# Patient Record
Sex: Male | Born: 1960
Health system: Southern US, Community
[De-identification: ages and names within clinical notes are randomized; demographics above are authoritative.]

## PROBLEM LIST (undated history)

## (undated) DIAGNOSIS — Z972 Presence of dental prosthetic device (complete) (partial): Secondary | ICD-10-CM

## (undated) DIAGNOSIS — F419 Anxiety disorder, unspecified: Secondary | ICD-10-CM

## (undated) DIAGNOSIS — I2510 Atherosclerotic heart disease of native coronary artery without angina pectoris: Secondary | ICD-10-CM

## (undated) DIAGNOSIS — I219 Acute myocardial infarction, unspecified: Secondary | ICD-10-CM

## (undated) DIAGNOSIS — I714 Abdominal aortic aneurysm, without rupture, unspecified: Secondary | ICD-10-CM

## (undated) DIAGNOSIS — E119 Type 2 diabetes mellitus without complications: Secondary | ICD-10-CM

## (undated) DIAGNOSIS — K219 Gastro-esophageal reflux disease without esophagitis: Secondary | ICD-10-CM

## (undated) DIAGNOSIS — I252 Old myocardial infarction: Secondary | ICD-10-CM

## (undated) DIAGNOSIS — Z6841 Body Mass Index (BMI) 40.0 and over, adult: Secondary | ICD-10-CM

## (undated) DIAGNOSIS — Z8679 Personal history of other diseases of the circulatory system: Secondary | ICD-10-CM

## (undated) DIAGNOSIS — E785 Hyperlipidemia, unspecified: Secondary | ICD-10-CM

## (undated) DIAGNOSIS — F32A Depression, unspecified: Secondary | ICD-10-CM

## (undated) DIAGNOSIS — G510 Bell's palsy: Secondary | ICD-10-CM

## (undated) DIAGNOSIS — I739 Peripheral vascular disease, unspecified: Secondary | ICD-10-CM

## (undated) DIAGNOSIS — M1990 Unspecified osteoarthritis, unspecified site: Secondary | ICD-10-CM

## (undated) DIAGNOSIS — Z973 Presence of spectacles and contact lenses: Secondary | ICD-10-CM

## (undated) DIAGNOSIS — Z794 Long term (current) use of insulin: Secondary | ICD-10-CM

## (undated) DIAGNOSIS — G4733 Obstructive sleep apnea (adult) (pediatric): Secondary | ICD-10-CM

## (undated) DIAGNOSIS — G894 Chronic pain syndrome: Secondary | ICD-10-CM

## (undated) DIAGNOSIS — Z9989 Dependence on other enabling machines and devices: Secondary | ICD-10-CM

## (undated) DIAGNOSIS — I10 Essential (primary) hypertension: Secondary | ICD-10-CM

## (undated) HISTORY — DX: Hyperlipidemia, unspecified: E78.5

## (undated) HISTORY — PX: DESCENDING AORTIC ANEURYSM REPAIR W/ STENT: SHX1456

---

## 2008-09-21 DIAGNOSIS — Z8679 Personal history of other diseases of the circulatory system: Secondary | ICD-10-CM

## 2008-09-21 HISTORY — DX: Personal history of other diseases of the circulatory system: Z86.79

## 2009-10-28 ENCOUNTER — Emergency Department (HOSPITAL_COMMUNITY): Admission: EM | Admit: 2009-10-28 | Discharge: 2009-10-28 | Payer: Self-pay | Admitting: Emergency Medicine

## 2009-11-28 ENCOUNTER — Emergency Department (HOSPITAL_COMMUNITY): Admission: EM | Admit: 2009-11-28 | Discharge: 2009-11-28 | Payer: Self-pay | Admitting: Emergency Medicine

## 2009-12-10 ENCOUNTER — Ambulatory Visit: Payer: Self-pay | Admitting: Family Medicine

## 2009-12-10 DIAGNOSIS — I10 Essential (primary) hypertension: Secondary | ICD-10-CM | POA: Insufficient documentation

## 2009-12-10 DIAGNOSIS — I719 Aortic aneurysm of unspecified site, without rupture: Secondary | ICD-10-CM | POA: Insufficient documentation

## 2009-12-10 DIAGNOSIS — G5620 Lesion of ulnar nerve, unspecified upper limb: Secondary | ICD-10-CM | POA: Insufficient documentation

## 2009-12-10 DIAGNOSIS — F329 Major depressive disorder, single episode, unspecified: Secondary | ICD-10-CM

## 2009-12-10 DIAGNOSIS — Z9889 Other specified postprocedural states: Secondary | ICD-10-CM | POA: Insufficient documentation

## 2009-12-10 DIAGNOSIS — F3289 Other specified depressive episodes: Secondary | ICD-10-CM | POA: Insufficient documentation

## 2009-12-10 DIAGNOSIS — IMO0002 Reserved for concepts with insufficient information to code with codable children: Secondary | ICD-10-CM | POA: Insufficient documentation

## 2009-12-11 DIAGNOSIS — J329 Chronic sinusitis, unspecified: Secondary | ICD-10-CM | POA: Insufficient documentation

## 2009-12-11 DIAGNOSIS — N529 Male erectile dysfunction, unspecified: Secondary | ICD-10-CM | POA: Insufficient documentation

## 2009-12-24 ENCOUNTER — Ambulatory Visit: Payer: Self-pay | Admitting: Internal Medicine

## 2009-12-24 LAB — CONVERTED CEMR LAB
ALT: 29 units/L (ref 0–53)
AST: 26 units/L (ref 0–37)
BUN: 16 mg/dL (ref 6–23)
CO2: 20 meq/L (ref 19–32)
Creatinine, Ser: 1.09 mg/dL (ref 0.40–1.50)
Eosinophils Absolute: 0.3 10*3/uL (ref 0.0–0.7)
Eosinophils Relative: 4 % (ref 0–5)
HCV Ab: NEGATIVE
Hep B S Ab: NEGATIVE
MCV: 91.1 fL (ref 78.0–100.0)
Monocytes Absolute: 0.4 10*3/uL (ref 0.1–1.0)
Monocytes Relative: 7 % (ref 3–12)
Platelets: 240 10*3/uL (ref 150–400)
Potassium: 4.5 meq/L (ref 3.5–5.3)
RDW: 13.9 % (ref 11.5–15.5)
Sodium: 138 meq/L (ref 135–145)
Total Bilirubin: 0.4 mg/dL (ref 0.3–1.2)

## 2010-01-17 ENCOUNTER — Ambulatory Visit: Payer: Self-pay | Admitting: Cardiothoracic Surgery

## 2010-01-21 ENCOUNTER — Encounter (INDEPENDENT_AMBULATORY_CARE_PROVIDER_SITE_OTHER): Payer: Self-pay | Admitting: Family Medicine

## 2010-01-23 ENCOUNTER — Ambulatory Visit: Payer: Self-pay | Admitting: Vascular Surgery

## 2010-03-13 ENCOUNTER — Telehealth (INDEPENDENT_AMBULATORY_CARE_PROVIDER_SITE_OTHER): Payer: Self-pay | Admitting: Family Medicine

## 2010-03-25 ENCOUNTER — Ambulatory Visit: Payer: Self-pay | Admitting: Family Medicine

## 2010-04-17 ENCOUNTER — Ambulatory Visit: Payer: Self-pay | Admitting: Cardiothoracic Surgery

## 2010-04-29 ENCOUNTER — Encounter (HOSPITAL_COMMUNITY): Admission: RE | Admit: 2010-04-29 | Discharge: 2010-06-21 | Payer: Self-pay | Admitting: Cardiology

## 2010-06-18 ENCOUNTER — Ambulatory Visit: Payer: Self-pay | Admitting: Internal Medicine

## 2010-07-18 ENCOUNTER — Ambulatory Visit: Payer: Self-pay | Admitting: Cardiothoracic Surgery

## 2010-07-18 ENCOUNTER — Encounter: Admission: RE | Admit: 2010-07-18 | Discharge: 2010-07-18 | Payer: Self-pay | Admitting: Cardiothoracic Surgery

## 2010-10-02 ENCOUNTER — Ambulatory Visit: Payer: Self-pay | Admitting: Cardiothoracic Surgery

## 2010-10-23 ENCOUNTER — Emergency Department (HOSPITAL_COMMUNITY): Admission: EM | Admit: 2010-10-23 | Discharge: 2010-10-23 | Payer: Self-pay | Admitting: Emergency Medicine

## 2010-11-25 ENCOUNTER — Encounter
Admission: RE | Admit: 2010-11-25 | Discharge: 2011-01-21 | Payer: Self-pay | Source: Home / Self Care | Attending: Family Medicine | Admitting: Family Medicine

## 2011-01-21 NOTE — Progress Notes (Signed)
Summary: Office Visit/DEPRESSION SCREENING  Office Visit/DEPRESSION SCREENING   Imported By: Arta Bruce 01/17/2010 12:39:47  _____________________________________________________________________  External Attachment:    Type:   Image     Comment:   External Document

## 2011-01-21 NOTE — Progress Notes (Signed)
Summary: nasal congestion  Phone Note Call from Patient   Summary of Call: Pt. complains of sinus congestion, afebrile, has been taking Sudafed without refief, encouraged to keep windows down @ home and up in the car, shower and wash hair after being outside for a period of time. May use saline nasal spray, netti pot, take showers and allow warm water to spray on sinus area, Drink plenty of water stop taking Sudfed pt has hx HTN. Instructed if no improvement or symptoms get worse call for appt.  Initial call taken by: Gaylyn Cheers RN,  March 13, 2010 4:28 PM

## 2011-01-21 NOTE — Letter (Signed)
Summary: CT REPORT/[facility]  CT REPORT/[facility]   Imported By: Leodis Rains 01/21/2010 16:56:12  _____________________________________________________________________  External Attachment:    Type:   Image     Comment:   External Document

## 2011-01-21 NOTE — Letter (Signed)
Summary: PT INFORMATION SHEET  PT INFORMATION SHEET   Imported By: Arta Bruce 01/17/2010 12:47:27  _____________________________________________________________________  External Attachment:    Type:   Image     Comment:   External Document

## 2011-01-21 NOTE — Letter (Signed)
Summary: SCALING SCALE FEE  SCALING SCALE FEE   Imported By: Arta Bruce 01/17/2010 12:42:08  _____________________________________________________________________  External Attachment:    Type:   Image     Comment:   External Document

## 2011-01-21 NOTE — Letter (Signed)
Summary: REFERRAL//PSYCHOLOGY/[facility]//APT DATE & TIME  REFERRAL//PSYCHOLOGY/[facility]//APT DATE & TIME   Imported By: Arta Bruce 01/28/2010 14:16:45  _____________________________________________________________________  External Attachment:    Type:   Image     Comment:   External Document

## 2011-03-26 LAB — POCT CARDIAC MARKERS: Troponin i, poc: <0.05 ng/mL (ref 0.00–0.09)

## 2011-03-26 LAB — POCT I-STAT, CHEM 8
BUN: 10 mg/dL (ref 6–23)
Calcium, Ion: 1.1 mmol/L — ABNORMAL LOW (ref 1.12–1.32)
Creatinine, Ser: 1.1 mg/dL (ref 0.4–1.5)
Glucose, Bld: 90 mg/dL (ref 70–99)
HCT: 42 % (ref 39.0–52.0)
Hemoglobin: 14.3 g/dL (ref 13.0–17.0)
Potassium: 3.8 mEq/L (ref 3.5–5.1)
Sodium: 139 mEq/L (ref 135–145)

## 2011-03-26 LAB — DIFFERENTIAL
Basophils Absolute: 0 10*3/uL (ref 0.0–0.1)
Basophils Relative: 1 % (ref 0–1)
Eosinophils Relative: 4 % (ref 0–5)
Lymphocytes Relative: 34 % (ref 12–46)
Lymphs Abs: 2.1 10*3/uL (ref 0.7–4.0)
Monocytes Absolute: 0.5 10*3/uL (ref 0.1–1.0)
Monocytes Relative: 8 % (ref 3–12)

## 2011-03-26 LAB — PROTIME-INR: INR: 0.96 (ref 0.00–1.49)

## 2011-03-26 LAB — CBC
MCHC: 34.3 g/dL (ref 30.0–36.0)
MCV: 92.6 fL (ref 78.0–100.0)
WBC: 6.3 10*3/uL (ref 4.0–10.5)

## 2011-05-06 NOTE — Assessment & Plan Note (Signed)
OFFICE VISIT   Bryan Wells, Bryan Wells  DOB:  28-Jun-1961                                        July 18, 2010  CHART #:  [id number]   HISTORY:  The patient returns with a 12-month followup CTA of the chest,  abdomen, and pelvis.  The patient originally presented to me in January  2011, after he had moved to a town.  Previously, had been treated by Dr.  Dannielle Huh in October.  At that time, he had presented with a type 3 aortic  dissection starting at the left subclavian and presented with a cold  left leg.  Dr. Dannielle Huh had placed stents in both iliacs.  He had had  intermittent followup since that time.  The patient comes in today for a  followup CT of his chronic aortic dissection.  His primary complaints  now are that he gets weak in both legs with ambulation and cramping.  Since last seen, he was referred to Cardiology, had a Cardiolite stress  test showed a normal ejection fraction without evidence of ischemia.  Vascular studies were done in February 2011, which showed ABIs on the  left of 0.8 and on the right of 0.7.   PHYSICAL EXAMINATION:  General:  The patient's blood pressure is 139/84,  pulse is 80, respiratory rate is 18, O2 sats 96%.  Lungs:  Clear  bilaterally.  Cardiac:  Regular rate and rhythm.  Abdomen:  Benign.  He  has bilateral palpable femoral pulses, pedal and posterior tibial pulses  are very weakly palpable bilaterally.   A CT scan of the chest shows a chronic aortic dissection originating at  the left subclavian spiralling down into the abdomen.  His bilateral  common iliacs stents are present.  He also has dilatation of his distal  abdominal aorta 43 x 39 mm.  The thoracic aorta was not significantly  enlarged.   IMPRESSION:  The patient was stable, descending thoracic aortic  dissection with ongoing question of claudication in the lower  extremities.  Discuss the findings of the CT with the patient and  stressed the importance of followup CT scans  in the future, in case  there is progressive enlargement of his thoracic aorta.  In addition, he  did have Doppler studies done in January, but I have made him  appointment for followup visit with the vascular surgeons in regard to  his iliac  stents and continued complaints of claudication in the lower  extremities.  Plan to see him back in 1 year with a CTA of the aorta.   Sheliah Plane, MD  Electronically Signed   EG/MEDQ  D:  07/18/2010  T:  07/19/2010  Job:  [id number]   cc:   Francisca December, M.D.

## 2011-05-06 NOTE — Consult Note (Signed)
NEW PATIENT CONSULTATION   Bryan Wells, Bryan Wells  DOB:  1961/01/26                                        January 17, 2010  CHART #:  [id number]   HISTORY:  The patient comes in today after recently moving to  [facility].  He had been seen at [facility] by Dr. Carolyne Fiscal.  The patient  is referred because of shortness of breath caused by an aneurysm.  The  patient does have a significant medical history, but unfortunately does  not have an aneurysm.  His history is complicated.  He presented to Dr.  Randa Evens at [facility] in early October 2009 with a descending  aortic dissection, presented with a cold left leg.  The patient  underwent stenting of the abdominal aorta and bilateral iliacs, had a  complicated course requiring tracheostomy and ventilation, but  ultimately recovered and saw Dr. Samule Ohm in followup.  From the notes, it  is a little unclear exactly where the dissection started and  unfortunately we do not have the films.  The best description noted that  it was a type B aortic dissection beginning just distal to the origin of  the innominate artery.  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  He was seen back by Dr. Samule Ohm at the end of December  2009.  At that time, he was doing well and he was going to see him for  followup CT in 6 months after that.  The patient did not have an  aneurysm noted, but an aortic dissection.  He continues to have some  weakness of his legs, fatigue in his legs with walking, and has been  unable to work.  He notes that he has been taking his blood pressure  medications.   PAST SURGICAL HISTORY:  Tracheostomy.   FAMILY HISTORY:  Father died at age 32 with diabetes, mother died of  bone cancer at age 26, brother died at age 33 with HIV, another brother  died at age 58 with end-stage renal disease and hypertension.  Two other  siblings are living, 3 children alive and well.   SOCIAL HISTORY:  The patient  applied for disability, had been turned  down, previously worked in [employer].  Lives with his cousin in  [place], divorced, former smoker, quit drinking alcohol in 1997.  Denies drug use.   PHYSICAL EXAMINATION:  General:  The patient is an obese black male, in  no distress.  Vital Signs:  His blood pressure 140/84 in the right arm,  pulse is 77, respiratory rate is 18, and O2 sats 96%.  Lungs:  The  patient has distant breath sounds bilaterally.  I do not appreciate any  murmur of aortic insufficiency.  Abdomen:  Moderately obese, unable to  palpate the abdominal aorta.  Extremities:  He has 2+ radial pulses  bilaterally and equal, 1+ DP and PT pulses bilaterally.   A CT scan done on the patient at [facility] in the fall of 2010 shows  that the maximum dilatation of the descending aorta is about 3 cm and  suggests there is a persistent flap, but no definite aneurysmal  dilatation.  Unfortunately, this study was done to rule out pulmonary  embolus, does not show the abdomen at all and the timing of the contrast  is  not adequate to fully evaluate the aorta.   IMPRESSION:  At this point, the patient probably has a persistent flap  in the descending aorta with a stent graft in the abdominal aorta.  Followup scan would both evaluate any progressive enlargement of the  thoracic aorta and also confirm the proper seating of the iliac stent  grafts.  At this point, I have stressed to the patient the importance of  his good blood pressure control.  He currently continues on diltiazem ER  120 mg 2 tablets twice a day, labetalol 100 mg 2 tablets 3 times a day,  lisinopril 20 mg b.i.d., and aspirin 81 mg a day.  In addition, he does  have dyspnea on exertion.  I would not attribute these symptoms to his  aorta and further evaluation for dyspnea on exertion should be carried  out by his medical doctor and consideration for referral to Cardiology.  I have made an appointment for the  patient to see me back in 6 months  with a CTA; properly contrasted CT scan of the chest, abdomen, and  pelvis.   Sheliah Plane, MD  Electronically Signed   EG/MEDQ  D:  01/17/2010  T:  01/18/2010  Job:  [id number]   cc:   Syliva Overman, MD

## 2011-06-11 ENCOUNTER — Other Ambulatory Visit: Payer: Self-pay | Admitting: Cardiothoracic Surgery

## 2011-06-11 DIAGNOSIS — I7100 Dissection of unspecified site of aorta: Secondary | ICD-10-CM

## 2011-07-10 ENCOUNTER — Other Ambulatory Visit: Payer: Self-pay

## 2011-07-10 ENCOUNTER — Ambulatory Visit
Admission: RE | Admit: 2011-07-10 | Discharge: 2011-07-10 | Disposition: A | Discharge status: Home / Self Care | Payer: Medicare Other | Source: Ambulatory Visit | Attending: Cardiothoracic Surgery | Admitting: Cardiothoracic Surgery

## 2011-07-10 ENCOUNTER — Ambulatory Visit (INDEPENDENT_AMBULATORY_CARE_PROVIDER_SITE_OTHER): Payer: Medicare Other | Admitting: Cardiothoracic Surgery

## 2011-07-10 DIAGNOSIS — I7100 Dissection of unspecified site of aorta: Secondary | ICD-10-CM

## 2011-07-10 DIAGNOSIS — I7103 Dissection of thoracoabdominal aorta: Secondary | ICD-10-CM

## 2011-07-10 MED ORDER — IOHEXOL 350 MG/ML SOLN
150.0000 mL | Freq: Once | INTRAVENOUS | Status: AC | PRN
  Administered 2011-07-10: 150 mL via INTRAVENOUS

## 2011-07-10 NOTE — Assessment & Plan Note (Signed)
OFFICE VISIT  Bryan Wells, GEATHERS DOB:  05/05/1961                                        July 10, 2011 CHART #:  [id number]  The patient returns to the office for a followup visit and a CT in followup.  The patient was originally treated for a type 3 aortic dissection and cold lower extremity with stenting of the abdominal aorta, by Dr. Dannielle Huh in [facility] in October 2009.  Postoperative course was complicated with requiring tracheostomy and prolonged ventilation.  The patient notes that ultimately, he was discharged home with leg weakness requiring walker, cane to ambulate.  Over the time, he has steadily improved.  He was referred by [facility] for followup to see me in January 2011, when he moved to [place].  He continues taking his medications appropriately and appears to have improved blood pressure control.  He continues to see Dr. Jacinto Halim, notes that he had a cardiac catheterization approximately 6 months ago.  On exam today, his blood pressure is 113/65, pulse is 90, respiratory rate 20, O2 sats 95%.  He remains significantly overweight and did not appreciate any carotid bruits.  He has no murmur.  Abdominal exam reveals an obese abdomen, but without palpable masses.  He has equally palpable radial and brachial pulses bilaterally, femoral pulses bilaterally, and posterior tibial pulses and dorsalis pedis pulses bilaterally.  Followup CT scan shows no change in the previously known type 3 aortic dissection, a fusiform aneurysm of the infrarenal abdominal aorta with mural thrombus in aneurysm sac, aneurysm extensive involved both common iliacs.  There is some interval increase in the infrarenal fusiform aortic aneurysm 3-46 mm.  Overall, I encouraged the patient to continue at his attempts of weight loss and continue on control of his blood pressure.  CURRENT MEDICATIONS: 1. Diltiazem ER 120 mg 2 b.i.d. 2. Labetalol 100 mg t.i.d. 3. Lisinopril  20 mg b.i.d. -has been discontinuied 4. Aspirin 81 mg a day. 5. Prilosec 20 mg a day. 6. Flexeril 10 mg b.i.d. 7. Quinapril/HCTZ 20/25 a day. 8. Levitra 20 mg.   Overall, I see no changes in the thoracic aortic dissection that will require any operative invention and continues to need tight blood pressure control.  He is being followed in the Vascular Surgery Office for his abdominal aneurysm.  I will plan to see him back in 1 year with a followup CTA of the chest and abdomen.  Sheliah Plane, MD Electronically Signed  EG/MEDQ  D:  07/10/2011  T:  07/10/2011  Job:  [id number]  cc:   [facility] Dr. Loleta Chance Pamella Pert, MD

## 2011-12-26 ENCOUNTER — Encounter: Payer: Self-pay | Admitting: *Deleted

## 2011-12-26 ENCOUNTER — Emergency Department (HOSPITAL_COMMUNITY)
Admission: EM | Admit: 2011-12-26 | Discharge: 2011-12-26 | Disposition: A | Discharge status: Home / Self Care | Payer: Medicare Other | Attending: Emergency Medicine | Admitting: Emergency Medicine

## 2011-12-26 DIAGNOSIS — J069 Acute upper respiratory infection, unspecified: Secondary | ICD-10-CM

## 2011-12-26 DIAGNOSIS — IMO0001 Reserved for inherently not codable concepts without codable children: Secondary | ICD-10-CM | POA: Insufficient documentation

## 2011-12-26 DIAGNOSIS — M79605 Pain in left leg: Secondary | ICD-10-CM

## 2011-12-26 DIAGNOSIS — R51 Headache: Secondary | ICD-10-CM | POA: Insufficient documentation

## 2011-12-26 DIAGNOSIS — Z79899 Other long term (current) drug therapy: Secondary | ICD-10-CM | POA: Insufficient documentation

## 2011-12-26 DIAGNOSIS — M79609 Pain in unspecified limb: Secondary | ICD-10-CM | POA: Insufficient documentation

## 2011-12-26 DIAGNOSIS — R6889 Other general symptoms and signs: Secondary | ICD-10-CM | POA: Insufficient documentation

## 2011-12-26 DIAGNOSIS — R070 Pain in throat: Secondary | ICD-10-CM | POA: Insufficient documentation

## 2011-12-26 DIAGNOSIS — E669 Obesity, unspecified: Secondary | ICD-10-CM | POA: Insufficient documentation

## 2011-12-26 DIAGNOSIS — I10 Essential (primary) hypertension: Secondary | ICD-10-CM | POA: Insufficient documentation

## 2011-12-26 HISTORY — DX: Abdominal aortic aneurysm, without rupture: I71.4

## 2011-12-26 HISTORY — DX: Essential (primary) hypertension: I10

## 2011-12-26 HISTORY — DX: Abdominal aortic aneurysm, without rupture, unspecified: I71.40

## 2011-12-26 NOTE — ED Notes (Signed)
Pt  States that he has had intermittent left thigh cramping for the past week. States mainly at night. Denies cramping at this time but states is sore. States history of stent placement in that femoral vein/artery due to AAA causing a "cold leg" was done in 2009. Today his left leg is warm to touch. Pedal pulses and dorsalis pedis pulses are strong bilaterally. Legs are equally warm. Neg homens sign

## 2011-12-26 NOTE — ED Notes (Signed)
Reports cramping to left  Upper leg that started last night. Ambulatory at triage.

## 2011-12-26 NOTE — ED Provider Notes (Signed)
History    this is a 51 year old male with history of AAA, L leg stent, and obesity. Patient presents complaining of left thigh pain for the past 2 days. Pain is described as a cramping sensation. Pain is worsened with rest and improves with walking. Patient states he has been trying to lose weight and has been exercising more than usual. However, due to his history of AAA he would like to make sure that it's not related. Patient also complains of flulike symptoms including headache, sneezing, sore throat, and body aches. This has been ongoing for the past several days. He denies nausea vomiting or diarrhea. He denies chest pain, shortness of breath, abdominal pain, dysuria.  CSN: [id number]  Arrival date & time 12/26/11  1039   First MD Initiated Contact with Patient 12/26/11 1341      Chief Complaint  Patient presents with  . Leg Pain    (Consider location/radiation/quality/duration/timing/severity/associated sxs/prior treatment) HPI  Past Medical History  Diagnosis Date  . AAA (abdominal aortic aneurysm)   . Obesity   . Hypertension     History reviewed. No pertinent past surgical history.  History reviewed. No pertinent family history.  History  Substance Use Topics  . Smoking status: Not on file  . Smokeless tobacco: Not on file  . Alcohol Use: No      Review of Systems  All other systems reviewed and are negative.    Allergies  Penicillins  Home Medications   Current Outpatient Rx  Name Route Sig Dispense Refill  . DILTIAZEM HCL ER 240 MG PO CP24 Oral Take 240 mg by mouth 2 (two) times daily.      Marland Kitchen LABETALOL HCL 200 MG PO TABS Oral Take 300 mg by mouth 3 (three) times daily.      Marland Kitchen LISINOPRIL-HYDROCHLOROTHIAZIDE 20-25 MG PO TABS Oral Take 1 tablet by mouth daily.        BP 130/66  Pulse 94  Temp(Src) 98.2 F (36.8 C) (Oral)  Resp 18  SpO2 97%  Physical Exam  Nursing note and vitals reviewed. Constitutional: He is oriented to person, place, and  time.       Awake, alert, nontoxic appearance. Obese  HENT:  Head: Atraumatic.  Eyes: Right eye exhibits no discharge. Left eye exhibits no discharge.  Neck: Neck supple.  Cardiovascular: Normal rate and regular rhythm.   Pulmonary/Chest: Effort normal. No respiratory distress. He has no wheezes. He has no rales. He exhibits no tenderness.  Abdominal: Soft. There is no tenderness. There is no rebound.  Musculoskeletal: Normal range of motion. He exhibits no edema and no tenderness.       Baseline ROM, no obvious new focal weakness  Neurological: He is alert and oriented to person, place, and time.       Mental status and motor strength appears baseline for patient and situation  Skin: Skin is warm and dry. No rash noted.  Psychiatric: He has a normal mood and affect.    ED Course  Procedures (including critical care time)  Labs Reviewed - No data to display No results found.   No diagnosis found.    MDM  Pt's primary complaint is L thigh pain.  However, pain improves with ambulation, which makes it less likely to be claudication.  No pain with ROM, less likely musculoskeletal.  No pain to hip or L knee.  No calf pain or lower extremity swelling.  Pedal pulse 2+ bilaterally.  Skin warm to the touch, pt can ambulate  without discomfort.  I have discussed with my attending, who felt pt is safe to be discharge.  I recommend f/u with PCP.  I also give red flags signs for pt to be aware.  Pt voice understanding and agreeing with plan. I also encourage pt to return if sxs worsen.     No Ankle-Brachial index or Doppler study at this time.  Both of which i have discussed with my attending     Fayrene Helper, PA 12/26/11 1406  Fayrene Helper, PA 12/26/11 1409

## 2011-12-26 NOTE — Discharge Instructions (Signed)
Please followup with your doctor for further evaluation of your leg pain. Return to ER if you experience any fever, chest pain, shortness of breath, worsening leg pain, leg swelling, rash, or if you have any other concern.  Upper Respiratory Infection, Adult An upper respiratory infection (URI) is also sometimes known as the common cold. The upper respiratory tract includes the nose, sinuses, throat, trachea, and bronchi. Bronchi are the airways leading to the lungs. Most people improve within 1 week, but symptoms can last up to 2 weeks. A residual cough may last even longer.  CAUSES Many different viruses can infect the tissues lining the upper respiratory tract. The tissues become irritated and inflamed and often become very moist. Mucus production is also common. A cold is contagious. You can easily spread the virus to others by oral contact. This includes kissing, sharing a glass, coughing, or sneezing. Touching your mouth or nose and then touching a surface, which is then touched by another person, can also spread the virus. SYMPTOMS  Symptoms typically develop 1 to 3 days after you come in contact with a cold virus. Symptoms vary from person to person. They may include:  Runny nose.   Sneezing.   Nasal congestion.   Sinus irritation.   Sore throat.   Loss of voice (laryngitis).   Cough.   Fatigue.   Muscle aches.   Loss of appetite.   Headache.   Low-grade fever.  DIAGNOSIS  You might diagnose your own cold based on familiar symptoms, since most people get a cold 2 to 3 times a year. Your caregiver can confirm this based on your exam. Most importantly, your caregiver can check that your symptoms are not due to another disease such as strep throat, sinusitis, pneumonia, asthma, or epiglottitis. Blood tests, throat tests, and X-rays are not necessary to diagnose a common cold, but they may sometimes be helpful in excluding other more serious diseases. Your caregiver will decide  if any further tests are required. RISKS AND COMPLICATIONS  You may be at risk for a more severe case of the common cold if you smoke cigarettes, have chronic heart disease (such as heart failure) or lung disease (such as asthma), or if you have a weakened immune system. The very young and very old are also at risk for more serious infections. Bacterial sinusitis, middle ear infections, and bacterial pneumonia can complicate the common cold. The common cold can worsen asthma and chronic obstructive pulmonary disease (COPD). Sometimes, these complications can require emergency medical care and may be life-threatening. PREVENTION  The best way to protect against getting a cold is to practice good hygiene. Avoid oral or hand contact with people with cold symptoms. Wash your hands often if contact occurs. There is no clear evidence that vitamin C, vitamin E, echinacea, or exercise reduces the chance of developing a cold. However, it is always recommended to get plenty of rest and practice good nutrition. TREATMENT  Treatment is directed at relieving symptoms. There is no cure. Antibiotics are not effective, because the infection is caused by a virus, not by bacteria. Treatment may include:  Increased fluid intake. Sports drinks offer valuable electrolytes, sugars, and fluids.   Breathing heated mist or steam (vaporizer or shower).   Eating chicken soup or other clear broths, and maintaining good nutrition.   Getting plenty of rest.   Using gargles or lozenges for comfort.   Controlling fevers with ibuprofen or acetaminophen as directed by your caregiver.   Increasing usage of  your inhaler if you have asthma.  Zinc gel and zinc lozenges, taken in the first 24 hours of the common cold, can shorten the duration and lessen the severity of symptoms. Pain medicines may help with fever, muscle aches, and throat pain. A variety of non-prescription medicines are available to treat congestion and runny nose.  Your caregiver can make recommendations and may suggest nasal or lung inhalers for other symptoms.  HOME CARE INSTRUCTIONS   Only take over-the-counter or prescription medicines for pain, discomfort, or fever as directed by your caregiver.   Use a warm mist humidifier or inhale steam from a shower to increase air moisture. This may keep secretions moist and make it easier to breathe.   Drink enough water and fluids to keep your urine clear or pale yellow.   Rest as needed.   Return to work when your temperature has returned to normal or as your caregiver advises. You may need to stay home longer to avoid infecting others. You can also use a face mask and careful hand washing to prevent spread of the virus.  SEEK MEDICAL CARE IF:   After the first few days, you feel you are getting worse rather than better.   You need your caregiver's advice about medicines to control symptoms.   You develop chills, worsening shortness of breath, or Ducre or red sputum. These may be signs of pneumonia.   You develop yellow or Rhett nasal discharge or pain in the face, especially when you bend forward. These may be signs of sinusitis.   You develop a fever, swollen neck glands, pain with swallowing, or white areas in the back of your throat. These may be signs of strep throat.  SEEK IMMEDIATE MEDICAL CARE IF:   You have a fever.   You develop severe or persistent headache, ear pain, sinus pain, or chest pain.   You develop wheezing, a prolonged cough, cough up blood, or have a change in your usual mucus (if you have chronic lung disease).   You develop sore muscles or a stiff neck.  Document Released: 06/03/2001 Document Revised: 08/20/2011 Document Reviewed: 04/11/2011 [facility] Patient Information 2012 [place], [place].

## 2011-12-26 NOTE — ED Provider Notes (Signed)
Medical screening examination/treatment/procedure(s) were performed by non-physician practitioner and as supervising physician I was immediately available for consultation/collaboration. Jalisia Puchalski Y.   Gavin Pound. Inola Lisle, MD 12/26/11 1191

## 2012-06-10 ENCOUNTER — Other Ambulatory Visit: Payer: Self-pay | Admitting: Cardiothoracic Surgery

## 2012-06-10 DIAGNOSIS — I7101 Dissection of thoracic aorta: Secondary | ICD-10-CM

## 2012-06-10 DIAGNOSIS — I71019 Dissection of thoracic aorta, unspecified: Secondary | ICD-10-CM

## 2012-07-20 ENCOUNTER — Other Ambulatory Visit: Payer: Medicare Other

## 2012-07-22 ENCOUNTER — Ambulatory Visit: Payer: Medicare Other | Admitting: Cardiothoracic Surgery

## 2012-10-07 ENCOUNTER — Encounter (HOSPITAL_COMMUNITY): Payer: Self-pay | Admitting: Nurse Practitioner

## 2012-10-07 ENCOUNTER — Emergency Department (HOSPITAL_COMMUNITY)
Admission: EM | Admit: 2012-10-07 | Discharge: 2012-10-07 | Disposition: A | Discharge status: Home / Self Care | Payer: Medicare Other | Attending: Emergency Medicine | Admitting: Emergency Medicine

## 2012-10-07 DIAGNOSIS — E669 Obesity, unspecified: Secondary | ICD-10-CM | POA: Insufficient documentation

## 2012-10-07 DIAGNOSIS — X58XXXA Exposure to other specified factors, initial encounter: Secondary | ICD-10-CM | POA: Insufficient documentation

## 2012-10-07 DIAGNOSIS — T783XXA Angioneurotic edema, initial encounter: Secondary | ICD-10-CM

## 2012-10-07 DIAGNOSIS — I10 Essential (primary) hypertension: Secondary | ICD-10-CM | POA: Insufficient documentation

## 2012-10-07 LAB — CBC WITH DIFFERENTIAL/PLATELET
Basophils Absolute: 0.1 10*3/uL (ref 0.0–0.1)
Eosinophils Absolute: 0.3 10*3/uL (ref 0.0–0.7)
Monocytes Absolute: 0.6 10*3/uL (ref 0.1–1.0)
Monocytes Relative: 8 % (ref 3–12)
Neutro Abs: 3.3 10*3/uL (ref 1.7–7.7)
RDW: 12.5 % (ref 11.5–15.5)

## 2012-10-07 LAB — COMPREHENSIVE METABOLIC PANEL
AST: 30 U/L (ref 0–37)
BUN: 18 mg/dL (ref 6–23)
Creatinine, Ser: 1.38 mg/dL — ABNORMAL HIGH (ref 0.50–1.35)
GFR calc non Af Amer: 58 mL/min — ABNORMAL LOW (ref >90)
Glucose, Bld: 100 mg/dL — ABNORMAL HIGH (ref 70–99)
Potassium: 3.8 mEq/L (ref 3.5–5.1)
Sodium: 138 mEq/L (ref 135–145)
Total Bilirubin: 0.3 mg/dL (ref 0.3–1.2)

## 2012-10-07 MED ORDER — DIPHENHYDRAMINE HCL 50 MG/ML IJ SOLN
50.0000 mg | Freq: Once | INTRAMUSCULAR | Status: AC
  Administered 2012-10-07: 50 mg via INTRAVENOUS
  Filled 2012-10-07: qty 1

## 2012-10-07 MED ORDER — HYDROCHLOROTHIAZIDE 12.5 MG PO TABS: 25.0000 mg | ORAL_TABLET | Freq: Every day | ORAL | Status: DC

## 2012-10-07 MED ORDER — PREDNISONE 20 MG PO TABS: 40.0000 mg | ORAL_TABLET | Freq: Every day | ORAL | Status: DC

## 2012-10-07 MED ORDER — FAMOTIDINE IN NACL 20-0.9 MG/50ML-% IV SOLN
20.0000 mg | Freq: Once | INTRAVENOUS | Status: AC
  Administered 2012-10-07: 20 mg via INTRAVENOUS
  Filled 2012-10-07: qty 50

## 2012-10-07 MED ORDER — METHYLPREDNISOLONE SODIUM SUCC 125 MG IJ SOLR
125.0000 mg | Freq: Once | INTRAMUSCULAR | Status: AC
  Administered 2012-10-07: 125 mg via INTRAVENOUS
  Filled 2012-10-07: qty 2

## 2012-10-07 NOTE — ED Notes (Signed)
Pt A.O. X 4. Ambulatory. Mild swelling noted on upper lip, right side. Airway intact. Able to swallow with no difficulty. Ambulatory. Vitals stable.  Respirations even and regular. Skin warm and dry. NAD. Verbalized understanding of change in medication and administration of new prescription. No further questions at this time.

## 2012-10-07 NOTE — ED Notes (Signed)
Airway patent, no s/s of resp. Distress.  Pt. Swallowing without difficulty.  Pt. 's upper lip is swollen, but no other swelling noted to pt.s tongue or lower lip.

## 2012-10-07 NOTE — ED Notes (Signed)
Pt states at 0800 today onset lip swelling. No new meds or products but does take lisinopril. No trouble swallowing or breathing. A&Ox4, resp e/u

## 2012-10-07 NOTE — ED Provider Notes (Signed)
Medical screening examination/treatment/procedure(s) were performed by non-physician practitioner and as supervising physician I was immediately available for consultation/collaboration.   Richardean Canal, MD 10/07/12 [phone]

## 2012-10-07 NOTE — ED Notes (Signed)
Pt still has mild swelling to the upper lip. Pt denies pain, sob.

## 2012-10-07 NOTE — ED Notes (Signed)
Pt presents with onset of upper lip swelling that he first noted this morning.  Pt reports feeling that lip is getting bigger, denies any difficulty swallowing or swelling of tongue.

## 2012-10-07 NOTE — ED Provider Notes (Signed)
Reports received from Dr Silverio Lay.  Pt to come to CDU under observation, angioedema protocol.  Pt is on lisinopril-HCTZ.  When patient is improved and stable, pt may be d/c home lisinopril-HCTZ to be discontinued, pt to discharged with prescription for HCTZ 25mg .  I have discussed this with Emilia Beck, PA-C, who assumes care of patient upon his arrival to CDU.    [place], [place] 10/07/12 1530

## 2012-10-07 NOTE — ED Provider Notes (Signed)
History   This chart was scribed for Bryan Canal, MD by Charolett Bumpers . The patient was seen in room TR05C/TR05C. Patient's care was started at 1422.   CSN: [id number] Arrival date & time 10/07/12  1353  First MD Initiated Contact with Patient 10/07/12 1422      Chief Complaint  Patient presents with  . Oral Swelling    The history is provided by the patient. No language interpreter was used.   Shahan Starks is a 51 y.o. male who presents to the Emergency Department complaining of constant, moderate, worsening facial swelling. He states that swelling is mainly to his upper lip. He states he noticed the swelling around 8 am this morning.He denies any difficulty breathing or swallowing. He denies any tongue swelling. He has a h/o HTN and has been on lisinopril since 2009. He denies any new medications.   Past Medical History  Diagnosis Date  . AAA (abdominal aortic aneurysm)   . Obesity   . Hypertension     History reviewed. No pertinent past surgical history.  History reviewed. No pertinent family history.  History  Substance Use Topics  . Smoking status: Not on file  . Smokeless tobacco: Not on file  . Alcohol Use: No      Review of Systems  Constitutional: Negative for fever and chills.  HENT: Positive for facial swelling.   Respiratory: Negative for shortness of breath.   Gastrointestinal: Negative for nausea and vomiting.  Neurological: Negative for weakness.  All other systems reviewed and are negative.    Allergies  Penicillins  Home Medications   Current Outpatient Rx  Name Route Sig Dispense Refill  . DILTIAZEM HCL ER 240 MG PO CP24 Oral Take 240 mg by mouth 2 (two) times daily.      Marland Kitchen LABETALOL HCL 200 MG PO TABS Oral Take 300 mg by mouth 3 (three) times daily.      Marland Kitchen LISINOPRIL-HYDROCHLOROTHIAZIDE 20-25 MG PO TABS Oral Take 1 tablet by mouth daily.        BP 115/68  Pulse 83  Temp 99.2 F (37.3 C) (Oral)  Resp 20  SpO2 99%  Physical  Exam  Nursing note and vitals reviewed. Constitutional: He is oriented to person, place, and time. He appears well-developed and well-nourished. No distress.       NAD  HENT:  Head: Normocephalic and atraumatic.  Mouth/Throat: Oropharynx is clear and moist. No oropharyngeal exudate.       Obvious swelling to upper lip. No swelling in oropharynx swelling.   Eyes: EOM are normal. Pupils are equal, round, and reactive to light.  Neck: Normal range of motion. Neck supple. No tracheal deviation present.       No stridor or drooling  Cardiovascular: Normal rate, regular rhythm and normal heart sounds.   No murmur heard. Pulmonary/Chest: Effort normal and breath sounds normal. No stridor. No respiratory distress. He has no wheezes. He has no rales.  Abdominal: Soft. He exhibits no distension.  Musculoskeletal: Normal range of motion. He exhibits no edema.  Neurological: He is alert and oriented to person, place, and time.  Skin: Skin is warm and dry.  Psychiatric: He has a normal mood and affect. His behavior is normal.    ED Course  Procedures (including critical care time)  DIAGNOSTIC STUDIES: Oxygen Saturation is 99% on room air, normal by my interpretation.    COORDINATION OF CARE:  14:25-Discussed planned course of treatment with the patient including stopping the lisinopril  and switching do a different HTN medication and giving Pepcid, Benadryl and Solu-Medrol, who is agreeable at this time.   14:30-Medication Orders: Methylprednisolone sodium succinate (Solu-medrol) 125 mg/2 mL injection 125 mg-once; Diphenhydramine (Benadryl) injection 50 mg-once; Famotidine (Pepcid) IVPB 20 mg-once   Labs Reviewed  CBC WITH DIFFERENTIAL  COMPREHENSIVE METABOLIC PANEL   No results found.   1. Angioedema of lips       MDM  Bryan Wells is a 51 y.o. male here with angioedema of upper lip. No stridor or OP or tongue involvement. He was given solumedrol, benadryl, and pepcid. He will be  observed in the CDU until the swelling improves. Then he can be d/c home on HCTZ and lisinopril will be d/c. I gave sign out to CDU PA.     This document was completed by the scribe at my direction and I have reviewed its accuracy. I have personally examined the patient and agrees with the above document.   Chaney Malling, MD       Bryan Canal, MD 10/07/12 [phone]

## 2012-10-07 NOTE — ED Provider Notes (Signed)
3:36 PM Patient with a hx sig for HTN, AAA, and obesity was placed in CDU on angioedema protocol by Dr Silverio Lay. Patient care resumed from [facility], [place] .  Patient is here for angioedema and has received prednisone, benadryl and pepcid. While in obeservation over night the pt slept well and had no complaints, per nursing staff. Patient re-evaluated and is resting comfortable, VSS, with no new complaints or concerns at this time. Plan per previous provider is to observe for worsening swelling. Patient will be discharged upon improvement of lip swelling. On exam: hemodynamically stable, NAD, heart w/ RRR, lungs CTAB, Chest & abd non-tender, no peripheral edema or calf tenderness. Patient denies SOB, wheezing, throat closing.    5:43 PM Patient's lip swelling appears mildly improved. Patient given an additional dose of benadryl and pepcid. Patient reports feeling better.   8:11 PM Patient's edema is continuing to resolve. He is ready to go home. He denies SOB, wheezing, throat closing. His lungs are CTA bilaterally and heart has RRR. I will discharge him with a 5 day course of prednisone. He should return with any worsening or concerning symptoms.   Patient instructed to discontinue current lisinopril-HCTZ and start taking 25mg  HCTZ as prescribed here. He has agreed to follow up with his doctor for further medication adjustments.   Filed Vitals:   10/07/12 1808  BP: 148/65  Pulse: 75  Temp: 99.1 F (37.3 C)  Resp: [address] PA-C 10/07/12 2021

## 2012-10-11 NOTE — ED Provider Notes (Signed)
Medical screening examination/treatment/procedure(s) were performed by non-physician practitioner and as supervising physician I was immediately available for consultation/collaboration.   Richardean Canal, MD 10/11/12 [phone]

## 2013-06-15 ENCOUNTER — Other Ambulatory Visit: Payer: Self-pay | Admitting: *Deleted

## 2013-06-15 DIAGNOSIS — I7101 Dissection of thoracic aorta: Secondary | ICD-10-CM

## 2013-06-15 DIAGNOSIS — I71019 Dissection of thoracic aorta, unspecified: Secondary | ICD-10-CM

## 2013-07-05 ENCOUNTER — Other Ambulatory Visit: Payer: Medicare Other

## 2013-07-05 ENCOUNTER — Ambulatory Visit: Payer: Medicare Other | Admitting: Cardiothoracic Surgery

## 2013-07-21 ENCOUNTER — Ambulatory Visit: Payer: Medicare Other | Admitting: Cardiothoracic Surgery

## 2013-07-28 ENCOUNTER — Other Ambulatory Visit: Payer: Medicare Other

## 2013-07-28 ENCOUNTER — Ambulatory Visit: Payer: Medicare Other | Admitting: Cardiothoracic Surgery

## 2013-08-25 ENCOUNTER — Ambulatory Visit: Payer: Medicare Other | Admitting: Cardiothoracic Surgery

## 2013-08-25 ENCOUNTER — Other Ambulatory Visit: Payer: Medicare Other

## 2014-06-20 ENCOUNTER — Other Ambulatory Visit: Payer: Self-pay | Admitting: Internal Medicine

## 2014-06-20 DIAGNOSIS — I7101 Dissection of thoracic aorta: Secondary | ICD-10-CM

## 2014-06-20 DIAGNOSIS — I71019 Dissection of thoracic aorta, unspecified: Secondary | ICD-10-CM

## 2014-06-28 ENCOUNTER — Ambulatory Visit
Admission: RE | Admit: 2014-06-28 | Discharge: 2014-06-28 | Disposition: A | Discharge status: Home / Self Care | Payer: Medicare Other | Source: Ambulatory Visit | Attending: Internal Medicine | Admitting: Internal Medicine

## 2014-06-28 ENCOUNTER — Ambulatory Visit: Payer: Medicare Other | Admitting: Cardiothoracic Surgery

## 2014-06-28 DIAGNOSIS — I7101 Dissection of thoracic aorta: Secondary | ICD-10-CM

## 2014-06-28 DIAGNOSIS — I71019 Dissection of thoracic aorta, unspecified: Secondary | ICD-10-CM

## 2014-06-28 MED ORDER — IOHEXOL 350 MG/ML SOLN
100.0000 mL | Freq: Once | INTRAVENOUS | Status: AC | PRN
Start: 2014-06-28 — End: 2014-06-28
  Administered 2014-06-28: 100 mL via INTRAVENOUS

## 2014-07-04 ENCOUNTER — Ambulatory Visit (INDEPENDENT_AMBULATORY_CARE_PROVIDER_SITE_OTHER): Payer: Medicare Other | Admitting: Cardiothoracic Surgery

## 2014-07-04 VITALS — BP 134/77 | HR 84 | Resp 20 | Ht 71.0 in | Wt 315.0 lb

## 2014-07-04 DIAGNOSIS — I71019 Dissection of thoracic aorta, unspecified: Secondary | ICD-10-CM

## 2014-07-04 DIAGNOSIS — I7101 Dissection of thoracic aorta: Secondary | ICD-10-CM

## 2014-07-04 NOTE — Progress Notes (Signed)
[address]       [place] [address]             [phone]                    Ihor Meinzer [facility] Medical Record #[id number] Date of Birth: [birth year]  Referring: Katy Apo, MD Primary Care: Default, Provider, MD  Chief Complaint:    Chief Complaint  Patient presents with  . Thoracic Aortic Dissection    F/U to discuss CTA Chest/ABD/Pelvis 06/28/14    History of Present Illness:    Bryan Wells 53 y.o. male is seen in the office for follow up of aortic dissection. I last saw the patient  July 19,2012. He was org inially treated for Type III aortic dissection with ischemic lower extremities by stenting the abdominal aorta by Dr [facility] oct 2009. The post op course was complicated requiring tracheostomy and prolonged ventilation. He was discharged with leg weakness  Which gradually improved.  After 2012 the pateitn did not return for follow up. He is referred back by Dr Nehemiah Settle and a repeat cta of chest and abdomen has been done. Patient notes he is doing better, and trying to keep bp controlled.  complains of left groin pain  with ambulation    Current Activity/ Functional Status:  Patient is independent with mobility/ambulation, transfers, ADL's, IADL's.   Zubrod Score: At the time of surgery this patient's most appropriate activity status/level should be described as: [x]     0    Normal activity, no symptoms []     1    Restricted in physical strenuous activity but ambulatory, able to do out light work []     2    Ambulatory and capable of self care, unable to do work activities, up and about               >50 % of waking hours                              []     3    Only limited self care, in bed greater than 50% of waking hours []     4    Completely disabled, no self care, confined to bed or chair []     5    Moribund   Past Medical History  Diagnosis Date  . AAA (abdominal aortic aneurysm)   . Obesity   . Hypertension      past surgical history : Tracheostomy Stenting of iliac arteries  Past Family history: Father died age 51 with DM Mother died bone cancer age 78 brither died 46 HIV One brother with ESRD  History   Social History  . Marital Status: Divorced    Spouse Name: N/A    Number of Children: N/A  . Years of Education: N/A   Occupational History  .  applied for disability in 2012   Social History Main Topics  . Smoking status: Not on file  . Smokeless tobacco: Not on file  . Alcohol Use: No  . Drug Use: No  . Sexual Activity:       History  Smoking status  . Not on file  Smokeless tobacco  . Not on file    History  Alcohol Use No     Allergies  Allergen Reactions  . Penicillins Other (See Comments)    Loss of consciousness  Current Outpatient Prescriptions  Medication Sig Dispense Refill  . diltiazem (DILACOR XR) 240 MG 24 hr capsule Take 240 mg by mouth daily.       Marland Kitchen labetalol (NORMODYNE) 200 MG tablet Take 300 mg by mouth 2 (two) times daily.       Marland Kitchen omeprazole (PRILOSEC) 20 MG capsule Take 20 mg by mouth daily.      . [DISCONTINUED] lisinopril-hydrochlorothiazide (PRINZIDE,ZESTORETIC) 20-25 MG per tablet Take 1 tablet by mouth daily.         No current facility-administered medications for this visit.     Review of Systems:     Cardiac Review of Systems: Y or N  Chest Pain [ n   ]  Resting SOB [n   ] Exertional SOB  [ n ]  Orthopnea [n  ]   Pedal Edema [n ]    Palpitations Milo.Brash  ] Syncope n ]   Presyncope [n ]  General Review of Systems: [Y] = yes [  ]=no Constitional: recent weight change [  ];  Wt loss over the last 3 months [   ] anorexia [  ]; fatigue [  ]; nausea [  ]; night sweats [  ]; fever [  ]; or chills [  ];          Dental: poor dentition[  ]; Last Dentist visit:   Eye : blurred vision [  ]; diplopia [   ]; vision changes [  ];  Amaurosis fugax[  ]; Resp: cough [  ];  wheezing[  ];  hemoptysis[  ]; shortness of breath[  ]; paroxysmal nocturnal  dyspnea[  ]; dyspnea on exertion[  ]; or orthopnea[  ];  GI:  gallstones[  ], vomiting[  ];  dysphagia[  ]; melena[  ];  hematochezia [  ]; heartburn[  ];   Hx of  Colonoscopy[  ]; GU: kidney stones [  ]; hematuria[  ];   dysuria [  ];  nocturia[  ];  history of     obstruction [  ]; urinary frequency [  ]             Skin: rash, swelling[  ];, hair loss[  ];  peripheral edema[  ];  or itching[  ]; Musculosketetal: myalgias[  ];  joint swelling[  ];  joint erythema[  ];  joint pain[  ];  back pain[  ];  Heme/Lymph: bruising[  ];  bleeding[  ];  anemia[  ];  Neuro: TIA[  ];  headaches[  ];  stroke[n  ];  vertigo[ n ];  seizures[n  ];   paresthesias[  ];  difficulty walking[  ];  Psych:depression[  ]; anxiety[  ];  Endocrine: diabetes[n  ];  thyroid dysfunction[ n ];  Immunizations: Flu up to date [?  ]; Pneumococcal up to date [?  ];  Other:  Physical Exam: BP 134/77  Pulse 84  Resp 20  Ht 5\' 11"  (1.803 m)  Wt 315 lb (142.883 kg)  BMI 43.95 kg/m2  SpO2 97%  PHYSICAL EXAMINATION:  General appearance: alert, cooperative, appears stated age and no distress Neurologic: intact Heart: regular rate and rhythm, S1, S2 normal, no murmur, click, rub or gallop Lungs: clear to auscultation bilaterally Abdomen: soft, non-tender; bowel sounds normal; no masses,  no organomegaly Extremities: extremities normal, atraumatic, no cyanosis or edema, Homans sign is negative, no sign of DVT and palpable femoral, dp and pt pulses bilaterial   Diagnostic Studies & Laboratory data:  Recent Radiology Findings:   Ct Angio Chest Aorta W/cm &/or Wo/cm  06/28/2014   CLINICAL DATA:  Thoracic aortic dissection.  EXAM: CT ANGIOGRAPHY CHEST, ABDOMEN AND PELVIS  TECHNIQUE: Multidetector CT imaging through the chest, abdomen and pelvis was performed using the standard protocol during bolus administration of intravenous contrast. Multiplanar reconstructed images and MIPs were obtained and reviewed to evaluate the  vascular anatomy.  CONTRAST:  OMNIPAQUE IOHEXOL 350 MG/ML SOLN  COMPARISON:  CT scan of July 18, 2010.  FINDINGS: CTA CHEST FINDINGS  No pneumothorax or pleural effusion is noted. No acute pulmonary disease is noted. No mediastinal mass or adenopathy is noted. The ascending aorta appears normal in caliber without dissection. Aortic dissection is noted beginning distal to the left subclavian artery consistent with type B dissection. This is stable compared to prior exam. Dilatation of the proximal descending thoracic aorta is noted measuring 4.5 x 3.8 cm, which is slightly increased in size compared to prior exam. The true lumen extends along the medial aspect of the thoracic aorta to the level of the hiatus. This is unchanged compared to prior exam.  Review of the MIP images confirms the above findings.  CTA ABDOMEN AND PELVIS FINDINGS  There is also noted continuation of the dissection into the abdominal aorta, with the celiac and superior mesenteric arteries arising from the true lumen, as well as the left renal artery. The right renal artery arises from the false lumen. These findings are unchanged compared to prior exam. Inferior mesenteric artery also arises from the false lumen. Enlargement of infrarenal abdominal aortic aneurysm is noted which measures 4.7 cm in the AP direction and 4.7 cm in the transverse direction. Stents are again noted in the true lumen of the distal portion of the descending thoracic aorta extending into both common iliac arteries. The iliac arteries are widely patent without significant aneurysm formation. The liver, spleen and pancreas appear normal. No gallstones are noted. Adrenal glands appear normal. Bilateral renal cysts are noted. No hydronephrosis or renal obstruction is noted. No evidence of bowel obstruction is noted. Urinary bladder appears normal. The appendix appears normal.  Review of the MIP images confirms the above findings.  IMPRESSION: Type B dissection of  thoracic aorta is noted which extends into abdominal aorta to the level of the aortic bifurcation and slightly into the right common iliac artery. There does appear to be increased dilatation of the false lumen of the dissection in the proximal descending thoracic aorta compared to prior exam, now measuring 4.5 cm in maximum diameter. There also appears to be increased dilatation of the infrarenal abdominal aortic aneurysm, predominantly composed of false lumen, now measuring 4.7 cm.   Electronically Signed   By: Roque Lias M.D.   On: 06/28/2014 12:44      Recent Lab Findings: Lab Results  Component Value Date   WBC 7.3 10/07/2012   HGB 12.7* 10/07/2012   HCT 36.0* 10/07/2012   PLT 233 10/07/2012   GLUCOSE 100* 10/07/2012   ALT 31 10/07/2012   AST 30 10/07/2012   NA 138 10/07/2012   K 3.8 10/07/2012   CL 104 10/07/2012   CREATININE 1.38* 10/07/2012   BUN 18 10/07/2012   CO2 23 10/07/2012   TSH 1.065 12/24/2009   INR 0.96 10/28/2009   HGBA1C 6.1 12/24/2009   Chronic Kidney Disease   Stage I     GFR >90  Stage II    GFR 60-89  Stage IIIA GFR 45-59  Stage IIIB  GFR 30-44  Stage IV   GFR 15-29  Stage V    GFR  <15  Lab Results  Component Value Date   CREATININE 1.38* 10/07/2012   Estimated Creatinine Clearance: 90.6 ml/min (by C-G formula based on Cr of 1.38).    Assessment / Plan:   Chronic Type III aortic dissection  Since OCT 2009, now with slight enlargement of the thoracic aortic  and abdominal aortic aneurysm. Will arrange for Dr Myra GianottiBrabham /Vascular surgery to also see patient due to the abdominal aneurysm.  I have discussed with the patent the need to return for follow up visits.  Will plan at minimum follow up scan CTA chest abdomen pelvis in one year, will coordinate with vascular surgery after he has been seen      I spent 40 minutes counseling the patient face to face. The total time spent in the appointment was 80 minutes.  Delight OvensEdward B Reality Dejonge MD      [address] [place] [id number] Office [phone]   Beeper [phone]  07/04/2014 4:47 PM

## 2014-07-09 ENCOUNTER — Encounter: Payer: Self-pay | Admitting: Cardiothoracic Surgery

## 2014-07-27 ENCOUNTER — Ambulatory Visit: Payer: Medicare Other | Admitting: Cardiothoracic Surgery

## 2014-08-04 ENCOUNTER — Encounter: Payer: Self-pay | Admitting: Surgery

## 2014-08-05 ENCOUNTER — Emergency Department (HOSPITAL_COMMUNITY)
Admission: EM | Admit: 2014-08-05 | Discharge: 2014-08-05 | Disposition: A | Discharge status: Home / Self Care | Payer: Medicare Other | Attending: Emergency Medicine | Admitting: Emergency Medicine

## 2014-08-05 DIAGNOSIS — Z880 Allergy status to penicillin: Secondary | ICD-10-CM | POA: Diagnosis not present

## 2014-08-05 DIAGNOSIS — Z79899 Other long term (current) drug therapy: Secondary | ICD-10-CM | POA: Diagnosis not present

## 2014-08-05 DIAGNOSIS — E669 Obesity, unspecified: Secondary | ICD-10-CM | POA: Diagnosis not present

## 2014-08-05 DIAGNOSIS — K0889 Other specified disorders of teeth and supporting structures: Secondary | ICD-10-CM

## 2014-08-05 DIAGNOSIS — K089 Disorder of teeth and supporting structures, unspecified: Secondary | ICD-10-CM | POA: Diagnosis not present

## 2014-08-05 DIAGNOSIS — I10 Essential (primary) hypertension: Secondary | ICD-10-CM | POA: Insufficient documentation

## 2014-08-05 MED ORDER — CLINDAMYCIN HCL 150 MG PO CAPS: 450.0000 mg | ORAL_CAPSULE | Freq: Three times a day (TID) | ORAL | Status: DC

## 2014-08-05 MED ORDER — TRAMADOL HCL 50 MG PO TABS
50.0000 mg | ORAL_TABLET | Freq: Four times a day (QID) | ORAL | Status: DC | PRN
Start: 2014-08-05 — End: 2019-02-15

## 2014-08-05 NOTE — ED Provider Notes (Signed)
CSN: [id number]     Arrival date & time 08/05/14  2151 History  This chart was scribed for Santiago Glad, PA-C, working with Samuel Jester, DO by Chestine Spore, ED Scribe. The patient was seen in room TR05C/TR05C at 10:11 PM.     Chief Complaint  Patient presents with  . Dental Pain      The history is provided by the patient. No language interpreter was used.   HPI Comments: Bryan Wells is a 53 y.o. male with a medical hx of AAA, HTN, and obesity who presents to the Emergency Department complaining of left upper dental pain onset 3 days ago. He states that 1.5 months ago he had a filling that came out. He rates the pain as throbbing.  He states that he has tried IBU and orajel with temporary relief for his symptoms. He denies fever, chills, facial swelling, difficulty swallowing and any other associated symptoms. He denies having a dentist and was hoping for a referral. He states that he was informed not to take IBU because he has AAA. He states that tylenol does not work for him.   Past Medical History  Diagnosis Date  . AAA (abdominal aortic aneurysm)   . Obesity   . Hypertension    No past surgical history on file. No family history on file. History  Substance Use Topics  . Smoking status: Never Smoker   . Smokeless tobacco: Not on file  . Alcohol Use: No    Review of Systems  Constitutional: Negative for fever and chills.  HENT: Positive for dental problem (left upper ).       Allergies  Penicillins  Home Medications   Prior to Admission medications   Medication Sig Start Date End Date Taking? Authorizing Provider  diltiazem (DILACOR XR) 240 MG 24 hr capsule Take 240 mg by mouth daily.     Historical Provider, MD  labetalol (NORMODYNE) 200 MG tablet Take 300 mg by mouth 2 (two) times daily.     Historical Provider, MD  omeprazole (PRILOSEC) 20 MG capsule Take 20 mg by mouth daily.    Historical Provider, MD   BP 141/74  Pulse 76  Temp(Src) 98.8 F (37.1 C)  (Oral)  Resp 20  Ht 6' (1.829 m)  Wt 320 lb (145.151 kg)  BMI 43.39 kg/m2  SpO2 99%  Physical Exam  Nursing note and vitals reviewed. Constitutional: He is oriented to person, place, and time. He appears well-developed and well-nourished. No distress.  HENT:  Head: Normocephalic and atraumatic.  Mouth/Throat: No trismus in the jaw. No dental abscesses.  Tooth 2 is missing a filling. Mild tenderness to palpation of the right upper gingiva. No sublingual tenderness or swelling. No submental or submandibular lymphadenopathy.   Eyes: EOM are normal.  Neck: Normal range of motion. Neck supple. No tracheal deviation present.  Cardiovascular: Normal rate, regular rhythm and normal heart sounds.   Pulmonary/Chest: Effort normal and breath sounds normal. No respiratory distress.  Musculoskeletal: Normal range of motion.  Neurological: He is alert and oriented to person, place, and time.  Skin: Skin is warm and dry.  Psychiatric: He has a normal mood and affect. His behavior is normal.    ED Course  Procedures (including critical care time) DIAGNOSTIC STUDIES: Oxygen Saturation is 99% on room air, normal by my interpretation.    COORDINATION OF CARE: 10:16 PM-Discussed treatment plan which includes Clindamycin and Ultram with pt at bedside and pt agreed to plan.   Labs Review Labs  Reviewed - No data to display  Imaging Review No results found.   EKG Interpretation None      MDM   Final diagnoses:  None   Patient with toothache.  No gross abscess.  Exam unconcerning for Ludwig's angina or spread of infection.  Urged patient to follow-up with dentist.  Return precautions given.   I personally performed the services described in this documentation, which was scribed in my presence. The recorded information has been reviewed and is accurate.    Santiago GladHeather Guy Toney, PA-C 08/05/14 2252

## 2014-08-05 NOTE — ED Notes (Signed)
Pt states left upper filling fell out of one of his molar teeth, last 3 days pain is unbearable. Last dose of Ibuprofen 400 mg @ 3pm.

## 2014-08-05 NOTE — Discharge Instructions (Signed)
You have a dental injury. Use the resource guide listed below to help you find a dentist if you do not already have one to followup with. It is very important that you get evaluated by a dentist as soon as possible. Call tomorrow to schedule an appointment. Use your pain medication as prescribed and do not operate heavy machinery while on pain medication. Take your full course of antibiotics. Read the instructions below. ° °Eat a soft or liquid diet and rinse your mouth out after meals with warm water. You should see a dentist or return here at once if you have increased swelling, increased pain or uncontrolled bleeding from the site of your injury. ° ° °SEEK MEDICAL CARE IF:  °· You have increased pain not controlled with medicines.  °· You have swelling around your tooth, in your face or neck.  °· You have bleeding which starts, continues, or gets worse.  °· You have a fever >101 °· If you are unable to open your mouth ° °RESOURCE GUIDE ° °Dental Problems ° °Patients with Medicaid: °[facility]                     [facility] °[address]                                           [address] °Phone:  [phone]                                                  Phone:  [phone] ° °If unable to pay or uninsured, contact:  [facility] or [facility] to become qualified for the adult dental clinic. ° °Chronic Pain Problems °Contact [facility]  [phone] °Patients need to be referred by their primary care doctor. ° °Insufficient Money for Medicine °Contact [employer]:  call "211" or [facility] 271-5999. ° °No Primary Care Doctor °Call [facility]  832-8000 °Other agencies that provide inexpensive medical care °   [facility]  [phone] °   [facility]  [phone] °   [facility]  [phone] °   [facility] Clinic  [phone] °   [facility]  [phone] °   [facility]   [phone] ° °Psychological Services °[facility]  [phone] °[facility]  [phone] °[facility]   [phone] (emergency services [phone]) ° °Substance Abuse Resources °[facility]  336-882-2125 °[facility] 336-784-9470 °[facility] 336-285-9073 °[facility] 336-845-3988 °Residential & Outpatient Substance Abuse Program  800-659-3381 ° °Abuse/Neglect °[place] [facility] (336) 641-3795 °[place] [facility] Hotline 800-378-5315 (After Hours) ° °Emergency Shelter °[facility] (336) 271-5985 ° °Maternity Homes °[facility] (336) 275-9566 °[facility] (704) 372-4663 ° °MRSA Hotline #:   832-7006 ° ° ° °[place] [facility] ° °[facility] [facility]     [employer]                          [facility] °[address] [place]                         [address]      [address]  °[place]                                                [place]                            [place] °Phone:  349-3220                                   Phone:  [phone]                 Phone:  342-8140 ° °[facility] °Phone:  342-8316 ° °[place] Child [facility] Hotline °(336) 342-1394 °(336) 342-3537 (After Hours) ° ° ° ° °

## 2014-08-07 ENCOUNTER — Encounter: Payer: Medicare Other | Admitting: Surgery

## 2014-08-07 NOTE — ED Provider Notes (Signed)
Medical screening examination/treatment/procedure(s) were performed by non-physician practitioner and as supervising physician I was immediately available for consultation/collaboration.   EKG Interpretation None        Trystian Crisanto, DO 08/07/14 1540 

## 2015-06-18 ENCOUNTER — Other Ambulatory Visit: Payer: Self-pay | Admitting: *Deleted

## 2015-06-18 DIAGNOSIS — I719 Aortic aneurysm of unspecified site, without rupture: Secondary | ICD-10-CM

## 2015-07-09 ENCOUNTER — Telehealth: Payer: Self-pay | Admitting: *Deleted

## 2015-07-09 NOTE — Telephone Encounter (Signed)
PATIENT CANCELLED APPT WITH DR [id number] AND CTA CHEST ABD & PELVIC SCHEDULED FOR 07/12/15,HE IS IN [place] AND MAY MOVE./WILL CALL US BACK TO R/S

## 2015-07-12 ENCOUNTER — Other Ambulatory Visit: Payer: Self-pay

## 2015-07-12 ENCOUNTER — Ambulatory Visit: Payer: Self-pay | Admitting: Cardiothoracic Surgery

## 2017-04-07 ENCOUNTER — Encounter: Payer: Self-pay | Admitting: Dietician

## 2017-04-07 ENCOUNTER — Encounter: Payer: Medicare Other | Attending: Internal Medicine | Admitting: Dietician

## 2017-04-07 DIAGNOSIS — Z713 Dietary counseling and surveillance: Secondary | ICD-10-CM | POA: Insufficient documentation

## 2017-04-07 DIAGNOSIS — E119 Type 2 diabetes mellitus without complications: Secondary | ICD-10-CM | POA: Insufficient documentation

## 2017-04-07 DIAGNOSIS — E118 Type 2 diabetes mellitus with unspecified complications: Secondary | ICD-10-CM

## 2017-04-07 NOTE — Progress Notes (Signed)

## 2017-04-14 ENCOUNTER — Ambulatory Visit: Payer: Medicare Other

## 2017-04-21 ENCOUNTER — Ambulatory Visit: Payer: Medicare Other

## 2017-05-05 ENCOUNTER — Encounter: Payer: Medicare Other | Attending: Internal Medicine | Admitting: Dietician

## 2017-05-05 DIAGNOSIS — E119 Type 2 diabetes mellitus without complications: Secondary | ICD-10-CM | POA: Diagnosis not present

## 2017-05-05 DIAGNOSIS — Z713 Dietary counseling and surveillance: Secondary | ICD-10-CM | POA: Insufficient documentation

## 2017-05-05 NOTE — Progress Notes (Signed)

## 2017-05-12 ENCOUNTER — Ambulatory Visit: Payer: Medicare Other

## 2017-05-22 DIAGNOSIS — G510 Bell's palsy: Secondary | ICD-10-CM

## 2017-05-22 HISTORY — DX: Bell's palsy: G51.0

## 2017-06-10 ENCOUNTER — Encounter (HOSPITAL_COMMUNITY): Payer: Self-pay | Admitting: Emergency Medicine

## 2017-06-10 ENCOUNTER — Emergency Department (HOSPITAL_COMMUNITY)
Admission: EM | Admit: 2017-06-10 | Discharge: 2017-06-10 | Disposition: A | Discharge status: Home / Self Care | Payer: Medicare Other | Attending: Emergency Medicine | Admitting: Emergency Medicine

## 2017-06-10 ENCOUNTER — Emergency Department (HOSPITAL_COMMUNITY): Payer: Medicare Other

## 2017-06-10 DIAGNOSIS — E119 Type 2 diabetes mellitus without complications: Secondary | ICD-10-CM | POA: Insufficient documentation

## 2017-06-10 DIAGNOSIS — Z7984 Long term (current) use of oral hypoglycemic drugs: Secondary | ICD-10-CM | POA: Diagnosis not present

## 2017-06-10 DIAGNOSIS — Z7982 Long term (current) use of aspirin: Secondary | ICD-10-CM | POA: Diagnosis not present

## 2017-06-10 DIAGNOSIS — G510 Bell's palsy: Secondary | ICD-10-CM | POA: Insufficient documentation

## 2017-06-10 DIAGNOSIS — Z79899 Other long term (current) drug therapy: Secondary | ICD-10-CM | POA: Insufficient documentation

## 2017-06-10 DIAGNOSIS — I10 Essential (primary) hypertension: Secondary | ICD-10-CM | POA: Insufficient documentation

## 2017-06-10 DIAGNOSIS — R200 Anesthesia of skin: Secondary | ICD-10-CM | POA: Diagnosis present

## 2017-06-10 LAB — BASIC METABOLIC PANEL
Anion gap: 10 (ref 5–15)
BUN: 10 mg/dL (ref 6–20)
CO2: 23 mmol/L (ref 22–32)
CREATININE: 1.02 mg/dL (ref 0.61–1.24)
Calcium: 9.2 mg/dL (ref 8.9–10.3)
Chloride: 109 mmol/L (ref 101–111)
GFR calc Af Amer: >60 mL/min (ref >60)
GLUCOSE: 131 mg/dL — AB (ref 65–99)
Potassium: 3.9 mmol/L (ref 3.5–5.1)
Sodium: 142 mmol/L (ref 135–145)

## 2017-06-10 LAB — CBC
HCT: 36.9 % — ABNORMAL LOW (ref 39.0–52.0)
Hemoglobin: 12.3 g/dL — ABNORMAL LOW (ref 13.0–17.0)
MCH: 29.7 pg (ref 26.0–34.0)
MCHC: 33.3 g/dL (ref 30.0–36.0)
MCV: 89.1 fL (ref 78.0–100.0)
PLATELETS: 251 10*3/uL (ref 150–400)
RBC: 4.14 MIL/uL — ABNORMAL LOW (ref 4.22–5.81)
RDW: 14.1 % (ref 11.5–15.5)
WBC: 9.2 10*3/uL (ref 4.0–10.5)

## 2017-06-10 LAB — CBG MONITORING, ED: Glucose-Capillary: 116 mg/dL — ABNORMAL HIGH (ref 65–99)

## 2017-06-10 MED ORDER — NAPROXEN 500 MG PO TABS: 500.0000 mg | ORAL_TABLET | Freq: Two times a day (BID) | ORAL | 0 refills | Status: DC

## 2017-06-10 MED ORDER — VALACYCLOVIR HCL 1 G PO TABS: 1000.0000 mg | ORAL_TABLET | Freq: Three times a day (TID) | ORAL | 0 refills | Status: AC

## 2017-06-10 MED ORDER — PREDNISONE 20 MG PO TABS: ORAL_TABLET | ORAL | 0 refills | Status: DC

## 2017-06-10 NOTE — ED Triage Notes (Signed)
Pt states today after he took a nap the left side of his face felt numb and drooping   Pt states his joints are painful  Pt states he has had left arm numbness off and on for about a week  Pt states he has neck stiffness and headache

## 2017-06-10 NOTE — ED Notes (Signed)
Left facial droup and difficulty controlling tongue pt reports has been occurring x2 weeks and slightly worse today

## 2017-06-10 NOTE — Discharge Instructions (Signed)
It was my pleasure taking care of you today!   Take all medications as directed. You will also likely need to apply artifical tear eye drops to keep the left eye moist. You will need to call your primary care physician first thing in the morning. Please let them know you were seen in the ER tonight and diagnosed with Bell's Palsy, however we recommend you be scheduled for an outpatient open MRI as soon as possible.   Return to ER for new or worsening symptoms, any additonal concerns.

## 2017-06-10 NOTE — ED Provider Notes (Signed)
[facility]-EMERGENCY DEPT Provider Note   CSN: [id number] Arrival date & time: 06/10/17  0018     History   Chief Complaint Chief Complaint  Patient presents with  . stroke like symptoms    HPI Bryan Wells is a 56 y.o. male.  The history is provided by the patient and medical records. No language interpreter was used.   Bryan Wells is a 56 y.o. male  with a PMH of AAA, DM, HTN, obesity who presents to the Emergency Department complaining of left-sided facial numbness after awakening from nap today at 7pm. Patient states that three days ago, his left eye began tearing and he would intermittently have blurred vision from the left eye. He thought this was 2/2 his allergies. Symptoms persisted, therefore he saw his PCP this morning at 8:00 am. He was given medication for allergies. He went home and took at nap at approx. 3 pm. When he awoke around 7 pm, his left face felt numb and stiff. He also endorses ear ringing upon awakening as well. His left arm feels stiff, but does not feel weak. No slurred speech or trouble ambulating. No meds taken prior to arrival for symptoms. No history of similar sxs.   Past Medical History:  Diagnosis Date  . AAA (abdominal aortic aneurysm) (HCC)   . Diabetes mellitus without complication (HCC)   . Hypertension   . Obesity     Patient Active Problem List   Diagnosis Date Noted  . SINUSITIS, CHRONIC 12/11/2009  . ERECTILE DYSFUNCTION, ORGANIC 12/11/2009  . MORBID OBESITY 12/10/2009  . DEPRESSION 12/10/2009  . ULNAR NERVE ENTRAPMENT 12/10/2009  . AORTIC ANEURYSM 12/10/2009  . HYPERTENSION NEC 12/10/2009    History reviewed. No pertinent surgical history.     Home Medications    Prior to Admission medications   Medication Sig Start Date End Date Taking? Authorizing Provider  aspirin EC 81 MG tablet Take 81 mg by mouth daily.   Yes [provider]  atorvastatin (LIPITOR) 10 MG tablet Take 10 mg by mouth daily.   Yes [provider]  diltiazem (CARDIZEM CD) 240 MG 24 hr capsule Take 240 mg by mouth 2 (two) times daily.    Yes [provider]  furosemide (LASIX) 40 MG tablet Take 40 mg by mouth daily.    Yes [provider]  labetalol (NORMODYNE) 200 MG tablet Take 300 mg by mouth 2 (two) times daily.    Yes [provider]  metFORMIN (GLUCOPHAGE) 500 MG tablet Take 500 mg by mouth 2 (two) times daily with a meal.    Yes [provider]  omeprazole (PRILOSEC) 20 MG capsule Take 20 mg by mouth daily.   Yes [provider]  clindamycin (CLEOCIN) 150 MG capsule Take 3 capsules (450 mg total) by mouth 3 (three) times daily. Patient not taking: Reported on 06/10/2017 08/05/14   Santiago Glad, PA-C  naproxen (NAPROSYN) 500 MG tablet Take 1 tablet (500 mg total) by mouth 2 (two) times daily. 06/10/17   Ward, Chase Picket, PA-C  predniSONE (DELTASONE) 20 MG tablet Take 3 tablets (60 mg) daily for 5 days. Then take 2 tablets (40 mg) x 2 days. Then take 1 tablet (20 mg) x 3 days. 06/10/17   Ward, Chase Picket, PA-C  traMADol (ULTRAM) 50 MG tablet Take 1 tablet (50 mg total) by mouth every 6 (six) hours as needed. Patient not taking: Reported on 06/10/2017 08/05/14   Santiago Glad, PA-C  valACYclovir (VALTREX) 1000 MG tablet Take  1 tablet (1,000 mg total) by mouth 3 (three) times daily. 06/10/17 06/24/17  Ward, Chase Picket, PA-C    Family History No family history on file.  Social History Social History  Substance Use Topics  . Smoking status: Never Smoker  . Smokeless tobacco: Never Used  . Alcohol use No     Allergies   Penicillins   Review of Systems Review of Systems  HENT: Positive for congestion.   Eyes: Positive for discharge, itching and visual disturbance.  Musculoskeletal: Positive for myalgias.  Neurological: Positive for numbness. Negative for headaches.  All other systems reviewed and are negative.    Physical Exam Updated Vital Signs BP (!) 148/72 (BP  Location: Left Arm)   Pulse 88   Temp 97.8 F (36.6 C)   Resp 19   Ht 6' (1.829 m)   Wt (!) 181.4 kg (400 lb)   SpO2 99%   BMI 54.25 kg/m   Physical Exam  Constitutional: He is oriented to person, place, and time. He appears well-developed and well-nourished. No distress.  HENT:  Head: Normocephalic and atraumatic.  Cardiovascular: Normal rate, regular rhythm and normal heart sounds.   No murmur heard. Pulmonary/Chest: Effort normal and breath sounds normal. No respiratory distress.  Abdominal: Soft. He exhibits no distension. There is no tenderness.  Musculoskeletal: He exhibits no edema.  Neurological: He is alert and oriented to person, place, and time.  Alert, oriented, thought content appropriate, able to give a coherent history. Speech is clear and goal oriented, able to follow commands.  Cranial Nerves:  II:  Peripheral visual fields grossly normal, pupils equal, round, reactive to light III, IV, VI: EOM intact bilaterally V,VII: + LEFT FACIAL DROOP, facial light touch sensation equal, UNABLE TO CLOSE LEFT EYE COMPLETELY. UNABLE TO RAISE LEFT EYEBROW VIII: hearing grossly normal IX, X: symmetric soft palate movement, uvula elevates symmetrically  XI: bilateral shoulder shrug symmetric and strong XII: midline tongue extension 5/5 muscle strength in upper and lower extremities bilaterally including strong and equal grip strength and dorsiflexion/plantar flexion Sensory to light touch normal in all four extremities.  Normal finger-to-nose and rapid alternating movements; normal gait and balance. No drift.  Skin: Skin is warm and dry.  Nursing note and vitals reviewed.    ED Treatments / Results  Labs (all labs ordered are listed, but only abnormal results are displayed) Labs Reviewed  BASIC METABOLIC PANEL - Abnormal; Notable for the following:       Result Value   Glucose, Bld 131 (*)    All other components within normal limits  CBC - Abnormal; Notable for the  following:    RBC 4.14 (*)    Hemoglobin 12.3 (*)    HCT 36.9 (*)    All other components within normal limits  CBG MONITORING, ED - Abnormal; Notable for the following:    Glucose-Capillary 116 (*)    All other components within normal limits  URINALYSIS, ROUTINE W REFLEX MICROSCOPIC    EKG ECG interpretation: Normal sinus rhythm at 90 bpm. Normal axis. Borderline prolonged QT interval. Nonspecific T-wave abnormality. Mild baseline artifact. No prior ECG from available for comparison.  ECG interpretation: Normal sinus rhythm at 90 bpm. Normal axis. Borderline prolonged QT interval. Nonspecific T-wave abnormality. Mild baseline artifact. No prior ECG from available for comparison. Radiology Ct Head Wo Contrast  Result Date: 06/10/2017 CLINICAL DATA:  Acute onset of intermittent left arm numbness. Left-sided facial numbness and facial droop. Neck stiffness and headache. Initial encounter. EXAM: CT  HEAD WITHOUT CONTRAST TECHNIQUE: Contiguous axial images were obtained from the base of the skull through the vertex without intravenous contrast. COMPARISON:  None. FINDINGS: Brain: No evidence of acute infarction, hemorrhage, hydrocephalus, extra-axial collection or mass lesion/mass effect. The posterior fossa, including the cerebellum, brainstem and fourth ventricle, is within normal limits. The third and lateral ventricles, and basal ganglia are unremarkable in appearance. The cerebral hemispheres are symmetric in appearance, with normal gray-white differentiation. No mass effect or midline shift is seen. Vascular: No hyperdense vessel or unexpected calcification. Skull: There is no evidence of fracture; visualized osseous structures are unremarkable in appearance. Sinuses/Orbits: The orbits are within normal limits. There is mild partial opacification of the left maxillary sinus and left side of the sphenoid sinus. The remaining paranasal sinuses and mastoid air cells are well-aerated. Other: No  significant soft tissue abnormalities are seen. IMPRESSION: 1. No acute intracranial pathology seen on CT. 2. Mild partial opacification of the left maxillary sinus and left side of the sphenoid sinus. Electronically Signed   By: Roanna RaiderJeffery  Chang M.D.   On: 06/10/2017 01:30    Procedures Procedures (including critical care time)  Medications Ordered in ED Medications - No data to display   Initial Impression / Assessment and Plan / ED Course  I have reviewed the triage vital signs and the nursing notes.  Pertinent labs & imaging results that were available during my care of the patient were reviewed by me and considered in my medical decision making (see chart for details).    Bryan Wells is a 56 y.o. male who presents to ED for three day history of left eye dryness, discharge with acute onset of left-sided facial numbness which began today after awakening from nap at 7 PM. On exam, patient has left peripheral 7th nerve palsy with forehead involvement. Appears c/w Bell's Palsy however CT head obtained to assess for stroke. CT head negative. Labs reassuring. Attending, Dr. Preston FleetingGlick, spoke with neurology who recommends treating for Bell's palsy with antivirals and steroids as well as close PCP follow up to obtain outpatient open MRI (pt. Weighs 181 kg). This information was relayed to patient who is in agreement with plan. Understands home care, follow up care and return precautions.   Patient seen by and discussed with Dr. Preston FleetingGlick who agrees with treatment plan.    Final Clinical Impressions(s) / ED Diagnoses   Final diagnoses:  Bell's palsy    New Prescriptions Discharge Medication List as of 06/10/2017  2:51 AM    START taking these medications   Details  naproxen (NAPROSYN) 500 MG tablet Take 1 tablet (500 mg total) by mouth 2 (two) times daily., Starting Wed 06/10/2017, Print    predniSONE (DELTASONE) 20 MG tablet Take 3 tablets (60 mg) daily for 5 days. Then take 2 tablets (40 mg) x 2  days. Then take 1 tablet (20 mg) x 3 days., Print    valACYclovir (VALTREX) 1000 MG tablet Take 1 tablet (1,000 mg total) by mouth 3 (three) times daily., Starting Wed 06/10/2017, Until Wed 06/24/2017, Print         Ward, Chase PicketJaime Pilcher, PA-C 06/10/17 0505    Dione BoozeGlick, David, MD 06/10/17 [phone]

## 2017-08-21 DIAGNOSIS — Z6841 Body Mass Index (BMI) 40.0 and over, adult: Secondary | ICD-10-CM

## 2017-08-21 DIAGNOSIS — E1165 Type 2 diabetes mellitus with hyperglycemia: Secondary | ICD-10-CM | POA: Insufficient documentation

## 2017-08-21 DIAGNOSIS — G4730 Sleep apnea, unspecified: Secondary | ICD-10-CM | POA: Insufficient documentation

## 2017-08-21 DIAGNOSIS — G510 Bell's palsy: Secondary | ICD-10-CM | POA: Insufficient documentation

## 2017-08-21 DIAGNOSIS — E785 Hyperlipidemia, unspecified: Secondary | ICD-10-CM | POA: Insufficient documentation

## 2017-09-13 ENCOUNTER — Emergency Department (HOSPITAL_COMMUNITY): Payer: Medicare Other

## 2017-09-13 ENCOUNTER — Emergency Department (HOSPITAL_COMMUNITY)
Admission: EM | Admit: 2017-09-13 | Discharge: 2017-09-13 | Disposition: A | Discharge status: Home / Self Care | Payer: Medicare Other | Attending: Emergency Medicine | Admitting: Emergency Medicine

## 2017-09-13 ENCOUNTER — Encounter (HOSPITAL_COMMUNITY): Payer: Self-pay | Admitting: Emergency Medicine

## 2017-09-13 DIAGNOSIS — E1165 Type 2 diabetes mellitus with hyperglycemia: Secondary | ICD-10-CM | POA: Diagnosis not present

## 2017-09-13 DIAGNOSIS — I10 Essential (primary) hypertension: Secondary | ICD-10-CM | POA: Diagnosis not present

## 2017-09-13 DIAGNOSIS — Z79899 Other long term (current) drug therapy: Secondary | ICD-10-CM | POA: Diagnosis not present

## 2017-09-13 DIAGNOSIS — Z7984 Long term (current) use of oral hypoglycemic drugs: Secondary | ICD-10-CM | POA: Diagnosis not present

## 2017-09-13 DIAGNOSIS — Z7982 Long term (current) use of aspirin: Secondary | ICD-10-CM | POA: Insufficient documentation

## 2017-09-13 DIAGNOSIS — R0602 Shortness of breath: Secondary | ICD-10-CM | POA: Diagnosis present

## 2017-09-13 DIAGNOSIS — R350 Frequency of micturition: Secondary | ICD-10-CM | POA: Diagnosis not present

## 2017-09-13 DIAGNOSIS — R739 Hyperglycemia, unspecified: Secondary | ICD-10-CM

## 2017-09-13 HISTORY — DX: Bell's palsy: G51.0

## 2017-09-13 LAB — I-STAT CHEM 8, ED
BUN: 20 mg/dL (ref 6–20)
CHLORIDE: 101 mmol/L (ref 101–111)
Calcium, Ion: 1.22 mmol/L (ref 1.15–1.40)
Creatinine, Ser: 0.9 mg/dL (ref 0.61–1.24)
Glucose, Bld: 484 mg/dL — ABNORMAL HIGH (ref 65–99)
HEMATOCRIT: 42 % (ref 39.0–52.0)
Hemoglobin: 14.3 g/dL (ref 13.0–17.0)
POTASSIUM: 4.9 mmol/L (ref 3.5–5.1)
SODIUM: 134 mmol/L — AB (ref 135–145)
TCO2: 22 mmol/L (ref 22–32)

## 2017-09-13 LAB — CBC
HCT: 40 % (ref 39.0–52.0)
Hemoglobin: 13.8 g/dL (ref 13.0–17.0)
MCH: 30.1 pg (ref 26.0–34.0)
MCHC: 34.5 g/dL (ref 30.0–36.0)
MCV: 87.1 fL (ref 78.0–100.0)
Platelets: 224 10*3/uL (ref 150–400)
RBC: 4.59 MIL/uL (ref 4.22–5.81)
RDW: 13.1 % (ref 11.5–15.5)
WBC: 6.7 10*3/uL (ref 4.0–10.5)

## 2017-09-13 LAB — BLOOD GAS, VENOUS
Acid-base deficit: 3.3 mmol/L — ABNORMAL HIGH (ref 0.0–2.0)
BICARBONATE: 19.6 mmol/L — AB (ref 20.0–28.0)
Drawn by: 295031
FIO2: 21
O2 Saturation: 88.4 %
PATIENT TEMPERATURE: 98.6
PH VEN: 7.42 (ref 7.250–7.430)
PO2 VEN: 57.9 mmHg — AB (ref 32.0–45.0)
pCO2, Ven: 30.8 mmHg — ABNORMAL LOW (ref 44.0–60.0)

## 2017-09-13 LAB — CBG MONITORING, ED
Glucose-Capillary: >600 mg/dL (ref 65–99)
Glucose-Capillary: >600 mg/dL (ref 65–99)
Glucose-Capillary: 594 mg/dL (ref 65–99)
Glucose-Capillary: 492 mg/dL — ABNORMAL HIGH (ref 65–99)
Glucose-Capillary: 408 mg/dL — ABNORMAL HIGH (ref 65–99)
Glucose-Capillary: 404 mg/dL — ABNORMAL HIGH (ref 65–99)

## 2017-09-13 LAB — BASIC METABOLIC PANEL
Anion gap: 13 (ref 5–15)
BUN: 18 mg/dL (ref 6–20)
CO2: 20 mmol/L — ABNORMAL LOW (ref 22–32)
CREATININE: 1.36 mg/dL — AB (ref 0.61–1.24)
Calcium: 9.4 mg/dL (ref 8.9–10.3)
Chloride: 91 mmol/L — ABNORMAL LOW (ref 101–111)
GFR calc Af Amer: >60 mL/min (ref >60)
GFR, EST NON AFRICAN AMERICAN: 57 mL/min — AB (ref >60)
Glucose, Bld: 855 mg/dL (ref 65–99)
Potassium: 5.3 mmol/L — ABNORMAL HIGH (ref 3.5–5.1)
SODIUM: 124 mmol/L — AB (ref 135–145)

## 2017-09-13 LAB — URINALYSIS, ROUTINE W REFLEX MICROSCOPIC
BILIRUBIN URINE: NEGATIVE
Glucose, UA: >=500 mg/dL — AB
HGB URINE DIPSTICK: NEGATIVE
KETONES UR: NEGATIVE mg/dL
Leukocytes, UA: NEGATIVE
NITRITE: NEGATIVE
PROTEIN: NEGATIVE mg/dL
Specific Gravity, Urine: <1.005 — ABNORMAL LOW (ref 1.005–1.030)
pH: 6 (ref 5.0–8.0)

## 2017-09-13 LAB — URINALYSIS, MICROSCOPIC (REFLEX)
Bacteria, UA: NONE SEEN
Squamous Epithelial / HPF: NONE SEEN

## 2017-09-13 MED ORDER — SODIUM CHLORIDE 0.9 % IV SOLN: INTRAVENOUS | Status: DC

## 2017-09-13 MED ORDER — DEXTROSE-NACL 5-0.45 % IV SOLN: INTRAVENOUS | Status: DC

## 2017-09-13 MED ORDER — SODIUM CHLORIDE 0.9 % IV SOLN
INTRAVENOUS | Status: DC
  Administered 2017-09-13: 5.4 [IU]/h via INTRAVENOUS
  Filled 2017-09-13: qty 1

## 2017-09-13 MED ORDER — ACETAMINOPHEN 500 MG PO TABS
1000.0000 mg | ORAL_TABLET | Freq: Once | ORAL | Status: AC
  Administered 2017-09-13: 1000 mg via ORAL
  Filled 2017-09-13: qty 2

## 2017-09-13 MED ORDER — LORAZEPAM 2 MG/ML IJ SOLN
1.0000 mg | Freq: Once | INTRAMUSCULAR | Status: AC
  Administered 2017-09-13: 1 mg via INTRAVENOUS
  Filled 2017-09-13: qty 1

## 2017-09-13 MED ORDER — SODIUM CHLORIDE 0.9 % IV BOLUS (SEPSIS)
2000.0000 mL | Freq: Once | INTRAVENOUS | Status: AC
  Administered 2017-09-13: 2000 mL via INTRAVENOUS

## 2017-09-13 NOTE — ED Notes (Signed)
Bed: WTR5 Expected date:  Expected time:  Means of arrival:  Comments: 

## 2017-09-13 NOTE — ED Notes (Signed)
Bed: WU98 Expected date:  Expected time:  Means of arrival:  Comments: Triage pt

## 2017-09-13 NOTE — ED Provider Notes (Signed)
[facility]-EMERGENCY DEPT Provider Note   CSN: [id number] Arrival date & time: 09/13/17  0534     History   Chief Complaint Chief Complaint  Patient presents with  . Hyperglycemia  . Polyuria    HPI Bryan Wells is a 56 y.o. male.complains of cough for the past 4 days productive of white sputum accompanied by shortness of breath. Denies any feverdenies chest pain nothing makes symptoms better or worse. Other associated symptoms include lightheadedness for approximately the past one week, polyuria and polydipsia. He admits to occasional noncompliance with his medications and diet he's been drinking ginger aledue to increased thirst.. No treatment prior to coming here lightheadedness is worse with standing improve with lying down. He denies orthopnea. HPI  Past Medical History:  Diagnosis Date  . AAA (abdominal aortic aneurysm) (HCC)   . Bell's palsy   . Diabetes mellitus without complication (HCC)   . Hypertension   . Obesity     Patient Active Problem List   Diagnosis Date Noted  . SINUSITIS, CHRONIC 12/11/2009  . ERECTILE DYSFUNCTION, ORGANIC 12/11/2009  . MORBID OBESITY 12/10/2009  . DEPRESSION 12/10/2009  . ULNAR NERVE ENTRAPMENT 12/10/2009  . AORTIC ANEURYSM 12/10/2009  . HYPERTENSION NEC 12/10/2009   Sleep apnea History reviewed. No pertinent surgical history.     Home Medications    Prior to Admission medications   Medication Sig Start Date End Date Taking? Authorizing Provider  aspirin EC 81 MG tablet Take 81 mg by mouth daily.    [provider]  atorvastatin (LIPITOR) 10 MG tablet Take 10 mg by mouth daily.    [provider]  clindamycin (CLEOCIN) 150 MG capsule Take 3 capsules (450 mg total) by mouth 3 (three) times daily. Patient not taking: Reported on 06/10/2017 08/05/14   Santiago Glad, PA-C  diltiazem (CARDIZEM CD) 240 MG 24 hr capsule Take 240 mg by mouth 2 (two) times daily.     [provider]  furosemide (LASIX) 40 MG  tablet Take 40 mg by mouth daily.     [provider]  labetalol (NORMODYNE) 200 MG tablet Take 300 mg by mouth 2 (two) times daily.     [provider]  metFORMIN (GLUCOPHAGE) 500 MG tablet Take 500 mg by mouth 2 (two) times daily with a meal.     [provider]  naproxen (NAPROSYN) 500 MG tablet Take 1 tablet (500 mg total) by mouth 2 (two) times daily. 06/10/17   Ward, Chase Picket, PA-C  omeprazole (PRILOSEC) 20 MG capsule Take 20 mg by mouth daily.    [provider]  predniSONE (DELTASONE) 20 MG tablet Take 3 tablets (60 mg) daily for 5 days. Then take 2 tablets (40 mg) x 2 days. Then take 1 tablet (20 mg) x 3 days. 06/10/17   Ward, Chase Picket, PA-C  traMADol (ULTRAM) 50 MG tablet Take 1 tablet (50 mg total) by mouth every 6 (six) hours as needed. Patient not taking: Reported on 06/10/2017 08/05/14   Santiago Glad, PA-C    Family History History reviewed. No pertinent family history.  Social History Social History  Substance Use Topics  . Smoking status: Never Smoker  . Smokeless tobacco: Never Used  . Alcohol use No  no illicit drug   Allergies   Penicillins   Review of Systems Review of Systems  Constitutional: Negative.   HENT: Negative.   Respiratory: Positive for cough and shortness of breath.   Cardiovascular: Positive for chest pain.  Syncope  Gastrointestinal: Negative.   Endocrine: Positive for polydipsia and polyuria.  Musculoskeletal: Negative.   Skin: Negative.   Allergic/Immunologic: Positive for immunocompromised state.       Diabetic  Neurological: Positive for light-headedness.  Psychiatric/Behavioral: Negative.   All other systems reviewed and are negative.    Physical Exam Updated Vital Signs BP (!) 142/76   Pulse 85   Temp 97.7 F (36.5 C)   Resp (!) 22   SpO2 95%   Physical Exam  Constitutional: He appears well-developed and well-nourished. No distress.  HENT:  Head: Normocephalic and  atraumatic.  Eyes: Pupils are equal, round, and reactive to light. Conjunctivae are normal.  Neck: Neck supple. No JVD present. No tracheal deviation present. No thyromegaly present.  Cardiovascular: Normal rate and regular rhythm.   No murmur heard. Pulmonary/Chest: Effort normal and breath sounds normal.  Coughing occasionally  Abdominal: Soft. Bowel sounds are normal. He exhibits no distension. There is no tenderness.  Morbidly obese  Musculoskeletal: Normal range of motion. He exhibits no edema or tenderness.  Neurological: He is alert. Coordination normal.  Skin: Skin is warm and dry. No rash noted.  Psychiatric: He has a normal mood and affect.  Nursing note and vitals reviewed.    ED Treatments / Results  Labs (all labs ordered are listed, but only abnormal results are displayed) Labs Reviewed  CBG MONITORING, ED - Abnormal; Notable for the following:       Result Value   Glucose-Capillary >600 (*)    All other components within normal limits  URINALYSIS, ROUTINE W REFLEX MICROSCOPIC  BASIC METABOLIC PANEL  CBC  CBG MONITORING, ED  1 PM patient complained of muscle cramps in left flank. Worse with changing positions improved with remaining still Tylenol ordered. At 310 p.m. muscle cramps of left flank were not improved. Intravenous Ativan ordered. 3:30 PM muscle cramps much improved. Patient was started on glucose stabilizer insulin intravenous drip to treat hyperglycemia. EKG  EKG Interpretation None      At 5:05 PM patient is alert and relates without difficulty feels much improved after treatment. He feels ready to go home Radiology No results found. Results for orders placed or performed during the hospital encounter of 09/13/17  Urinalysis, Routine w reflex microscopic- may I&O cath if menses  Result Value Ref Range   Color, Urine YELLOW YELLOW   APPearance CLEAR CLEAR   Specific Gravity, Urine <1.005 (L) 1.005 - 1.030   pH 6.0 5.0 - 8.0   Glucose, UA >=500  (A) NEGATIVE mg/dL   Hgb urine dipstick NEGATIVE NEGATIVE   Bilirubin Urine NEGATIVE NEGATIVE   Ketones, ur NEGATIVE NEGATIVE mg/dL   Protein, ur NEGATIVE NEGATIVE mg/dL   Nitrite NEGATIVE NEGATIVE   Leukocytes, UA NEGATIVE NEGATIVE  Basic metabolic panel  Result Value Ref Range   Sodium 124 (L) 135 - 145 mmol/L   Potassium 5.3 (H) 3.5 - 5.1 mmol/L   Chloride 91 (L) 101 - 111 mmol/L   CO2 20 (L) 22 - 32 mmol/L   Glucose, Bld 855 (HH) 65 - 99 mg/dL   BUN 18 6 - 20 mg/dL   Creatinine, Ser 9.60 (H) 0.61 - 1.24 mg/dL   Calcium 9.4 8.9 - 45.4 mg/dL   GFR calc non Af Amer 57 (L) >60 mL/min   GFR calc Af Amer >60 >60 mL/min   Anion gap 13 5 - 15  CBC  Result Value Ref Range   WBC 6.7 4.0 - 10.5 K/uL  RBC 4.59 4.22 - 5.81 MIL/uL   Hemoglobin 13.8 13.0 - 17.0 g/dL   HCT 40.9 81.1 - 91.4 %   MCV 87.1 78.0 - 100.0 fL   MCH 30.1 26.0 - 34.0 pg   MCHC 34.5 30.0 - 36.0 g/dL   RDW 78.2 95.6 - 21.3 %   Platelets 224 150 - 400 K/uL  Blood gas, venous  Result Value Ref Range   FIO2 21.00    Delivery systems ROOM AIR    pH, Ven 7.420 7.250 - 7.430   pCO2, Ven 30.8 (L) 44.0 - 60.0 mmHg   pO2, Ven 57.9 (H) 32.0 - 45.0 mmHg   Bicarbonate 19.6 (L) 20.0 - 28.0 mmol/L   Acid-base deficit 3.3 (H) 0.0 - 2.0 mmol/L   O2 Saturation 88.4 %   Patient temperature 98.6    Collection site VEIN    Drawn by [phone]    Sample type VEIN   Urinalysis, Microscopic (reflex)  Result Value Ref Range   RBC / HPF 0-5 0 - 5 RBC/hpf   WBC, UA 0-5 0 - 5 WBC/hpf   Bacteria, UA NONE SEEN NONE SEEN   Squamous Epithelial / LPF NONE SEEN NONE SEEN  CBG monitoring, ED  Result Value Ref Range   Glucose-Capillary >600 (HH) 65 - 99 mg/dL  CBG monitoring, ED  Result Value Ref Range   Glucose-Capillary >600 (HH) 65 - 99 mg/dL  CBG monitoring, ED  Result Value Ref Range   Glucose-Capillary 594 (HH) 65 - 99 mg/dL  CBG monitoring, ED  Result Value Ref Range   Glucose-Capillary 492 (H) 65 - 99 mg/dL  I-Stat Chem 8,  ED  Result Value Ref Range   Sodium 134 (L) 135 - 145 mmol/L   Potassium 4.9 3.5 - 5.1 mmol/L   Chloride 101 101 - 111 mmol/L   BUN 20 6 - 20 mg/dL   Creatinine, Ser 4.69 0.61 - 1.24 mg/dL   Glucose, Bld 629 (H) 65 - 99 mg/dL   Calcium, Ion 5.28 4.13 - 1.40 mmol/L   TCO2 22 22 - 32 mmol/L   Hemoglobin 14.3 13.0 - 17.0 g/dL   HCT 24.4 01.0 - 27.2 %  CBG monitoring, ED  Result Value Ref Range   Glucose-Capillary 408 (H) 65 - 99 mg/dL  CBG monitoring, ED  Result Value Ref Range   Glucose-Capillary 404 (H) 65 - 99 mg/dL  Chest x-ray viewed by me Dg Chest 2 View  Result Date: 09/13/2017 CLINICAL DATA:  56 year old male with cough. Chronic Stanford Type B aortic dissection. EXAM: CHEST  2 VIEW COMPARISON:  Chest CTA 06/28/2014 and earlier. FINDINGS: The patient arms are not raised on the lateral view. Tortuous thoracic aorta contour appears stable since the CT scout view in 2015. Other mediastinal contours are stable. No pneumothorax, pulmonary edema, pleural effusion or confluent pulmonary opacity. No acute osseous abnormality identified. Negative visible bowel gas pattern. IMPRESSION: 1.  No acute cardiopulmonary abnormality. 2. Chronic thoracic aortic tortuosity, corresponding to a history of chronic type B aortic dissection. Electronically Signed   By: Odessa Fleming M.D.   On: 09/13/2017 10:22   Procedures Procedures (including critical care time)  Medications Ordered in ED Medications  sodium chloride 0.9 % bolus 2,000 mL (not administered)   5 05PM Patient has not coughed in several hours and feels that his breathing is normal  Initial Impression / Assessment and Plan / ED Course  I have reviewed the triage vital signs and the nursing notes.  Pertinent  labs & imaging results that were available during my care of the patient were reviewed by me and considered in my medical decision making (see chart for details).     Plan patient has appointment to be evaluated tomorrow for CPAP. He  is encouraged to drink water or diet beverages.  Final Clinical Impressions(s) / ED Diagnoses  Diagnosis #1 cough #2 hyperglycemia #3 dehydration #4 noncompliance with medication and diet Final diagnoses:  None    New Prescriptions New Prescriptions   No medications on file     Doug Sou, MD 09/13/17 1725

## 2017-09-13 NOTE — Discharge Instructions (Signed)
Take all of your medications as prescribed. Keep your scheduled appointment to be evaluated for sleep apnea mask tomorrow at 10:30 AM. Avoid sugary drinks. Drink sugar free drinks or water. Return if concern for any reason or see your primary care physician

## 2017-09-13 NOTE — ED Triage Notes (Signed)
Pt reports he has been having a cough. Had increase in lasix 2 weeks ago. Pt reports this helped his cough and body swelling, but is bothered by frequent urination. Pt reports he has been having frequent urination since stopping his lasix. Has also been having polydipsia as well. Hx of DM.

## 2018-01-11 ENCOUNTER — Ambulatory Visit: Payer: Medicare Other | Admitting: Dietician

## 2018-06-08 DIAGNOSIS — E1165 Type 2 diabetes mellitus with hyperglycemia: Secondary | ICD-10-CM | POA: Diagnosis not present

## 2018-06-08 DIAGNOSIS — I10 Essential (primary) hypertension: Secondary | ICD-10-CM | POA: Diagnosis not present

## 2018-06-08 DIAGNOSIS — Z794 Long term (current) use of insulin: Secondary | ICD-10-CM | POA: Diagnosis not present

## 2018-06-08 DIAGNOSIS — E1169 Type 2 diabetes mellitus with other specified complication: Secondary | ICD-10-CM | POA: Diagnosis not present

## 2018-07-14 DIAGNOSIS — S90416A Abrasion, unspecified lesser toe(s), initial encounter: Secondary | ICD-10-CM | POA: Diagnosis not present

## 2018-08-30 DIAGNOSIS — Z23 Encounter for immunization: Secondary | ICD-10-CM | POA: Diagnosis not present

## 2018-08-30 DIAGNOSIS — L609 Nail disorder, unspecified: Secondary | ICD-10-CM | POA: Diagnosis not present

## 2018-08-30 DIAGNOSIS — E1151 Type 2 diabetes mellitus with diabetic peripheral angiopathy without gangrene: Secondary | ICD-10-CM | POA: Diagnosis not present

## 2018-08-30 DIAGNOSIS — Z713 Dietary counseling and surveillance: Secondary | ICD-10-CM | POA: Diagnosis not present

## 2018-08-30 DIAGNOSIS — Z6841 Body Mass Index (BMI) 40.0 and over, adult: Secondary | ICD-10-CM | POA: Diagnosis not present

## 2018-09-17 DIAGNOSIS — I10 Essential (primary) hypertension: Secondary | ICD-10-CM | POA: Diagnosis not present

## 2018-09-17 DIAGNOSIS — E1169 Type 2 diabetes mellitus with other specified complication: Secondary | ICD-10-CM | POA: Diagnosis not present

## 2018-09-17 DIAGNOSIS — E1159 Type 2 diabetes mellitus with other circulatory complications: Secondary | ICD-10-CM | POA: Diagnosis not present

## 2018-09-17 DIAGNOSIS — K219 Gastro-esophageal reflux disease without esophagitis: Secondary | ICD-10-CM | POA: Diagnosis not present

## 2018-09-17 DIAGNOSIS — G4730 Sleep apnea, unspecified: Secondary | ICD-10-CM | POA: Diagnosis not present

## 2018-09-17 DIAGNOSIS — E669 Obesity, unspecified: Secondary | ICD-10-CM | POA: Diagnosis not present

## 2018-09-21 ENCOUNTER — Other Ambulatory Visit: Payer: Self-pay | Admitting: *Deleted

## 2018-09-21 DIAGNOSIS — I714 Abdominal aortic aneurysm, without rupture, unspecified: Secondary | ICD-10-CM

## 2018-09-24 ENCOUNTER — Other Ambulatory Visit (HOSPITAL_COMMUNITY): Payer: Self-pay | Admitting: Surgery

## 2018-09-29 ENCOUNTER — Ambulatory Visit
Admission: RE | Admit: 2018-09-29 | Discharge: 2018-09-29 | Disposition: A | Discharge status: Home / Self Care | Payer: Medicare HMO | Source: Ambulatory Visit | Attending: Cardiothoracic Surgery | Admitting: Cardiothoracic Surgery

## 2018-09-29 DIAGNOSIS — I714 Abdominal aortic aneurysm, without rupture, unspecified: Secondary | ICD-10-CM

## 2018-09-29 DIAGNOSIS — I712 Thoracic aortic aneurysm, without rupture: Secondary | ICD-10-CM | POA: Diagnosis not present

## 2018-09-29 MED ORDER — IOPAMIDOL (ISOVUE-370) INJECTION 76%
75.0000 mL | Freq: Once | INTRAVENOUS | Status: AC | PRN
  Administered 2018-09-29: 75 mL via INTRAVENOUS

## 2018-10-02 DIAGNOSIS — Z91199 Patient's noncompliance with other medical treatment and regimen due to unspecified reason: Secondary | ICD-10-CM | POA: Insufficient documentation

## 2018-10-02 DIAGNOSIS — Z9119 Patient's noncompliance with other medical treatment and regimen: Secondary | ICD-10-CM | POA: Insufficient documentation

## 2018-10-13 ENCOUNTER — Ambulatory Visit: Payer: Medicare HMO | Admitting: Podiatry

## 2018-10-13 ENCOUNTER — Encounter: Payer: Self-pay | Admitting: Podiatry

## 2018-10-13 ENCOUNTER — Other Ambulatory Visit: Payer: Self-pay

## 2018-10-13 VITALS — BP 126/59 | HR 91

## 2018-10-13 DIAGNOSIS — B351 Tinea unguium: Secondary | ICD-10-CM | POA: Diagnosis not present

## 2018-10-13 DIAGNOSIS — M79674 Pain in right toe(s): Secondary | ICD-10-CM | POA: Diagnosis not present

## 2018-10-13 DIAGNOSIS — E1142 Type 2 diabetes mellitus with diabetic polyneuropathy: Secondary | ICD-10-CM

## 2018-10-13 DIAGNOSIS — M79675 Pain in left toe(s): Secondary | ICD-10-CM

## 2018-10-13 DIAGNOSIS — B353 Tinea pedis: Secondary | ICD-10-CM

## 2018-10-13 MED ORDER — CICLOPIROX OLAMINE 0.77 % EX CREA: TOPICAL_CREAM | CUTANEOUS | 1 refills | Status: DC

## 2018-10-13 NOTE — Patient Instructions (Signed)
Athlete's Foot Athlete's foot (tinea pedis) is a fungal infection of the skin on the feet. It often occurs on the skin that is between or underneath the toes. It can also occur on the soles of the feet. The infection can spread from person to person (is contagious). What are the causes? Athlete's foot is caused by a fungus. This fungus grows in warm, moist places. Most people get athlete's foot by sharing shower stalls, towels, and wet floors with someone who is infected. Not washing your feet or changing your socks often enough can contribute to athlete's foot. What increases the risk? This condition is more likely to develop in:  Men.  People who have a weak body defense system (immune system).  People who have diabetes.  People who use public showers, such as at a gym.  People who wear heavy-duty shoes, such as industrial or military shoes.  Seasons with warm, humid weather.  What are the signs or symptoms? Symptoms of this condition include:  Itchy areas between the toes or on the soles of the feet.  White, flaky, or scaly areas between the toes or on the soles of the feet.  Very itchy small blisters between the toes or on the soles of the feet.  Small cuts on the skin. These cuts can become infected.  Thick or discolored toenails.  How is this diagnosed? This condition is diagnosed with a medical history and physical exam. Your health care provider may also take a skin or toenail sample to be examined. How is this treated? Treatment for this condition includes antifungal medicines. These may be applied as powders, ointments, or creams. In severe cases, an oral antifungal medicine may be given. Follow these instructions at home:  Apply or take over-the-counter and prescription medicines only as told by your health care provider.  Keep all follow-up visits as told by your health care provider. This is important.  Do not scratch your feet.  Keep your feet dry: ? Wear  cotton or wool socks. Change your socks every day or if they become wet. ? Wear shoes that allow air to circulate, such as sandals or canvas tennis shoes.  Wash and dry your feet: ? Every day or as told by your health care provider. ? After exercising. ? Including the area between your toes.  Do not share towels, nail clippers, or other personal items that touch your feet with others.  If you have diabetes, keep your blood sugar under control. How is this prevented?  Do not share towels.  Wear sandals in wet areas, such as locker rooms and shared showers.  Keep your feet dry: ? Wear cotton or wool socks. Change your socks every day or if they become wet. ? Wear shoes that allow air to circulate, such as sandals or canvas tennis shoes.  Wash and dry your feet after exercising. Pay attention to the area between your toes. Contact a health care provider if:  You have a fever.  You have swelling, soreness, warmth, or redness in your foot.  You are not getting better with treatment.  Your symptoms get worse.  You have new symptoms. This information is not intended to replace advice given to you by your health care provider. Make sure you discuss any questions you have with your health care provider. Document Released: 12/05/2000 Document Revised: 05/15/2016 Document Reviewed: 06/11/2015 Elsevier Interactive Patient Education  2018 [employer] Diabetes and Foot Care Diabetes may cause you to have problems because of poor   blood supply (circulation) to your feet and legs. This may cause the skin on your feet to become thinner, break easier, and heal more slowly. Your skin may become dry, and the skin may peel and crack. You may also have nerve damage in your legs and feet causing decreased feeling in them. You may not notice minor injuries to your feet that could lead to infections or more serious problems. Taking care of your feet is one of the most important things you can do for  yourself. Follow these instructions at home:  Wear shoes at all times, even in the house. Do not go barefoot. Bare feet are easily injured.  Check your feet daily for blisters, cuts, and redness. If you cannot see the bottom of your feet, use a mirror or ask someone for help.  Wash your feet with warm water (do not use hot water) and mild soap. Then pat your feet and the areas between your toes until they are completely dry. Do not soak your feet as this can dry your skin.  Apply a moisturizing lotion or petroleum jelly (that does not contain alcohol and is unscented) to the skin on your feet and to dry, brittle toenails. Do not apply lotion between your toes.  Trim your toenails straight across. Do not dig under them or around the cuticle. File the edges of your nails with an emery board or nail file.  Do not cut corns or calluses or try to remove them with medicine.  Wear clean socks or stockings every day. Make sure they are not too tight. Do not wear knee-high stockings since they may decrease blood flow to your legs.  Wear shoes that fit properly and have enough cushioning. To break in new shoes, wear them for just a few hours a day. This prevents you from injuring your feet. Always look in your shoes before you put them on to be sure there are no objects inside.  Do not cross your legs. This may decrease the blood flow to your feet.  If you find a minor scrape, cut, or break in the skin on your feet, keep it and the skin around it clean and dry. These areas may be cleansed with mild soap and water. Do not cleanse the area with peroxide, alcohol, or iodine.  When you remove an adhesive bandage, be sure not to damage the skin around it.  If you have a wound, look at it several times a day to make sure it is healing.  Do not use heating pads or hot water bottles. They may burn your skin. If you have lost feeling in your feet or legs, you may not know it is happening until it is too  late.  Make sure your health care provider performs a complete foot exam at least annually or more often if you have foot problems. Report any cuts, sores, or bruises to your health care provider immediately. Contact a health care provider if:  You have an injury that is not healing.  You have cuts or breaks in the skin.  You have an ingrown nail.  You notice redness on your legs or feet.  You feel burning or tingling in your legs or feet.  You have pain or cramps in your legs and feet.  Your legs or feet are numb.  Your feet always feel cold. Get help right away if:  There is increasing redness, swelling, or pain in or around a wound.  There is a   red line that goes up your leg.  Pus is coming from a wound.  You develop a fever or as directed by your health care provider.  You notice a bad smell coming from an ulcer or wound. This information is not intended to replace advice given to you by your health care provider. Make sure you discuss any questions you have with your health care provider. Document Released: 12/05/2000 Document Revised: 05/15/2016 Document Reviewed: 05/17/2013 Elsevier Interactive Patient Education  2017 Elsevier Inc[employer]  

## 2018-10-13 NOTE — Progress Notes (Signed)
Subjective: Bryan Wells presents today with  cc of painful, discolored, thick toenails which interfere with daily activities and routine tasks. Duration greater than 2 months.  Pain is aggravated when wearing enclosed shoe gear. Pain is getting progressively worse and relieved with periodic professional debridement. He denies any attempt at treatment.  Medical History    Date Unknown AAA (abdominal aortic aneurysm) (HCC)  Date Unknown Bell's palsy  Date Unknown Diabetes mellitus without complication (HCC)  Date Unknown Hypertension  Date Unknown Obesity   Problem List    Cardiovascular and Mediastinum  AORTIC ANEURYSM   Hypertension   Respiratory  SINUSITIS, CHRONIC   Sleep apnea   Endocrine  Type 2 diabetes mellitus with hyperglycemia (HCC)   Nervous and Auditory  ULNAR NERVE ENTRAPMENT   Bell's palsy   Genitourinary  ERECTILE DYSFUNCTION, ORGANIC   Other  MORBID OBESITY   DEPRESSION   HYPERTENSION NEC   History of AAA (abdominal aortic aneurysm) repair   Hyperlipidemia   Morbid obesity with BMI of 50.0-59.9, adult (HCC)   Noncompliance with diabetes treatment    Problem List Collapse by Default  Collapse by Default     Cardiovascular and Mediastinum  AORTIC ANEURYSM   Hypertension   Respiratory  SINUSITIS, CHRONIC   Sleep apnea   Endocrine  Type 2 diabetes mellitus with hyperglycemia (HCC)   Nervous and Auditory  ULNAR NERVE ENTRAPMENT   Bell's palsy   Genitourinary  ERECTILE DYSFUNCTION, ORGANIC   Other  MORBID OBESITY   DEPRESSION   HYPERTENSION NEC   History of AAA (abdominal aortic aneurysm) repair   Hyperlipidemia   Morbid obesity with BMI of 50.0-59.9, adult (HCC)   Noncompliance with diabetes treatment    Surgical History   None   Medications    ACCU-CHEK AVIVA PLUS test strip    aspirin EC 81 MG tablet    atorvastatin (LIPITOR) 10 MG tablet    ciclopirox (LOPROX) 0.77 % cream    clindamycin (CLEOCIN) 150 MG capsule    clotrimazole-betamethasone (LOTRISONE) cream    diltiazem (CARDIZEM CD) 240 MG 24 hr capsule    doxycycline (VIBRA-TABS) 100 MG tablet    furosemide (LASIX) 40 MG tablet    glucose blood (ACCU-CHEK AVIVA PLUS) test strip    ibuprofen (ADVIL,MOTRIN) 200 MG tablet    Insulin Degludec 200 UNIT/ML SOPN    labetalol (NORMODYNE) 200 MG tablet    labetalol (NORMODYNE) 300 MG tablet    metFORMIN (GLUCOPHAGE) 1000 MG tablet    metFORMIN (GLUCOPHAGE) 500 MG tablet    naproxen (NAPROSYN) 500 MG tablet    omeprazole (PRILOSEC) 20 MG capsule    predniSONE (DELTASONE) 20 MG tablet    traMADol (ULTRAM) 50 MG tablet    TRESIBA FLEXTOUCH 200 UNIT/ML SOPN   Mark as Reviewed   Allergies     PenicillinsOther (See Comments)   Tobacco History   Smoking Status  Never Smoker  Smokeless Tobacco Status  Never Used   Family History   Father: diabetes with amputation    ROS: Per HPI unless specifically indicated in ROS section    Objective: Vitals:   10/13/18 0827  BP: (!) 126/59  Pulse: 91   Vascular Examination: Capillary refill time <3 seconds x 10 digits Dorsalis pedis and posterior tibial pulses present b/l No digital hair x 10 digits Skin temperature warm to warm b/l  Dermatological Examination: Skin with normal turgor, texture and tone b/l Toenails 1-5 b/l discolored, thick, dystrophic with subungual debris and pain with palpation  to nailbeds due to thickness of nails.  Diffuse scaling noted plantarly and peripherally b/l feet with mild foot odor. No blisters, no peeling.  Musculoskeletal: Muscle strength 5/5 to all LE muscle groups  Neurological: Sensation intact with 10 gram monofilament. Vibratory sensation intact.  Assessment: Painful onychomycosis toenails 1-5 b/l  Tinea pedis b/l  Plan: 1. Toenails 1-5 b/l were debrided in length and girth without iatrogenic bleeding. 2. Discussed diabetic foot care principles and tinea pedis. Handout dispensed to patient. 3. Order  written for Ciclopirox Cream 1% to be applied to both feet twice daily x 4 weeks 4. Patient to continue soft, supportive shoe gear 5. Patient to report any pedal injuries to medical professional immediately. 6. Follow up 3 months. Patient/POA to call should there be a concern in the interim.

## 2018-10-14 ENCOUNTER — Ambulatory Visit (HOSPITAL_COMMUNITY)
Admission: RE | Admit: 2018-10-14 | Discharge: 2018-10-14 | Disposition: A | Discharge status: Home / Self Care | Payer: Medicare HMO | Source: Ambulatory Visit | Attending: Surgery | Admitting: Surgery

## 2018-10-14 DIAGNOSIS — Z01818 Encounter for other preprocedural examination: Secondary | ICD-10-CM | POA: Diagnosis not present

## 2018-10-18 ENCOUNTER — Encounter: Payer: Self-pay | Admitting: Podiatry

## 2018-10-21 ENCOUNTER — Encounter: Payer: Medicare HMO | Attending: Surgery | Admitting: Dietician

## 2018-10-21 ENCOUNTER — Encounter: Payer: Self-pay | Admitting: Dietician

## 2018-10-21 DIAGNOSIS — Z7984 Long term (current) use of oral hypoglycemic drugs: Secondary | ICD-10-CM | POA: Diagnosis not present

## 2018-10-21 DIAGNOSIS — Z87891 Personal history of nicotine dependence: Secondary | ICD-10-CM | POA: Diagnosis not present

## 2018-10-21 DIAGNOSIS — Z8261 Family history of arthritis: Secondary | ICD-10-CM | POA: Insufficient documentation

## 2018-10-21 DIAGNOSIS — Z811 Family history of alcohol abuse and dependence: Secondary | ICD-10-CM | POA: Insufficient documentation

## 2018-10-21 DIAGNOSIS — E7800 Pure hypercholesterolemia, unspecified: Secondary | ICD-10-CM | POA: Insufficient documentation

## 2018-10-21 DIAGNOSIS — Z713 Dietary counseling and surveillance: Secondary | ICD-10-CM | POA: Insufficient documentation

## 2018-10-21 DIAGNOSIS — Z79899 Other long term (current) drug therapy: Secondary | ICD-10-CM | POA: Diagnosis not present

## 2018-10-21 DIAGNOSIS — I10 Essential (primary) hypertension: Secondary | ICD-10-CM | POA: Insufficient documentation

## 2018-10-21 DIAGNOSIS — Z880 Allergy status to penicillin: Secondary | ICD-10-CM | POA: Diagnosis not present

## 2018-10-21 DIAGNOSIS — Z833 Family history of diabetes mellitus: Secondary | ICD-10-CM | POA: Diagnosis not present

## 2018-10-21 DIAGNOSIS — Z6841 Body Mass Index (BMI) 40.0 and over, adult: Secondary | ICD-10-CM | POA: Insufficient documentation

## 2018-10-21 DIAGNOSIS — G4730 Sleep apnea, unspecified: Secondary | ICD-10-CM | POA: Diagnosis not present

## 2018-10-21 DIAGNOSIS — K219 Gastro-esophageal reflux disease without esophagitis: Secondary | ICD-10-CM | POA: Diagnosis not present

## 2018-10-21 DIAGNOSIS — E669 Obesity, unspecified: Secondary | ICD-10-CM

## 2018-10-21 DIAGNOSIS — E119 Type 2 diabetes mellitus without complications: Secondary | ICD-10-CM | POA: Insufficient documentation

## 2018-10-21 NOTE — Patient Instructions (Addendum)
Start with the following Pre-Op Goals:  . Practice not drinking 15 minutes before, during, and 30 minutes after each meal/snack . Aim for 64-100 ounces of fluid daily (with at least half of fluid intake being plain water)

## 2018-10-21 NOTE — Progress Notes (Signed)
Bariatric Pre-Op Nutrition Assessment for Planned Gastric Sleeve Surgery Medical Nutrition Therapy  Appt Start Time: 10:25pm  End time: 11:25am  Patient was seen on 10/21/2018 for Pre-Operative Nutrition Assessment. Assessment and letter of approval faxed to [facility] Surgery Bariatric Surgery Program coordinator on 10/21/2018.   Pt expectation of surgery: Pt is excited to have surgery and is very optimistic about the potential outcomes.  Pt expectation of dietitian: To help guide pt as he moves into the next stage of health and nutrition.   Anthropometrics  Start weight at [facility]: 420.2 lbs Height: 73 in BMI: 55.4 kg/m2    Pt states he would like to lose 30 lbs in 6 months (before surgery.)   Clinical  Medical Hx: obesity, HTN, sleep apnea, GERD, T2DM Surgeries: stint in leg Allergies: penicillin  Medications: Atorvastatin, Metformin, Tresiba, Furosemide, Labetalol, Diltiazem, Omeprazole  Psychosocial/Lifestyle Pt states he likes fishing. Pt states he likes clothing/dressing nicely and wants to fit into "nice clothes" such as a suit. Pt states he likes to cook.   24-Hr Dietary Recall First Meal: bagel + cream cheese + coffee + V8 (12oz) Snack: pretzels  Second Meal: beef + broccoli + rice + V8 (12oz) Snack: sugar-free blueberry muffin  Third Meal: chicken salad + whole wheat bread  + V8  Snack: pretzels Beverages: V8, water, coffee, diet soda   Food & Nutrition Related Hx Dietary Hx: Pt states he avoids fried foods. Pt states he is aware of what healthy foods are in general and is willing to eat them. Pt states he has already made dietary changes in efforts to lose weight before surgery. Pt states he does not taste foods the same (perhaps dysgeusia) for example where very sweet foods sometimes taste salty.   Estimated Daily Fluid Intake: 64 oz GI / Other Notable Symptoms: none   Physical Activity  Current average weekly physical activity: walking 3 days/week  Encouraged  to engage in 150 minutes of moderate physical activity including cardiovascular and weight baring weekly.   Estimated Energy Needs Calories: 2100 Carbohydrate: 235g Protein: 131g Fat: 70g  Pre-Op Goals Reviewed with the Patient . Track your food and beverages using MyFitness Pal or the [employer] app . Make healthy food choices . Avoid concentrated sugars and fried foods . Keep fat & sugar in the single digits per serving on food labels . Practice CHEWING your food (aim for applesauce consistency) . Practice not drinking 15 minutes before, during, and 30 minutes after each meal/snack . Avoid all carbonated beverages (ex: soda, sparkling beverages)  . Limit caffeinated beverages (ex: coffee, tea, energy drinks) . Avoid all sugar-sweetened beverages (ex: regular soda, sports drinks)  . Avoid alcohol  . Consume 3 meals per day or try to eat every 3-5 hours . Make a list of non-food related activities . Aim for 64-100 ounces of fluid daily (with at least half of fluid intake being plain water)  . Aim for at least 60-80 grams of protein daily . Look for a liquid protein source that contains ?15 g protein and ?5 g carbohydrate (ex: shakes, drinks, shots) . Physical activity is an important part of a healthy lifestyle so keep it moving!  *Goals that are bolded indicate the pt would like to start working towards these  Handouts Provided Include  . Pre-Op Goals . Bariatric Surgery Vitamins & Minerals . Bariatric Surgery Protein Shakes  Learning Style & Readiness for Change Teaching method utilized: Visual & Auditory  Demonstrated degree of understanding via: Teach Back  Barriers to learning/adherence to lifestyle change: none identified  Next Steps Supervised Weight Loss (SWL) Visits Needed: 6  Patient is to call [facility] Education [facility] to enroll in Pre-Op Class (>2 weeks before surgery) and Post-Op Class (2 weeks after surgery) for further nutrition education when  surgery date is scheduled.

## 2018-11-03 DIAGNOSIS — Z1211 Encounter for screening for malignant neoplasm of colon: Secondary | ICD-10-CM | POA: Diagnosis not present

## 2018-11-03 DIAGNOSIS — M25562 Pain in left knee: Secondary | ICD-10-CM | POA: Diagnosis not present

## 2018-11-03 DIAGNOSIS — I10 Essential (primary) hypertension: Secondary | ICD-10-CM | POA: Diagnosis not present

## 2018-11-03 DIAGNOSIS — G4733 Obstructive sleep apnea (adult) (pediatric): Secondary | ICD-10-CM | POA: Diagnosis not present

## 2018-11-03 DIAGNOSIS — E1151 Type 2 diabetes mellitus with diabetic peripheral angiopathy without gangrene: Secondary | ICD-10-CM | POA: Diagnosis not present

## 2018-11-03 DIAGNOSIS — E7800 Pure hypercholesterolemia, unspecified: Secondary | ICD-10-CM | POA: Diagnosis not present

## 2018-11-05 DIAGNOSIS — M25562 Pain in left knee: Secondary | ICD-10-CM | POA: Diagnosis not present

## 2018-11-05 DIAGNOSIS — Z6841 Body Mass Index (BMI) 40.0 and over, adult: Secondary | ICD-10-CM | POA: Diagnosis not present

## 2018-11-15 ENCOUNTER — Ambulatory Visit (INDEPENDENT_AMBULATORY_CARE_PROVIDER_SITE_OTHER): Payer: Medicare HMO | Admitting: Surgery

## 2018-11-15 ENCOUNTER — Encounter: Payer: Self-pay | Admitting: Surgery

## 2018-11-15 ENCOUNTER — Other Ambulatory Visit: Payer: Self-pay

## 2018-11-15 VITALS — BP 152/88 | HR 94 | Ht 73.0 in | Wt >= 6400 oz

## 2018-11-15 DIAGNOSIS — I714 Abdominal aortic aneurysm, without rupture, unspecified: Secondary | ICD-10-CM

## 2018-11-15 DIAGNOSIS — L02419 Cutaneous abscess of limb, unspecified: Secondary | ICD-10-CM | POA: Diagnosis not present

## 2018-11-15 NOTE — Progress Notes (Signed)
[facility] [place]  Patient name: Bryan Wells MRN: [id number] DOB: 03-25-1961 Sex: male   REQUESTING PROVIDER:    Dr. Tyrone Sage   REASON FOR CONSULT:    AAA  HISTORY OF PRESENT ILLNESS:   Bryan Wells is a 57 y.o. male, who is referred for evaluation of a infrarenal abdominal aortic aneurysm.  His most recent CT scan shows that this has increased to 5.1 cm.  In 2009, the patient has a history of a type B aortic dissection.  This was treated in [place] by Dr. Samule Ohm.  He had 2 bare-metal stents placed in 2 bilateral iliac arteries secondary to malperfusion of both legs.  The patient has a significant issue with morbid obesity.  He is currently being evaluated for gastric bypass surgery.  He suffers from type 2 diabetes.  He takes a statin for hypercholesterolemia.  He is medically managed for hypertension.  PAST MEDICAL HISTORY    Past Medical History:  Diagnosis Date  . AAA (abdominal aortic aneurysm) (HCC)   . Bell's palsy   . Diabetes mellitus without complication (HCC)    type 2  . Hyperlipidemia    hypercholesterolemia  . Hypertension   . Obesity   . Sleep apnea      FAMILY HISTORY   Family History  Problem Relation Age of Onset  . Diabetes Father     SOCIAL HISTORY:   Social History   Socioeconomic History  . Marital status: Divorced    Spouse name: Not on file  . Number of children: Not on file  . Years of education: Not on file  . Highest education level: Not on file  Occupational History  . Not on file  Social Needs  . Financial resource strain: Not on file  . Food insecurity:    Worry: Never true    Inability: Never true  . Transportation needs:    Medical: Not on file    Non-medical: Not on file  Tobacco Use  . Smoking status: Never Smoker  . Smokeless tobacco: Never Used  Substance and Sexual Activity  . Alcohol use: No  . Drug use: No  . Sexual activity: Not on file  Lifestyle   . Physical activity:    Days per week: Not on file    Minutes per session: Not on file  . Stress: Not on file  Relationships  . Social connections:    Talks on phone: Not on file    Gets together: Not on file    Attends religious service: Not on file    Active member of club or organization: Not on file    Attends meetings of clubs or organizations: Not on file    Relationship status: Not on file  . Intimate partner violence:    Fear of current or ex partner: Not on file    Emotionally abused: Not on file    Physically abused: Not on file    Forced sexual activity: Not on file  Other Topics Concern  . Not on file  Social History Narrative  . Not on file    ALLERGIES:    Allergies  Allergen Reactions  . Penicillins Other (See Comments)    Loss of consciousness  Has patient had a PCN reaction causing immediate rash, facial/tongue/throat swelling, SOB or lightheadedness with hypotension: Yes Has patient had a PCN reaction causing severe rash involving mucus membranes or skin necrosis: No Has patient had a PCN reaction that required hospitalization: Unknown Has patient  had a PCN reaction occurring within the last 10 years: No If all of the above answers are "NO", then may proceed with Cephalosporin use.     CURRENT MEDICATIONS:    Current Outpatient Medications  Medication Sig Dispense Refill  . ACCU-CHEK AVIVA PLUS test strip U TID PRN  4  . atorvastatin (LIPITOR) 10 MG tablet Take 10 mg by mouth daily.    Marland Kitchen diltiazem (CARDIZEM CD) 240 MG 24 hr capsule Take 240 mg by mouth 2 (two) times daily.     . furosemide (LASIX) 40 MG tablet Take 40 mg by mouth daily.     Marland Kitchen glucose blood (ACCU-CHEK AVIVA PLUS) test strip TEST ONCE DAILY    . labetalol (NORMODYNE) 300 MG tablet TK 1 T PO BID  3  . metFORMIN (GLUCOPHAGE) 500 MG tablet Take 500 mg by mouth 2 (two) times daily with a meal.     . omeprazole (PRILOSEC) 20 MG capsule Take 20 mg by mouth daily.    . TRESIBA FLEXTOUCH  200 UNIT/ML SOPN     . aspirin EC 81 MG tablet Take 81 mg by mouth daily.    . ciclopirox (LOPROX) 0.77 % cream Apply to both feet and between toes twice daily for 4 weeks. (Patient not taking: Reported on 11/15/2018) 30 g 1  . clindamycin (CLEOCIN) 150 MG capsule Take 3 capsules (450 mg total) by mouth 3 (three) times daily. (Patient not taking: Reported on 06/10/2017) 90 capsule 0  . clotrimazole-betamethasone (LOTRISONE) cream APPLY TO AFFECTED AREA TWICE A DAY  0  . doxycycline (VIBRA-TABS) 100 MG tablet TK 1 T PO BID FOR 5 DAYS  0  . ibuprofen (ADVIL,MOTRIN) 200 MG tablet Take 400 mg by mouth 2 (two) times daily as needed for mild pain or moderate pain.    . Insulin Degludec 200 UNIT/ML SOPN Inject into the skin.    Marland Kitchen labetalol (NORMODYNE) 200 MG tablet Take 300 mg by mouth 2 (two) times daily.     . metFORMIN (GLUCOPHAGE) 1000 MG tablet TK 1 T PO BID WC  1  . naproxen (NAPROSYN) 500 MG tablet Take 1 tablet (500 mg total) by mouth 2 (two) times daily. (Patient not taking: Reported on 09/13/2017) 30 tablet 0  . predniSONE (DELTASONE) 20 MG tablet Take 3 tablets (60 mg) daily for 5 days. Then take 2 tablets (40 mg) x 2 days. Then take 1 tablet (20 mg) x 3 days. (Patient not taking: Reported on 09/13/2017) 22 tablet 0  . traMADol (ULTRAM) 50 MG tablet Take 1 tablet (50 mg total) by mouth every 6 (six) hours as needed. (Patient not taking: Reported on 06/10/2017) 15 tablet 0   No current facility-administered medications for this visit.     REVIEW OF SYSTEMS:   [X]  denotes positive finding, [ ]  denotes negative finding Cardiac  Comments:  Chest pain or chest pressure:    Shortness of breath upon exertion:    Short of breath when lying flat:    Irregular heart rhythm:        Vascular    Pain in calf, thigh, or hip brought on by ambulation:    Pain in feet at night that wakes you up from your sleep:     Blood clot in your veins:    Leg swelling:         Pulmonary    Oxygen at home:      Productive cough:     Wheezing:  Neurologic    Sudden weakness in arms or legs:     Sudden numbness in arms or legs:     Sudden onset of difficulty speaking or slurred speech:    Temporary loss of vision in one eye:     Problems with dizziness:         Gastrointestinal    Blood in stool:      Vomited blood:         Genitourinary    Burning when urinating:     Blood in urine:        Psychiatric    Major depression:         Hematologic    Bleeding problems:    Problems with blood clotting too easily:        Skin    Rashes or ulcers:        Constitutional    Fever or chills:     PHYSICAL EXAM:   Vitals:   11/15/18 1001  BP: (!) 152/88  Pulse: 94  Weight: (!) 435 lb (197.3 kg)  Height: 6\' 1"  (1.854 m)    GENERAL: The patient is a well-nourished male, in no acute distress. The vital signs are documented above. CARDIAC: There is a regular rate and rhythm.  VASCULAR: non-palpable pedal pulses PULMONARY: Nonlabored respirations ABDOMEN: Soft and non-tender with normal pitched bowel sounds.  MUSCULOSKELETAL: There are no major deformities or cyanosis. NEUROLOGIC: No focal weakness or paresthesias are detected. SKIN: There are no ulcers or rashes noted. PSYCHIATRIC: The patient has a normal affect.  STUDIES:   I have reviewed the CTA 1. Stable type B aortic dissection extending from just beyond the left subclavian origin through the abdominal aorta. 2. 4.7 cm proximal descending thoracic aortic aneurysm, previously 4.6 cm. 3. 5.1 cm infrarenal abdominal aortic aneurysm, previously 4.7 cm.   ASSESSMENT and PLAN   Type B aortic dissection with aneurysmal degeneration of the infrarenal abdominal aorta: After reviewing his CT scan, and given the fact that his dissection was 10 years ago, I do not think that he is a good endovascular candidate.  He will likely require a open infrarenal abdominal aortic aneurysm repair either going to the femoral arteries or  the external iliac arteries in order to exclude his existing stents.  I think he is a very high risk for complications for the surgery given his morbid obesity.  He is currently being evaluated for gastric bypass surgery which I think would be a good idea prior to open aneurysm repair.  I would recommend considering open repair once his aneurysm reaches 5.5 cm.  He will follow-up with me in 6 months with a repeat CT scan.   Durene CalWells Cattleya Dobratz, MD [facility] Tel [phone] Pager [phone]

## 2018-11-17 ENCOUNTER — Ambulatory Visit: Payer: Medicare HMO | Admitting: Skilled Nursing Facility1

## 2019-01-13 ENCOUNTER — Ambulatory Visit: Payer: Medicare HMO | Admitting: Podiatry

## 2019-01-17 ENCOUNTER — Other Ambulatory Visit: Payer: Self-pay | Admitting: Gastroenterology

## 2019-01-17 DIAGNOSIS — R12 Heartburn: Secondary | ICD-10-CM | POA: Diagnosis not present

## 2019-01-17 DIAGNOSIS — Z1211 Encounter for screening for malignant neoplasm of colon: Secondary | ICD-10-CM | POA: Diagnosis not present

## 2019-01-17 DIAGNOSIS — Z6841 Body Mass Index (BMI) 40.0 and over, adult: Secondary | ICD-10-CM | POA: Diagnosis not present

## 2019-02-18 DIAGNOSIS — E1169 Type 2 diabetes mellitus with other specified complication: Secondary | ICD-10-CM | POA: Diagnosis not present

## 2019-02-18 DIAGNOSIS — E1151 Type 2 diabetes mellitus with diabetic peripheral angiopathy without gangrene: Secondary | ICD-10-CM | POA: Diagnosis not present

## 2019-02-18 DIAGNOSIS — I10 Essential (primary) hypertension: Secondary | ICD-10-CM | POA: Diagnosis not present

## 2019-02-21 ENCOUNTER — Encounter (HOSPITAL_COMMUNITY): Payer: Self-pay | Admitting: *Deleted

## 2019-02-21 ENCOUNTER — Other Ambulatory Visit: Payer: Self-pay

## 2019-02-22 ENCOUNTER — Ambulatory Visit (HOSPITAL_COMMUNITY): Payer: Medicare HMO | Admitting: Anesthesiology

## 2019-02-22 ENCOUNTER — Encounter (HOSPITAL_COMMUNITY): Payer: Self-pay

## 2019-02-22 ENCOUNTER — Encounter (HOSPITAL_COMMUNITY)
Admission: RE | Disposition: A | Discharge status: Home / Self Care | Payer: Self-pay | Source: Home / Self Care | Attending: Gastroenterology

## 2019-02-22 ENCOUNTER — Ambulatory Visit (HOSPITAL_COMMUNITY)
Admission: RE | Admit: 2019-02-22 | Discharge: 2019-02-22 | Disposition: A | Discharge status: Home / Self Care | Payer: Medicare HMO | Attending: Gastroenterology | Admitting: Gastroenterology

## 2019-02-22 ENCOUNTER — Other Ambulatory Visit: Payer: Self-pay

## 2019-02-22 DIAGNOSIS — I10 Essential (primary) hypertension: Secondary | ICD-10-CM | POA: Insufficient documentation

## 2019-02-22 DIAGNOSIS — G4730 Sleep apnea, unspecified: Secondary | ICD-10-CM | POA: Diagnosis not present

## 2019-02-22 DIAGNOSIS — R12 Heartburn: Secondary | ICD-10-CM | POA: Insufficient documentation

## 2019-02-22 DIAGNOSIS — Z1211 Encounter for screening for malignant neoplasm of colon: Secondary | ICD-10-CM | POA: Diagnosis not present

## 2019-02-22 DIAGNOSIS — I739 Peripheral vascular disease, unspecified: Secondary | ICD-10-CM | POA: Insufficient documentation

## 2019-02-22 DIAGNOSIS — Z87891 Personal history of nicotine dependence: Secondary | ICD-10-CM | POA: Insufficient documentation

## 2019-02-22 DIAGNOSIS — E119 Type 2 diabetes mellitus without complications: Secondary | ICD-10-CM | POA: Diagnosis not present

## 2019-02-22 DIAGNOSIS — Z6841 Body Mass Index (BMI) 40.0 and over, adult: Secondary | ICD-10-CM | POA: Insufficient documentation

## 2019-02-22 DIAGNOSIS — E1151 Type 2 diabetes mellitus with diabetic peripheral angiopathy without gangrene: Secondary | ICD-10-CM | POA: Diagnosis not present

## 2019-02-22 HISTORY — DX: Dependence on other enabling machines and devices: Z99.89

## 2019-02-22 HISTORY — PX: COLONOSCOPY WITH PROPOFOL: SHX5780

## 2019-02-22 HISTORY — DX: Personal history of other diseases of the circulatory system: Z86.79

## 2019-02-22 HISTORY — DX: Presence of spectacles and contact lenses: Z97.3

## 2019-02-22 HISTORY — DX: Body Mass Index (BMI) 40.0 and over, adult: Z684

## 2019-02-22 HISTORY — DX: Old myocardial infarction: I25.2

## 2019-02-22 HISTORY — DX: Obstructive sleep apnea (adult) (pediatric): G47.33

## 2019-02-22 HISTORY — PX: ESOPHAGOGASTRODUODENOSCOPY (EGD) WITH PROPOFOL: SHX5813

## 2019-02-22 HISTORY — DX: Morbid (severe) obesity due to excess calories: E66.01

## 2019-02-22 HISTORY — DX: Presence of dental prosthetic device (complete) (partial): Z97.2

## 2019-02-22 HISTORY — DX: Long term (current) use of insulin: Z79.4

## 2019-02-22 HISTORY — DX: Type 2 diabetes mellitus without complications: E11.9

## 2019-02-22 LAB — GLUCOSE, CAPILLARY: Glucose-Capillary: 111 mg/dL — ABNORMAL HIGH (ref 70–99)

## 2019-02-22 SURGERY — ESOPHAGOGASTRODUODENOSCOPY (EGD) WITH PROPOFOL
Anesthesia: Monitor Anesthesia Care

## 2019-02-22 MED ORDER — LACTATED RINGERS IV SOLN
INTRAVENOUS | Status: DC | PRN
  Administered 2019-02-22: 12:00:00 via INTRAVENOUS

## 2019-02-22 MED ORDER — PROPOFOL 10 MG/ML IV BOLUS
INTRAVENOUS | Status: AC
  Filled 2019-02-22: qty 60

## 2019-02-22 MED ORDER — PROPOFOL 500 MG/50ML IV EMUL
INTRAVENOUS | Status: DC | PRN
  Administered 2019-02-22: 140 ug/kg/min via INTRAVENOUS

## 2019-02-22 MED ORDER — ONDANSETRON HCL 4 MG/2ML IJ SOLN
INTRAMUSCULAR | Status: DC | PRN
  Administered 2019-02-22: 4 mg via INTRAVENOUS

## 2019-02-22 MED ORDER — PROPOFOL 500 MG/50ML IV EMUL
INTRAVENOUS | Status: DC | PRN
  Administered 2019-02-22: 50 mg via INTRAVENOUS
  Administered 2019-02-22: 40 mg via INTRAVENOUS

## 2019-02-22 SURGICAL SUPPLY — 25 items

## 2019-02-22 NOTE — Anesthesia Postprocedure Evaluation (Signed)
Anesthesia Post Note  Patient: Bryan Wells  Procedure(s) Performed: ESOPHAGOGASTRODUODENOSCOPY (EGD) WITH PROPOFOL (N/A ) COLONOSCOPY WITH PROPOFOL (N/A )     Patient location during evaluation: PACU Anesthesia Type: MAC Level of consciousness: awake and alert Pain management: pain level controlled Vital Signs Assessment: post-procedure vital signs reviewed and stable Respiratory status: spontaneous breathing, nonlabored ventilation, respiratory function stable and patient connected to nasal cannula oxygen Cardiovascular status: stable and blood pressure returned to baseline Postop Assessment: no apparent nausea or vomiting Anesthetic complications: no    Last Vitals:  Vitals:   02/22/19 1320 02/22/19 1330  BP: 109/76 (!) 157/83  Pulse: 77 73  Resp: 14 14  Temp:    SpO2: 97% 97%    Last Pain:  Vitals:   02/22/19 1310  TempSrc: Oral  PainSc: 0-No pain                 Mickala Laton S

## 2019-02-22 NOTE — Discharge Instructions (Signed)

## 2019-02-22 NOTE — H&P (Signed)
  The patient is a 58 year old male who is here today to have a screening colonoscopy and an EGD.  His EGD is being done because of heartburn.  He will be undergoing bariatric surgery in the near future.  Physical:  No distress  Heart regular rhythm no murmurs  Lungs clear  Abdomen soft and nontender  Impression: Heartburn, screening for colon cancer  Plan: EGD and colonoscopy

## 2019-02-22 NOTE — Anesthesia Preprocedure Evaluation (Signed)
Anesthesia Evaluation  Patient identified by MRN, date of birth, ID band Patient awake    Reviewed: Allergy & Precautions, NPO status , Patient's Chart, lab work & pertinent test results  Airway Mallampati: II  TM Distance: >3 FB Neck ROM: Full    Dental no notable dental hx.    Pulmonary neg pulmonary ROS, sleep apnea , former smoker,    Pulmonary exam normal breath sounds clear to auscultation       Cardiovascular hypertension, Pt. on medications + Peripheral Vascular Disease  Normal cardiovascular exam Rhythm:Regular Rate:Normal     Neuro/Psych negative neurological ROS  negative psych ROS   GI/Hepatic negative GI ROS, Neg liver ROS,   Endo/Other  negative endocrine ROSdiabetesMorbid obesity  Renal/GU negative Renal ROS  negative genitourinary   Musculoskeletal negative musculoskeletal ROS (+)   Abdominal   Peds negative pediatric ROS (+)  Hematology negative hematology ROS (+)   Anesthesia Other Findings   Reproductive/Obstetrics negative OB ROS                             Anesthesia Physical Anesthesia Plan  ASA: III  Anesthesia Plan: MAC   Post-op Pain Management:    Induction:   PONV Risk Score and Plan: 1 and Treatment may vary due to age or medical condition  Airway Management Planned: Simple Face Mask and Nasal Cannula  Additional Equipment:   Intra-op Plan:   Post-operative Plan:   Informed Consent: I have reviewed the patients History and Physical, chart, labs and discussed the procedure including the risks, benefits and alternatives for the proposed anesthesia with the patient or authorized representative who has indicated his/her understanding and acceptance.     Dental advisory given  Plan Discussed with: CRNA  Anesthesia Plan Comments:         Anesthesia Quick Evaluation

## 2019-02-22 NOTE — Transfer of Care (Signed)
Immediate Anesthesia Transfer of Care Note  Patient: Bryan Wells  Procedure(s) Performed: Procedure(s): ESOPHAGOGASTRODUODENOSCOPY (EGD) WITH PROPOFOL (N/A) COLONOSCOPY WITH PROPOFOL (N/A)  Patient Location: PACU  Anesthesia Type:MAC  Level of Consciousness:  sedated, patient cooperative and responds to stimulation  Airway & Oxygen Therapy:Patient Spontanous Breathing and Patient connected to face mask oxgen  Post-op Assessment:  Report given to PACU RN and Post -op Vital signs reviewed and stable  Post vital signs:  Reviewed and stable  Last Vitals:  Vitals:   02/22/19 1125  BP: (!) 158/73  Pulse: 84  Resp: (!) 26  Temp: 36.5 C  SpO2: 05%    Complications: No apparent anesthesia complications

## 2019-02-23 ENCOUNTER — Ambulatory Visit: Payer: Medicare HMO | Admitting: Podiatry

## 2019-02-23 DIAGNOSIS — M79675 Pain in left toe(s): Secondary | ICD-10-CM

## 2019-02-23 DIAGNOSIS — B351 Tinea unguium: Secondary | ICD-10-CM

## 2019-02-23 DIAGNOSIS — M79674 Pain in right toe(s): Secondary | ICD-10-CM | POA: Diagnosis not present

## 2019-02-23 NOTE — Op Note (Signed)
[facility] Patient Name: Bryan CiscoKevin Wells Procedure Date: 02/22/2019 MRN: [id number] Attending MD: Graylin ShiverSalem F Frans Valente , MD Date of Birth: 1961/05/09 CSN: [id number] Age: 58 Admit Type: Outpatient Procedure:                Upper GI endoscopy Indications:              Heartburn Providers:                Graylin ShiverSalem F. Amier Hoyt, MD, Dwain SarnaPatricia Ford, RN, Zoila ShutterGary Bryant,                            Technician, Greig RightLogan Key, CRNA Referring MD:              Medicines:                Propofol per Anesthesia Complications:            No immediate complications. Estimated Blood Loss:     Estimated blood loss: none. Procedure:                Pre-Anesthesia Assessment:                           - Prior to the procedure, a History and Physical                            was performed, and patient medications and                            allergies were reviewed. The patient's tolerance of                            previous anesthesia was also reviewed. The risks                            and benefits of the procedure and the sedation                            options and risks were discussed with the patient.                            All questions were answered, and informed consent                            was obtained. Prior Anticoagulants: The patient has                            taken no previous anticoagulant or antiplatelet                            agents. ASA Grade Assessment: III - A patient with                            severe systemic disease. After reviewing the risks  and benefits, the patient was deemed in                            satisfactory condition to undergo the procedure.                           After obtaining informed consent, the endoscope was                            passed under direct vision. Throughout the                            procedure, the patient's blood pressure, pulse, and                            oxygen saturations were  monitored continuously. The                            [id number]-H190 ([id number]) Olympus gastroscope was                            introduced through the mouth, and advanced to the                            second part of duodenum. The upper GI endoscopy was                            accomplished without difficulty. The patient                            tolerated the procedure well. Scope In: Scope Out: Findings:      The examined esophagus was normal. EG junction at 40cm.      The entire examined stomach was normal.      The examined duodenum was normal. Impression:               - Normal esophagus.                           - Normal stomach.                           - Normal examined duodenum.                           - No specimens collected. Moderate Sedation:      . Recommendation:           - Resume regular diet.                           - Continue present medications.                           - Return to referring physician. Procedure Code(s):        --- Professional ---  73403, Esophagogastroduodenoscopy, flexible,                            transoral; diagnostic, including collection of                            specimen(s) by brushing or washing, when performed                            (separate procedure) Diagnosis Code(s):        --- Professional ---                           R12, Heartburn CPT copyright 2018 American Medical Association. All rights reserved. The codes documented in this report are preliminary and upon coder review may  be revised to meet current compliance requirements. Graylin Shiver, MD 02/22/2019 1:07:34 PM This report has been signed electronically. Number of Addenda: 0

## 2019-02-23 NOTE — Patient Instructions (Signed)
Diabetes Mellitus and Foot Care Foot care is an important part of your health, especially when you have diabetes. Diabetes may cause you to have problems because of poor blood flow (circulation) to your feet and legs, which can cause your skin to:  Become thinner and drier.  Break more easily.  Heal more slowly.  Peel and crack. You may also have nerve damage (neuropathy) in your legs and feet, causing decreased feeling in them. This means that you may not notice minor injuries to your feet that could lead to more serious problems. Noticing and addressing any potential problems early is the best way to prevent future foot problems. How to care for your feet Foot hygiene  Wash your feet daily with warm water and mild soap. Do not use hot water. Then, pat your feet and the areas between your toes until they are completely dry. Do not soak your feet as this can dry your skin.  Trim your toenails straight across. Do not dig under them or around the cuticle. File the edges of your nails with an emery board or nail file.  Apply a moisturizing lotion or petroleum jelly to the skin on your feet and to dry, brittle toenails. Use lotion that does not contain alcohol and is unscented. Do not apply lotion between your toes. Shoes and socks  Wear clean socks or stockings every day. Make sure they are not too tight. Do not wear knee-high stockings since they may decrease blood flow to your legs.  Wear shoes that fit properly and have enough cushioning. Always look in your shoes before you put them on to be sure there are no objects inside.  To break in new shoes, wear them for just a few hours a day. This prevents injuries on your feet. Wounds, scrapes, corns, and calluses  Check your feet daily for blisters, cuts, bruises, sores, and redness. If you cannot see the bottom of your feet, use a mirror or ask someone for help.  Do not cut corns or calluses or try to remove them with medicine.  If you  find a minor scrape, cut, or break in the skin on your feet, keep it and the skin around it clean and dry. You may clean these areas with mild soap and water. Do not clean the area with peroxide, alcohol, or iodine.  If you have a wound, scrape, corn, or callus on your foot, look at it several times a day to make sure it is healing and not infected. Check for: ? Redness, swelling, or pain. ? Fluid or blood. ? Warmth. ? Pus or a bad smell. General instructions  Do not cross your legs. This may decrease blood flow to your feet.  Do not use heating pads or hot water bottles on your feet. They may burn your skin. If you have lost feeling in your feet or legs, you may not know this is happening until it is too late.  Protect your feet from hot and cold by wearing shoes, such as at the beach or on hot pavement.  Schedule a complete foot exam at least once a year (annually) or more often if you have foot problems. If you have foot problems, report any cuts, sores, or bruises to your health care provider immediately. Contact a health care provider if:  You have a medical condition that increases your risk of infection and you have any cuts, sores, or bruises on your feet.  You have an injury that is not   healing.  You have redness on your legs or feet.  You feel burning or tingling in your legs or feet.  You have pain or cramps in your legs and feet.  Your legs or feet are numb.  Your feet always feel cold.  You have pain around a toenail. Get help right away if:  You have a wound, scrape, corn, or callus on your foot and: ? You have pain, swelling, or redness that gets worse. ? You have fluid or blood coming from the wound, scrape, corn, or callus. ? Your wound, scrape, corn, or callus feels warm to the touch. ? You have pus or a bad smell coming from the wound, scrape, corn, or callus. ? You have a fever. ? You have a red line going up your leg. Summary  Check your feet every day  for cuts, sores, red spots, swelling, and blisters.  Moisturize feet and legs daily.  Wear shoes that fit properly and have enough cushioning.  If you have foot problems, report any cuts, sores, or bruises to your health care provider immediately.  Schedule a complete foot exam at least once a year (annually) or more often if you have foot problems. This information is not intended to replace advice given to you by your health care provider. Make sure you discuss any questions you have with your health care provider. Document Released: 12/05/2000 Document Revised: 01/20/2018 Document Reviewed: 01/09/2017 Elsevier Interactive Patient Education  2019 [employer]  Onychomycosis/Fungal Toenails  WHAT IS IT? An infection that lies within the keratin of your nail plate that is caused by a fungus.  WHY ME? Fungal infections affect all ages, sexes, races, and creeds.  There may be many factors that predispose you to a fungal infection such as age, coexisting medical conditions such as diabetes, or an autoimmune disease; stress, medications, fatigue, genetics, etc.  Bottom line: fungus thrives in a warm, moist environment and your shoes offer such a location.  IS IT CONTAGIOUS? Theoretically, yes.  You do not want to share shoes, nail clippers or files with someone who has fungal toenails.  Walking around barefoot in the same room or sleeping in the same bed is unlikely to transfer the organism.  It is important to realize, however, that fungus can spread easily from one nail to the next on the same foot.  HOW DO WE TREAT THIS?  There are several ways to treat this condition.  Treatment may depend on many factors such as age, medications, pregnancy, liver and kidney conditions, etc.  It is best to ask your doctor which options are available to you.  1. No treatment.   Unlike many other medical concerns, you can live with this condition.  However for many people this can be a painful condition and  may lead to ingrown toenails or a bacterial infection.  It is recommended that you keep the nails cut short to help reduce the amount of fungal nail. 2. Topical treatment.  These range from herbal remedies to prescription strength nail lacquers.  About 40-50% effective, topicals require twice daily application for approximately 9 to 12 months or until an entirely new nail has grown out.  The most effective topicals are medical grade medications available through physicians offices. 3. Oral antifungal medications.  With an 80-90% cure rate, the most common oral medication requires 3 to 4 months of therapy and stays in your system for a year as the new nail grows out.  Oral antifungal medications do require   blood work to make sure it is a safe drug for you.  A liver function panel will be performed prior to starting the medication and after the first month of treatment.  It is important to have the blood work performed to avoid any harmful side effects.  In general, this medication safe but blood work is required. 4. Laser Therapy.  This treatment is performed by applying a specialized laser to the affected nail plate.  This therapy is noninvasive, fast, and non-painful.  It is not covered by insurance and is therefore, out of pocket.  The results have been very good with a 80-95% cure rate.  [facility] is the only practice in the area to offer this therapy. 5. Permanent Nail Avulsion.  Removing the entire nail so that a new nail will not grow back. 

## 2019-02-23 NOTE — Op Note (Signed)
[facility] Patient Name: Bryan Wells Procedure Date: 02/22/2019 MRN: [id number] Attending MD: Graylin Shiver , MD Date of Birth: February 13, 1961 CSN: [id number] Age: 58 Admit Type: Outpatient Procedure:                Colonoscopy Indications:              Screening for colorectal malignant neoplasm, This                            is the patient's first colonoscopy Providers:                Graylin Shiver, MD, Dwain Sarna, RN, Zoila Shutter,                            Technician, Greig Right, CRNA Referring MD:              Medicines:                Propofol per Anesthesia Complications:            No immediate complications. Estimated Blood Loss:     Estimated blood loss: none. Procedure:                Pre-Anesthesia Assessment:                           - Prior to the procedure, a History and Physical                            was performed, and patient medications and                            allergies were reviewed. The patient's tolerance of                            previous anesthesia was also reviewed. The risks                            and benefits of the procedure and the sedation                            options and risks were discussed with the patient.                            All questions were answered, and informed consent                            was obtained. Prior Anticoagulants: The patient has                            taken no previous anticoagulant or antiplatelet                            agents. ASA Grade Assessment: III - A patient with  severe systemic disease. After reviewing the risks                            and benefits, the patient was deemed in                            satisfactory condition to undergo the procedure.                           - Prior to the procedure, a History and Physical                            was performed, and patient medications and                            allergies  were reviewed. The patient's tolerance of                            previous anesthesia was also reviewed. The risks                            and benefits of the procedure and the sedation                            options and risks were discussed with the patient.                            All questions were answered, and informed consent                            was obtained. Prior Anticoagulants: The patient has                            taken no previous anticoagulant or antiplatelet                            agents. ASA Grade Assessment: III - A patient with                            severe systemic disease. After reviewing the risks                            and benefits, the patient was deemed in                            satisfactory condition to undergo the procedure.                           After obtaining informed consent, the colonoscope                            was passed under direct vision. Throughout the  procedure, the patient's blood pressure, pulse, and                            oxygen saturations were monitored continuously. The                            PCF-H190DL ([id number]) Olympus pediatric colonscope                            was introduced through the anus and advanced to the                            the cecum, identified by appendiceal orifice and                            ileocecal valve. The ileocecal valve and the rectum                            were photographed. The colonoscopy was performed                            without difficulty. The patient tolerated the                            procedure well. The quality of the bowel                            preparation was fair. Scope In: 12:49:09 PM Scope Out: 1:02:27 PM Scope Withdrawal Time: 0 hours 5 minutes 30 seconds  Total Procedure Duration: 0 hours 13 minutes 18 seconds  Findings:      The perianal and digital rectal examinations were normal.      The  colon (entire examined portion) appeared normal. Impression:               - Preparation of the colon was fair.                           - The entire examined colon is normal.                           - No specimens collected. Moderate Sedation:      . Recommendation:           - Resume regular diet.                           - Continue present medications.                           - Repeat colonoscopy in 10 years for screening                            purposes. Procedure Code(s):        --- Professional ---  95621, Colonoscopy, flexible; diagnostic, including                            collection of specimen(s) by brushing or washing,                            when performed (separate procedure) Diagnosis Code(s):        --- Professional ---                           Z12.11, Encounter for screening for malignant                            neoplasm of colon CPT copyright 2018 American Medical Association. All rights reserved. The codes documented in this report are preliminary and upon coder review may  be revised to meet current compliance requirements. Graylin Shiver, MD 02/22/2019 1:10:53 PM This report has been signed electronically. Number of Addenda: 0

## 2019-02-24 ENCOUNTER — Encounter (HOSPITAL_COMMUNITY): Payer: Self-pay | Admitting: Gastroenterology

## 2019-03-03 ENCOUNTER — Encounter: Payer: Self-pay | Admitting: Podiatry

## 2019-03-03 NOTE — Progress Notes (Signed)
Subjective: Bryan Wells presents today with painful, thick toenails 1-5 b/l that he cannot cut and which interfere with daily activities.  Pain is aggravated when wearing enclosed shoe gear.  He states he had a colonoscopy recently.  There were no polyps found.  He will also be having it  [name], MD his PCP.   Current Outpatient Medications:  .  aspirin EC 81 MG tablet, Take 81 mg by mouth daily., Disp: , Rfl:  .  atorvastatin (LIPITOR) 10 MG tablet, Take 10 mg by mouth daily., Disp: , Rfl:  .  ciclopirox (LOPROX) 0.77 % cream, Apply to both feet and between toes twice daily for 4 weeks. (Patient taking differently: Apply 1 application topically daily as needed (affected area(s) both feet and between toes). ), Disp: 30 g, Rfl: 1 .  clotrimazole-betamethasone (LOTRISONE) cream, Apply 1 application topically 2 (two) times daily as needed (for skin fold/skin irritation.). , Disp: , Rfl: 0 .  diltiazem (CARDIZEM CD) 240 MG 24 hr capsule, Take 240 mg by mouth at bedtime. , Disp: , Rfl:  .  furosemide (LASIX) 40 MG tablet, Take 40 mg by mouth daily. , Disp: , Rfl:  .  ibuprofen (ADVIL,MOTRIN) 200 MG tablet, Take 600 mg by mouth every 8 (eight) hours as needed for mild pain (pain.). , Disp: , Rfl:  .  labetalol (NORMODYNE) 300 MG tablet, Take 300 mg by mouth at bedtime. , Disp: , Rfl: 3 .  meloxicam (MOBIC) 15 MG tablet, TK 1 T PO QD WITH FOOD FOR 2 WEEK THEN PRN, Disp: , Rfl:  .  metFORMIN (GLUCOPHAGE) 1000 MG tablet, Take 500 mg by mouth 2 (two) times daily. , Disp: , Rfl: 1 .  omeprazole (PRILOSEC) 20 MG capsule, Take 20 mg by mouth daily before breakfast. , Disp: , Rfl:  .  TRESIBA FLEXTOUCH 200 UNIT/ML SOPN, Inject 70 Units into the skin at bedtime. , Disp: , Rfl:   Allergies  Allergen Reactions  . Lisinopril Swelling  . Penicillins Other (See Comments)    Loss of consciousness  Has patient had a PCN reaction causing immediate rash, facial/tongue/throat swelling, SOB or  lightheadedness with hypotension: Yes Has patient had a PCN reaction causing severe rash involving mucus membranes or skin necrosis: No Has patient had a PCN reaction that required hospitalization: Unknown Has patient had a PCN reaction occurring within the last 10 years: No If all of the above answers are "NO", then may proceed with Cephalosporin use.     Objective:  Vascular Examination: Capillary refill time less than 3 seconds x 10 digits.  Dorsalis pedis and Posterior tibial pulses palpable b/l.  Digital hair absent x 10 digits.  Skin temperature gradient WNL b/l.  Dermatological Examination: Skin with normal turgor, texture and tone b/l  Toenails 1-5 b/l discolored, thick, dystrophic with subungual debris and pain with palpation to nailbeds due to thickness of nails.  Musculoskeletal: Muscle strength 5/5 to all LE muscle groups  No gross bony deformities b/l.  No pain, crepitus or joint limitation noted with ROM.   Neurological: Sensation intact with 10 gram monofilament.  Vibratory sensation intact.  Assessment: Painful onychomycosis toenails 1-5 b/l   Plan: 1. Toenails 1-5 b/l were debrided in length and girth without iatrogenic bleeding. 2. Patient to continue soft, supportive shoe gear. 3. Patient to report any pedal injuries to medical professional immediately. 4. Follow up 3 months.  5. Patient/POA to call should there be a concern in the interim.

## 2019-03-04 ENCOUNTER — Other Ambulatory Visit: Payer: Self-pay

## 2019-03-04 DIAGNOSIS — I714 Abdominal aortic aneurysm, without rupture, unspecified: Secondary | ICD-10-CM

## 2019-03-09 DIAGNOSIS — E1151 Type 2 diabetes mellitus with diabetic peripheral angiopathy without gangrene: Secondary | ICD-10-CM | POA: Diagnosis not present

## 2019-03-09 DIAGNOSIS — I10 Essential (primary) hypertension: Secondary | ICD-10-CM | POA: Diagnosis not present

## 2019-03-09 DIAGNOSIS — E119 Type 2 diabetes mellitus without complications: Secondary | ICD-10-CM | POA: Diagnosis not present

## 2019-03-09 DIAGNOSIS — E1169 Type 2 diabetes mellitus with other specified complication: Secondary | ICD-10-CM | POA: Diagnosis not present

## 2019-03-30 DIAGNOSIS — M25562 Pain in left knee: Secondary | ICD-10-CM | POA: Diagnosis not present

## 2019-03-30 DIAGNOSIS — M25561 Pain in right knee: Secondary | ICD-10-CM | POA: Diagnosis not present

## 2019-04-25 ENCOUNTER — Ambulatory Visit: Payer: Medicare HMO | Admitting: Surgery

## 2019-04-28 DIAGNOSIS — R070 Pain in throat: Secondary | ICD-10-CM | POA: Diagnosis not present

## 2019-05-02 ENCOUNTER — Other Ambulatory Visit: Payer: Self-pay | Admitting: Internal Medicine

## 2019-05-02 DIAGNOSIS — R1310 Dysphagia, unspecified: Secondary | ICD-10-CM | POA: Diagnosis not present

## 2019-05-02 DIAGNOSIS — R05 Cough: Secondary | ICD-10-CM | POA: Diagnosis not present

## 2019-05-02 DIAGNOSIS — J029 Acute pharyngitis, unspecified: Secondary | ICD-10-CM | POA: Diagnosis not present

## 2019-05-03 ENCOUNTER — Ambulatory Visit
Admission: RE | Admit: 2019-05-03 | Discharge: 2019-05-03 | Disposition: A | Discharge status: Home / Self Care | Payer: Medicare HMO | Source: Ambulatory Visit | Attending: Internal Medicine | Admitting: Internal Medicine

## 2019-05-03 ENCOUNTER — Other Ambulatory Visit: Payer: Self-pay | Admitting: Internal Medicine

## 2019-05-03 DIAGNOSIS — R059 Cough, unspecified: Secondary | ICD-10-CM

## 2019-05-03 DIAGNOSIS — R05 Cough: Secondary | ICD-10-CM

## 2019-05-03 DIAGNOSIS — R1310 Dysphagia, unspecified: Secondary | ICD-10-CM

## 2019-05-03 DIAGNOSIS — R0602 Shortness of breath: Secondary | ICD-10-CM | POA: Diagnosis not present

## 2019-05-05 ENCOUNTER — Telehealth: Payer: Self-pay | Admitting: *Deleted

## 2019-05-05 ENCOUNTER — Other Ambulatory Visit: Payer: Self-pay | Admitting: Radiology

## 2019-05-05 DIAGNOSIS — R0602 Shortness of breath: Secondary | ICD-10-CM

## 2019-05-05 DIAGNOSIS — Z20822 Contact with and (suspected) exposure to covid-19: Secondary | ICD-10-CM

## 2019-05-05 NOTE — Telephone Encounter (Signed)
3rd TC. No answer left VM to call back to schedule Covid-19 drive thru testing.

## 2019-05-05 NOTE — Telephone Encounter (Signed)
Pt called back to schedule covid-19 testing appt. Secured for 05/06/2019 at 1200, time per pts request. Reviewed process at arrival, pt verbalizes understanding. Order had been placed by Dr. Luvenia Starch practice.

## 2019-05-05 NOTE — Telephone Encounter (Signed)
TC from Dr. Jena Gauss at [facility]. Patient was in for a procedure on 05/03/19 who presented with SOB and vomiting during the procedure, which came in contact with 2 technicians.They were wearing protective mask he reported. No answer left VM for patient to call and schedule appointment for drive thru testing as soon as possible.  Dr. Jena Gauss requested to be notified of results due to 2 employees in close contact with this patient.

## 2019-05-05 NOTE — Telephone Encounter (Signed)
TC. No answer, left VM to call back and schedule Covid-19 drive thru test.

## 2019-05-06 ENCOUNTER — Other Ambulatory Visit: Payer: Medicare HMO

## 2019-05-06 DIAGNOSIS — Z20822 Contact with and (suspected) exposure to covid-19: Secondary | ICD-10-CM

## 2019-05-06 DIAGNOSIS — R6889 Other general symptoms and signs: Secondary | ICD-10-CM | POA: Diagnosis not present

## 2019-05-06 LAB — NOVEL CORONAVIRUS, NAA: SARS-CoV-2, NAA: NOT DETECTED

## 2019-05-09 LAB — NOVEL CORONAVIRUS, NAA: SARS-CoV-2, NAA: NOT DETECTED

## 2019-05-17 ENCOUNTER — Telehealth: Payer: Self-pay | Admitting: Internal Medicine

## 2019-05-17 NOTE — Telephone Encounter (Signed)
Pt called stating that he had a call from this number to return. Nothing in the chart to indicate anyone has called him. Pt was advised to call his PCP Dr Nehemiah Settle at [phone]. Pt states he got his COVID result.

## 2019-05-24 ENCOUNTER — Ambulatory Visit: Payer: Medicare HMO | Admitting: Podiatry

## 2019-05-25 DIAGNOSIS — E119 Type 2 diabetes mellitus without complications: Secondary | ICD-10-CM | POA: Diagnosis not present

## 2019-05-25 DIAGNOSIS — K219 Gastro-esophageal reflux disease without esophagitis: Secondary | ICD-10-CM | POA: Diagnosis not present

## 2019-05-25 DIAGNOSIS — I10 Essential (primary) hypertension: Secondary | ICD-10-CM | POA: Diagnosis not present

## 2019-05-25 DIAGNOSIS — E1169 Type 2 diabetes mellitus with other specified complication: Secondary | ICD-10-CM | POA: Diagnosis not present

## 2019-05-25 DIAGNOSIS — E7800 Pure hypercholesterolemia, unspecified: Secondary | ICD-10-CM | POA: Diagnosis not present

## 2019-05-25 DIAGNOSIS — R05 Cough: Secondary | ICD-10-CM | POA: Diagnosis not present

## 2019-05-25 DIAGNOSIS — Z794 Long term (current) use of insulin: Secondary | ICD-10-CM | POA: Diagnosis not present

## 2019-05-25 DIAGNOSIS — E1151 Type 2 diabetes mellitus with diabetic peripheral angiopathy without gangrene: Secondary | ICD-10-CM | POA: Diagnosis not present

## 2019-06-09 DIAGNOSIS — R05 Cough: Secondary | ICD-10-CM | POA: Diagnosis not present

## 2019-06-09 DIAGNOSIS — K219 Gastro-esophageal reflux disease without esophagitis: Secondary | ICD-10-CM | POA: Diagnosis not present

## 2019-07-12 DIAGNOSIS — Z7984 Long term (current) use of oral hypoglycemic drugs: Secondary | ICD-10-CM | POA: Diagnosis not present

## 2019-07-12 DIAGNOSIS — K219 Gastro-esophageal reflux disease without esophagitis: Secondary | ICD-10-CM | POA: Diagnosis not present

## 2019-07-12 DIAGNOSIS — I10 Essential (primary) hypertension: Secondary | ICD-10-CM | POA: Diagnosis not present

## 2019-07-12 DIAGNOSIS — E1169 Type 2 diabetes mellitus with other specified complication: Secondary | ICD-10-CM | POA: Diagnosis not present

## 2019-07-12 DIAGNOSIS — Z6841 Body Mass Index (BMI) 40.0 and over, adult: Secondary | ICD-10-CM | POA: Diagnosis not present

## 2019-07-12 DIAGNOSIS — E1151 Type 2 diabetes mellitus with diabetic peripheral angiopathy without gangrene: Secondary | ICD-10-CM | POA: Diagnosis not present

## 2019-07-12 DIAGNOSIS — Z125 Encounter for screening for malignant neoplasm of prostate: Secondary | ICD-10-CM | POA: Diagnosis not present

## 2019-07-12 DIAGNOSIS — G4733 Obstructive sleep apnea (adult) (pediatric): Secondary | ICD-10-CM | POA: Diagnosis not present

## 2019-08-10 DIAGNOSIS — K029 Dental caries, unspecified: Secondary | ICD-10-CM | POA: Diagnosis not present

## 2019-08-10 DIAGNOSIS — R69 Illness, unspecified: Secondary | ICD-10-CM | POA: Diagnosis not present

## 2019-10-04 DIAGNOSIS — I10 Essential (primary) hypertension: Secondary | ICD-10-CM | POA: Diagnosis not present

## 2019-10-04 DIAGNOSIS — E1169 Type 2 diabetes mellitus with other specified complication: Secondary | ICD-10-CM | POA: Diagnosis not present

## 2019-10-04 DIAGNOSIS — Z125 Encounter for screening for malignant neoplasm of prostate: Secondary | ICD-10-CM | POA: Diagnosis not present

## 2019-10-13 DIAGNOSIS — E1169 Type 2 diabetes mellitus with other specified complication: Secondary | ICD-10-CM | POA: Diagnosis not present

## 2019-10-20 ENCOUNTER — Telehealth: Payer: Self-pay | Admitting: Surgery

## 2019-10-20 NOTE — Telephone Encounter (Signed)
Called pt 3x to schedule CT. Will send letter. °

## 2019-10-25 ENCOUNTER — Encounter: Payer: Self-pay | Admitting: Surgery

## 2019-12-21 DIAGNOSIS — R69 Illness, unspecified: Secondary | ICD-10-CM | POA: Diagnosis not present

## 2020-02-01 DIAGNOSIS — E1151 Type 2 diabetes mellitus with diabetic peripheral angiopathy without gangrene: Secondary | ICD-10-CM | POA: Diagnosis not present

## 2020-02-01 DIAGNOSIS — I10 Essential (primary) hypertension: Secondary | ICD-10-CM | POA: Diagnosis not present

## 2020-02-01 DIAGNOSIS — Z135 Encounter for screening for eye and ear disorders: Secondary | ICD-10-CM | POA: Diagnosis not present

## 2020-02-01 DIAGNOSIS — K219 Gastro-esophageal reflux disease without esophagitis: Secondary | ICD-10-CM | POA: Diagnosis not present

## 2020-02-01 DIAGNOSIS — G4733 Obstructive sleep apnea (adult) (pediatric): Secondary | ICD-10-CM | POA: Diagnosis not present

## 2020-02-01 DIAGNOSIS — E119 Type 2 diabetes mellitus without complications: Secondary | ICD-10-CM | POA: Diagnosis not present

## 2020-02-01 DIAGNOSIS — I712 Thoracic aortic aneurysm, without rupture: Secondary | ICD-10-CM | POA: Diagnosis not present

## 2020-02-01 DIAGNOSIS — Z6841 Body Mass Index (BMI) 40.0 and over, adult: Secondary | ICD-10-CM | POA: Diagnosis not present

## 2020-02-05 DIAGNOSIS — Z0189 Encounter for other specified special examinations: Secondary | ICD-10-CM | POA: Diagnosis not present

## 2020-02-10 DIAGNOSIS — H9312 Tinnitus, left ear: Secondary | ICD-10-CM | POA: Diagnosis not present

## 2020-02-10 DIAGNOSIS — G510 Bell's palsy: Secondary | ICD-10-CM | POA: Diagnosis not present

## 2020-02-21 DIAGNOSIS — L02439 Carbuncle of limb, unspecified: Secondary | ICD-10-CM | POA: Diagnosis not present

## 2020-02-21 DIAGNOSIS — R42 Dizziness and giddiness: Secondary | ICD-10-CM | POA: Diagnosis not present

## 2020-02-21 DIAGNOSIS — H9312 Tinnitus, left ear: Secondary | ICD-10-CM | POA: Diagnosis not present

## 2020-02-23 DIAGNOSIS — R42 Dizziness and giddiness: Secondary | ICD-10-CM | POA: Diagnosis not present

## 2020-02-28 DIAGNOSIS — H9122 Sudden idiopathic hearing loss, left ear: Secondary | ICD-10-CM | POA: Diagnosis not present

## 2020-02-28 DIAGNOSIS — Z8669 Personal history of other diseases of the nervous system and sense organs: Secondary | ICD-10-CM | POA: Diagnosis not present

## 2020-02-28 DIAGNOSIS — H9312 Tinnitus, left ear: Secondary | ICD-10-CM | POA: Diagnosis not present

## 2020-02-28 DIAGNOSIS — H9042 Sensorineural hearing loss, unilateral, left ear, with unrestricted hearing on the contralateral side: Secondary | ICD-10-CM | POA: Diagnosis not present

## 2020-02-28 DIAGNOSIS — Z6841 Body Mass Index (BMI) 40.0 and over, adult: Secondary | ICD-10-CM | POA: Diagnosis not present

## 2020-03-03 ENCOUNTER — Other Ambulatory Visit: Payer: Self-pay | Admitting: Otolaryngology

## 2020-03-03 DIAGNOSIS — H9122 Sudden idiopathic hearing loss, left ear: Secondary | ICD-10-CM

## 2020-03-03 DIAGNOSIS — Z8669 Personal history of other diseases of the nervous system and sense organs: Secondary | ICD-10-CM

## 2020-03-03 DIAGNOSIS — H9312 Tinnitus, left ear: Secondary | ICD-10-CM

## 2020-03-05 ENCOUNTER — Other Ambulatory Visit: Payer: Self-pay

## 2020-03-05 ENCOUNTER — Ambulatory Visit
Admission: RE | Admit: 2020-03-05 | Discharge: 2020-03-05 | Disposition: A | Discharge status: Home / Self Care | Payer: Medicare HMO | Source: Ambulatory Visit | Attending: Otolaryngology | Admitting: Otolaryngology

## 2020-03-05 DIAGNOSIS — Z8669 Personal history of other diseases of the nervous system and sense organs: Secondary | ICD-10-CM

## 2020-03-05 DIAGNOSIS — H9122 Sudden idiopathic hearing loss, left ear: Secondary | ICD-10-CM

## 2020-03-05 DIAGNOSIS — H9312 Tinnitus, left ear: Secondary | ICD-10-CM

## 2020-03-06 DIAGNOSIS — H9313 Tinnitus, bilateral: Secondary | ICD-10-CM | POA: Diagnosis not present

## 2020-03-06 DIAGNOSIS — H6123 Impacted cerumen, bilateral: Secondary | ICD-10-CM | POA: Diagnosis not present

## 2020-03-06 DIAGNOSIS — T162XXA Foreign body in left ear, initial encounter: Secondary | ICD-10-CM | POA: Diagnosis not present

## 2020-03-07 DIAGNOSIS — H9122 Sudden idiopathic hearing loss, left ear: Secondary | ICD-10-CM | POA: Diagnosis not present

## 2020-03-07 DIAGNOSIS — Z8669 Personal history of other diseases of the nervous system and sense organs: Secondary | ICD-10-CM | POA: Diagnosis not present

## 2020-03-07 DIAGNOSIS — H9312 Tinnitus, left ear: Secondary | ICD-10-CM | POA: Diagnosis not present

## 2020-03-15 DIAGNOSIS — H9122 Sudden idiopathic hearing loss, left ear: Secondary | ICD-10-CM | POA: Diagnosis not present

## 2020-03-15 DIAGNOSIS — H9312 Tinnitus, left ear: Secondary | ICD-10-CM | POA: Diagnosis not present

## 2020-03-15 DIAGNOSIS — H9042 Sensorineural hearing loss, unilateral, left ear, with unrestricted hearing on the contralateral side: Secondary | ICD-10-CM | POA: Diagnosis not present

## 2020-03-17 DIAGNOSIS — Z0100 Encounter for examination of eyes and vision without abnormal findings: Secondary | ICD-10-CM | POA: Diagnosis not present

## 2020-03-17 DIAGNOSIS — E119 Type 2 diabetes mellitus without complications: Secondary | ICD-10-CM | POA: Diagnosis not present

## 2020-04-27 DIAGNOSIS — I739 Peripheral vascular disease, unspecified: Secondary | ICD-10-CM | POA: Diagnosis not present

## 2020-04-27 DIAGNOSIS — Z6841 Body Mass Index (BMI) 40.0 and over, adult: Secondary | ICD-10-CM | POA: Diagnosis not present

## 2020-04-27 DIAGNOSIS — E1151 Type 2 diabetes mellitus with diabetic peripheral angiopathy without gangrene: Secondary | ICD-10-CM | POA: Diagnosis not present

## 2020-04-27 DIAGNOSIS — I712 Thoracic aortic aneurysm, without rupture: Secondary | ICD-10-CM | POA: Diagnosis not present

## 2020-05-16 ENCOUNTER — Encounter: Payer: Self-pay | Admitting: Podiatry

## 2020-05-16 ENCOUNTER — Ambulatory Visit: Payer: Medicare HMO | Admitting: Podiatry

## 2020-05-16 ENCOUNTER — Other Ambulatory Visit: Payer: Self-pay

## 2020-05-16 DIAGNOSIS — M79674 Pain in right toe(s): Secondary | ICD-10-CM | POA: Diagnosis not present

## 2020-05-16 DIAGNOSIS — E1165 Type 2 diabetes mellitus with hyperglycemia: Secondary | ICD-10-CM

## 2020-05-16 DIAGNOSIS — B351 Tinea unguium: Secondary | ICD-10-CM

## 2020-05-16 DIAGNOSIS — M2141 Flat foot [pes planus] (acquired), right foot: Secondary | ICD-10-CM | POA: Diagnosis not present

## 2020-05-16 DIAGNOSIS — E119 Type 2 diabetes mellitus without complications: Secondary | ICD-10-CM | POA: Diagnosis not present

## 2020-05-16 DIAGNOSIS — M79675 Pain in left toe(s): Secondary | ICD-10-CM

## 2020-05-16 DIAGNOSIS — M2142 Flat foot [pes planus] (acquired), left foot: Secondary | ICD-10-CM

## 2020-05-16 MED ORDER — NONFORMULARY OR COMPOUNDED ITEM: 3 refills | Status: AC

## 2020-05-16 NOTE — Patient Instructions (Addendum)
[facility] [phone] Antifungal nail solution Diabetes Mellitus and Foot Care Foot care is an important part of your health, especially when you have diabetes. Diabetes may cause you to have problems because of poor blood flow (circulation) to your feet and legs, which can cause your skin to:  Become thinner and drier.  Break more easily.  Heal more slowly.  Peel and crack. You may also have nerve damage (neuropathy) in your legs and feet, causing decreased feeling in them. This means that you may not notice minor injuries to your feet that could lead to more serious problems. Noticing and addressing any potential problems early is the best way to prevent future foot problems. How to care for your feet Foot hygiene  Wash your feet daily with warm water and mild soap. Do not use hot water. Then, pat your feet and the areas between your toes until they are completely dry. Do not soak your feet as this can dry your skin.  Trim your toenails straight across. Do not dig under them or around the cuticle. File the edges of your nails with an emery board or nail file.  Apply a moisturizing lotion or petroleum jelly to the skin on your feet and to dry, brittle toenails. Use lotion that does not contain alcohol and is unscented. Do not apply lotion between your toes. Shoes and socks  Wear clean socks or stockings every day. Make sure they are not too tight. Do not wear knee-high stockings since they may decrease blood flow to your legs.  Wear shoes that fit properly and have enough cushioning. Always look in your shoes before you put them on to be sure there are no objects inside.  To break in new shoes, wear them for just a few hours a day. This prevents injuries on your feet. Wounds, scrapes, corns, and calluses  Check your feet daily for blisters, cuts, bruises, sores, and redness. If you cannot see the bottom of your feet, use a mirror or ask someone for help.  Do not cut corns  or calluses or try to remove them with medicine.  If you find a minor scrape, cut, or break in the skin on your feet, keep it and the skin around it clean and dry. You may clean these areas with mild soap and water. Do not clean the area with peroxide, alcohol, or iodine.  If you have a wound, scrape, corn, or callus on your foot, look at it several times a day to make sure it is healing and not infected. Check for: ? Redness, swelling, or pain. ? Fluid or blood. ? Warmth. ? Pus or a bad smell. General instructions  Do not cross your legs. This may decrease blood flow to your feet.  Do not use heating pads or hot water bottles on your feet. They may burn your skin. If you have lost feeling in your feet or legs, you may not know this is happening until it is too late.  Protect your feet from hot and cold by wearing shoes, such as at the beach or on hot pavement.  Schedule a complete foot exam at least once a year (annually) or more often if you have foot problems. If you have foot problems, report any cuts, sores, or bruises to your health care provider immediately. Contact a health care provider if:  You have a medical condition that increases your risk of infection and you have any cuts, sores, or bruises on your feet.  You  have an injury that is not healing.  You have redness on your legs or feet.  You feel burning or tingling in your legs or feet.  You have pain or cramps in your legs and feet.  Your legs or feet are numb.  Your feet always feel cold.  You have pain around a toenail. Get help right away if:  You have a wound, scrape, corn, or callus on your foot and: ? You have pain, swelling, or redness that gets worse. ? You have fluid or blood coming from the wound, scrape, corn, or callus. ? Your wound, scrape, corn, or callus feels warm to the touch. ? You have pus or a bad smell coming from the wound, scrape, corn, or callus. ? You have a fever. ? You have a red  line going up your leg. Summary  Check your feet every day for cuts, sores, red spots, swelling, and blisters.  Moisturize feet and legs daily.  Wear shoes that fit properly and have enough cushioning.  If you have foot problems, report any cuts, sores, or bruises to your health care provider immediately.  Schedule a complete foot exam at least once a year (annually) or more often if you have foot problems. This information is not intended to replace advice given to you by your health care provider. Make sure you discuss any questions you have with your health care provider. Document Revised: 08/31/2019 Document Reviewed: 01/09/2017 Elsevier Patient Education  2020 Elsevier Inc.   Flat Feet, Adult  Normally, a foot has a curve, called an arch, on its inner side. The arch creates a gap between the foot and the ground. Flat feet is a common condition in which one or both feet do not have an arch. What are the causes? This condition may be caused by:  Failure of a normal arch to develop during childhood.  An injury to tendons and ligaments in the foot, such as to the tendon that supports the arch (posterior tibial tendon).  Loose tendons or ligaments in the foot.  A wearing down of the arch over time.  Injury to bones in the foot.  An abnormality in the bones of the foot, called tarsal coalition. This happens when two or more bones in the foot are joined together (fused) before birth. What increases the risk? This condition is more likely to develop in:  Females.  Adults age 59 or older.  People who: ? Have a family history of flat feet. ? Have a history of childhood flexible flatfoot. ? Are obese. ? Have diabetes. ? Have high blood pressure. ? Participate in high-impact sports. ? Have inflammatory arthritis. ? Have a history of broken (fractured) or dislocated bones in the foot. What are the signs or symptoms? Symptoms of this condition include:  Pain or tightness  along the bottom of the foot.  Foot pain that gets worse with activity.  Swelling of the inner side of the foot.  Swelling of the ankle.  Pain on the outer side of the ankle.  Changes in the way that you walk (gait).  Pronation. This is when the foot and ankle lean inward when you are standing.  Bony bumps on the top or inner side of the foot. How is this diagnosed? This condition is diagnosed with a physical exam of your foot and ankle. Your health care provider may also:  Look at your shoes for patterns of wear on the soles.  Order imaging tests, such as X-rays, a  CT scan, or an MRI.  Refer you to a health care provider who specializes in feet (podiatrist) or a physical therapist. How is this treated? This condition may be treated with:  Stretching exercises or physical therapy. This helps to increase range of motion and relieve pain.  A shoe insert (orthotic). This helps to support the arch of your foot. Orthotics can be purchased from a store or can be custom-made by your health care provider.  Wearing shoes with appropriate arch support. This is especially important for athletes.  Medicines. These may be prescribed to relieve pain.  An ankle brace, boot, or cast. These may be used to relieve pressure on your foot. You may be given crutches if walking is painful.  Surgery. This may be done to improve the alignment of your foot. This is only needed if your posterior tibial tendon is torn or if you have tarsal coalition. Follow these instructions at home: Activity  Do any exercises as told by your health care provider.  If an activity causes pain, avoid it or try to find another activity that does not cause pain. General instructions  Wear orthotics and appropriate shoes as told by your health care provider.  Take over-the-counter and prescription medicines only as told by your health care provider.  Wear an ankle brace, boot, or cast as told by your health care  provider.  Use crutches as told by your health care provider.  Keep all follow-up visits as told by your health care provider. This is important. How is this prevented? To prevent the condition from getting worse:  Wear comfortable, supportive shoes that are appropriate for your activities.  Maintain a healthy weight.  Stay active in a way that your health care provider recommends. This will help to keep your feet flexible and strong.  Manage long-term (chronic) health conditions, such as diabetes, high blood pressure, and inflammatory arthritis.  Work with a health care provider if you have concerns about your feet or shoes. Contact a health care provider if:  You have pain in your foot or lower leg that gets worse or does not improve with medicine.  You have pain or difficulty when walking.  You have problems with your orthotics. Summary  Flat feet is a common condition in which one or both feet do not have a curve, called an arch, on the inner side.  Your health care provider may recommend a shoe insert (orthotic) or shoes with the appropriate arch support.  Other treatments may include stretching exercises or physical therapy, medicines to relieve pain, and wearing an ankle brace, boot, or cast.  Surgery may be done if you have a tear in the tendon that supports your arch (posterior tibial tendon) or if two or more of your foot bones were joined together (fused)  before birth (tarsal coalition). This information is not intended to replace advice given to you by your health care provider. Make sure you discuss any questions you have with your health care provider. Document Revised: 03/31/2019 Document Reviewed: 02/18/2017 Elsevier Patient Education  2020 Elsevier Inc.   Onychomycosis/Fungal Toenails  WHAT IS IT? An infection that lies within the keratin of your nail plate that is caused by a fungus.  WHY ME? Fungal infections affect all ages, sexes, races, and creeds.   There may be many factors that predispose you to a fungal infection such as age, coexisting medical conditions such as diabetes, or an autoimmune disease; stress, medications, fatigue, genetics, etc.  Bottom line:  fungus thrives in a warm, moist environment and your shoes offer such a location.  IS IT CONTAGIOUS? Theoretically, yes.  You do not want to share shoes, nail clippers or files with someone who has fungal toenails.  Walking around barefoot in the same room or sleeping in the same bed is unlikely to transfer the organism.  It is important to realize, however, that fungus can spread easily from one nail to the next on the same foot.  HOW DO WE TREAT THIS?  There are several ways to treat this condition.  Treatment may depend on many factors such as age, medications, pregnancy, liver and kidney conditions, etc.  It is best to ask your doctor which options are available to you.  11. No treatment.   Unlike many other medical concerns, you can live with this condition.  However for many people this can be a painful condition and may lead to ingrown toenails or a bacterial infection.  It is recommended that you keep the nails cut short to help reduce the amount of fungal nail. 12. Topical treatment.  These range from herbal remedies to prescription strength nail lacquers.  About 40-50% effective, topicals require twice daily application for approximately 9 to 12 months or until an entirely new nail has grown out.  The most effective topicals are medical grade medications available through physicians offices. 13. Oral antifungal medications.  With an 80-90% cure rate, the most common oral medication requires 3 to 4 months of therapy and stays in your system for a year as the new nail grows out.  Oral antifungal medications do require blood work to make sure it is a safe drug for you.  A liver function panel will be performed prior to starting the medication and after the first month of treatment.  It is  important to have the blood work performed to avoid any harmful side effects.  In general, this medication safe but blood work is required. 14. Laser Therapy.  This treatment is performed by applying a specialized laser to the affected nail plate.  This therapy is noninvasive, fast, and non-painful.  It is not covered by insurance and is therefore, out of pocket.  The results have been very good with a 80-95% cure rate.  [facility] is the only practice in the area to offer this therapy. 15. Permanent Nail Avulsion.  Removing the entire nail so that a new nail will not grow back.

## 2020-05-23 NOTE — Progress Notes (Signed)
Subjective: Bryan Wells presents today for for annual diabetic foot examination and painful mycotic nails b/l that are difficult to trim. Pain interferes with ambulation. Aggravating factors include wearing enclosed shoe gear. Pain is relieved with periodic professional debridement.  He voices no new pedal concerns on today's visit.  He is excited he has started the [employer] and has lost 20 pounds. He is committed to continuing this plan because he is seeing results and enjoys the food choices.  Past Medical History:  Diagnosis Date  . AAA (abdominal aortic aneurysm) ([place])    followed by vascular -- dr Denzil Magnuson-- infrarenal AAA and proximal descending thoracic   . Bell's palsy 05/2017   left side-- per pt residual mild stiffness  . History of aortic dissection 09/2008   type B  s/p  stenting BMS's distal descending thoracic into bilateral iliac arteries  . History of MI (myocardial infarction)    per vascular note in epic,  post op complicattion aortic/ iliac stenting MI with tracheostomy for prolonged vent.  . Hyperlipidemia    hypercholesterolemia  . Hypertension   . Morbid obesity with BMI of 50.0-59.9, adult ([place])   . OSA on CPAP   . Type 2 diabetes mellitus treated with insulin ([place])    followed by pcp  . Wears dentures    upper  . Wears glasses     Patient Active Problem List   Diagnosis Date Noted  . History of Bell's palsy 02/28/2020  . Sudden left hearing loss 02/28/2020  . Tinnitus of left ear 02/28/2020  . Noncompliance with diabetes treatment 10/02/2018  . Bell's palsy 08/21/2017  . Hyperlipidemia 08/21/2017  . Morbid obesity with BMI of 50.0-59.9, adult ([place]) 08/21/2017  . Sleep apnea 08/21/2017  . Type 2 diabetes mellitus with hyperglycemia ([place]) 08/21/2017  . SINUSITIS, CHRONIC 12/11/2009  . ERECTILE DYSFUNCTION, ORGANIC 12/11/2009  . MORBID OBESITY 12/10/2009  . DEPRESSION 12/10/2009  . ULNAR NERVE ENTRAPMENT 12/10/2009  . AORTIC ANEURYSM 12/10/2009   . HYPERTENSION NEC 12/10/2009  . History of AAA (abdominal aortic aneurysm) repair 12/10/2009  . Hypertension 12/10/2009    Past Surgical History:  Procedure Laterality Date  . COLONOSCOPY WITH PROPOFOL N/A 02/22/2019   Procedure: COLONOSCOPY WITH PROPOFOL;  Surgeon: Wonda Horner, MD;  Location: [facility];  Service: Endoscopy;  Laterality: N/A;  . DESCENDING AORTIC ANEURYSM REPAIR W/ STENT  10/ 2009  in [place]   BMS x2 distal portion extending into bilateral common iliac arteries  . ESOPHAGOGASTRODUODENOSCOPY (EGD) WITH PROPOFOL N/A 02/22/2019   Procedure: ESOPHAGOGASTRODUODENOSCOPY (EGD) WITH PROPOFOL;  Surgeon: Wonda Horner, MD;  Location: [facility];  Service: Endoscopy;  Laterality: N/A;    Current Outpatient Medications on File Prior to Visit  Medication Sig Dispense Refill  . aspirin EC 81 MG tablet Take 81 mg by mouth daily.    Marland Kitchen atorvastatin (LIPITOR) 10 MG tablet Take 10 mg by mouth daily.    . ciclopirox (LOPROX) 0.77 % cream Apply to both feet and between toes twice daily for 4 weeks. (Patient taking differently: Apply 1 application topically daily as needed (affected area(s) both feet and between toes). ) 30 g 1  . clotrimazole-betamethasone (LOTRISONE) cream Apply 1 application topically 2 (two) times daily as needed (for skin fold/skin irritation.).   0  . diclofenac Sodium (VOLTAREN) 1 % GEL     . diltiazem (CARDIZEM CD) 240 MG 24 hr capsule Take 240 mg by mouth at bedtime.     Marland Kitchen diltiazem (TIAZAC) 240 MG  24 hr capsule     . furosemide (LASIX) 40 MG tablet Take 40 mg by mouth daily.     Marland Kitchen ibuprofen (ADVIL,MOTRIN) 200 MG tablet Take 600 mg by mouth every 8 (eight) hours as needed for mild pain (pain.).     Marland Kitchen labetalol (NORMODYNE) 300 MG tablet Take 300 mg by mouth at bedtime.   3  . meloxicam (MOBIC) 15 MG tablet TK 1 T PO QD WITH FOOD FOR 2 WEEK THEN PRN    . metFORMIN (GLUCOPHAGE) 1000 MG tablet Take 500 mg by mouth 2 (two) times daily.   1  . omeprazole  (PRILOSEC) 20 MG capsule Take 20 mg by mouth daily before breakfast.     . pantoprazole (PROTONIX) 40 MG tablet     . TRESIBA FLEXTOUCH 200 UNIT/ML SOPN Inject 70 Units into the skin at bedtime.      No current facility-administered medications on file prior to visit.     Allergies  Allergen Reactions  . Lisinopril Swelling  . Penicillins Other (See Comments)    Loss of consciousness  Has patient had a PCN reaction causing immediate rash, facial/tongue/throat swelling, SOB or lightheadedness with hypotension: Yes Has patient had a PCN reaction causing severe rash involving mucus membranes or skin necrosis: No Has patient had a PCN reaction that required hospitalization: Unknown Has patient had a PCN reaction occurring within the last 10 years: No If all of the above answers are "NO", then may proceed with Cephalosporin use.     Social History   Occupational History  . Not on file  Tobacco Use  . Smoking status: Former Smoker    Years: 20.00    Types: Cigarettes    Quit date: 02/21/2012    Years since quitting: 8.2  . Smokeless tobacco: Never Used  Substance and Sexual Activity  . Alcohol use: No  . Drug use: Never  . Sexual activity: Not on file    Family History  Problem Relation Age of Onset  . Diabetes Father     Immunization History  Administered Date(s) Administered  . Influenza Whole 12/11/2009  . Influenza,inj,quad, With Preservative 09/30/2017  . Pneumococcal Polysaccharide-23 12/11/2009  . Td 12/11/2009     Objective: There were no vitals filed for this visit.  Bryan Wells is a/an 59 y.o. male  in NAD. AAO X 3.  Vascular Examination: Neurovascular status unchanged b/l lower extremities. Capillary fill time to digits <3 seconds b/l lower extremities. Palpable DP pulses b/l. Palpable PT pulses b/l. Pedal hair absent b/l Skin temperature gradient within normal limits b/l. No pain with calf compression b/l.  Dermatological Examination: Pedal skin with  normal turgor, texture and tone bilaterally. No open wounds bilaterally. No interdigital macerations bilaterally. Toenails 1-5 b/l elongated, discolored, dystrophic, thickened, crumbly with subungual debris and tenderness to dorsal palpation.  Musculoskeletal Examination: Normal muscle strength 5/5 to all lower extremity muscle groups bilaterally. No pain crepitus or joint limitation noted with ROM b/l. Pes planus deformity noted b/l.   Neurological Examination: Protective sensation intact 5/5 intact bilaterally with 10g monofilament b/l. Vibratory sensation intact b/l.  Assessment: 1. Pain due to onychomycosis of toenails of both feet   2. Pes planus of both feet   3. Type 2 diabetes mellitus with hyperglycemia, without long-term current use of insulin (HCC)   4. Encounter for diabetic foot exam ([facility])    Plan: -Examined patient. No LOPS, but has foot deformity. Low risk, ADA, Category 1,  at present with foot deformity  of pes planus b/l. -Diabetic foot examination performed on today's visit. -Continue diabetic foot care principles. Literature dispensed on today.  -Discussed topical, laser and oral medication. Patient opted for topical treatment with compounded medication. Rx written for nonformulary compounding topical antifungal: [facility]: Antifungal cream - Terbinafine 3%, Fluconazole 2%, Tea Tree Oil 5%, Urea 10%, Ibuprofen 2% in DMSO Suspension #74ml. Apply to the affected nail(s) at bedtime. -Toenails 1-5 b/l were debrided in length and girth with sterile nail nippers and dremel without iatrogenic bleeding.  -Patient to continue soft, supportive shoe gear daily. -Patient to report any pedal injuries to medical professional immediately. -Patient/POA to call should there be question/concern in the interim.  Return in about 3 months (around 08/16/2020) for diabetic nail trim.

## 2020-08-15 ENCOUNTER — Ambulatory Visit: Payer: Medicare HMO | Admitting: Podiatry

## 2020-08-24 DIAGNOSIS — K219 Gastro-esophageal reflux disease without esophagitis: Secondary | ICD-10-CM | POA: Diagnosis not present

## 2020-08-24 DIAGNOSIS — M2550 Pain in unspecified joint: Secondary | ICD-10-CM | POA: Diagnosis not present

## 2020-08-24 DIAGNOSIS — I7101 Dissection of thoracic aorta: Secondary | ICD-10-CM | POA: Diagnosis not present

## 2020-08-24 DIAGNOSIS — I739 Peripheral vascular disease, unspecified: Secondary | ICD-10-CM | POA: Diagnosis not present

## 2020-08-24 DIAGNOSIS — G4733 Obstructive sleep apnea (adult) (pediatric): Secondary | ICD-10-CM | POA: Diagnosis not present

## 2020-08-24 DIAGNOSIS — E1151 Type 2 diabetes mellitus with diabetic peripheral angiopathy without gangrene: Secondary | ICD-10-CM | POA: Diagnosis not present

## 2020-08-24 DIAGNOSIS — I10 Essential (primary) hypertension: Secondary | ICD-10-CM | POA: Diagnosis not present

## 2020-08-24 DIAGNOSIS — Z794 Long term (current) use of insulin: Secondary | ICD-10-CM | POA: Diagnosis not present

## 2020-09-01 ENCOUNTER — Other Ambulatory Visit: Payer: Self-pay

## 2020-09-01 DIAGNOSIS — I714 Abdominal aortic aneurysm, without rupture, unspecified: Secondary | ICD-10-CM

## 2020-09-19 ENCOUNTER — Ambulatory Visit: Payer: Medicare HMO | Admitting: Podiatry

## 2020-09-25 ENCOUNTER — Other Ambulatory Visit: Payer: Self-pay

## 2020-09-25 ENCOUNTER — Encounter: Payer: Self-pay | Admitting: Podiatry

## 2020-09-25 ENCOUNTER — Ambulatory Visit (INDEPENDENT_AMBULATORY_CARE_PROVIDER_SITE_OTHER): Payer: Medicare HMO | Admitting: Podiatry

## 2020-09-25 DIAGNOSIS — M79675 Pain in left toe(s): Secondary | ICD-10-CM | POA: Diagnosis not present

## 2020-09-25 DIAGNOSIS — M79676 Pain in unspecified toe(s): Secondary | ICD-10-CM | POA: Diagnosis not present

## 2020-09-25 DIAGNOSIS — B351 Tinea unguium: Secondary | ICD-10-CM | POA: Diagnosis not present

## 2020-09-25 DIAGNOSIS — M79674 Pain in right toe(s): Secondary | ICD-10-CM | POA: Diagnosis not present

## 2020-09-25 DIAGNOSIS — E1165 Type 2 diabetes mellitus with hyperglycemia: Secondary | ICD-10-CM | POA: Diagnosis not present

## 2020-09-25 DIAGNOSIS — M2141 Flat foot [pes planus] (acquired), right foot: Secondary | ICD-10-CM

## 2020-09-25 DIAGNOSIS — M2142 Flat foot [pes planus] (acquired), left foot: Secondary | ICD-10-CM

## 2020-09-25 MED ORDER — MUPIROCIN 2 % EX OINT: TOPICAL_OINTMENT | CUTANEOUS | 0 refills | Status: DC

## 2020-09-29 NOTE — Progress Notes (Signed)
Subjective: Bryan Wells presents today for preventative diabetic foot care and painful thick toenails that are difficult to trim. Pain interferes with ambulation. Aggravating factors include wearing enclosed shoe gear. Pain is relieved with periodic professional debridement.Marland Kitchen  He voices no new pedal concerns on today's visit.  He is happy to report he is still using the [employer] plan as well as [employer] now. He also joined [employer] and goes twice weekly.   He states his blood sugar was 139 mg/dl this morning. His PCP is Dr. Georgann Housekeeper at [facility] and his last visit was last month.  Past Medical History:  Diagnosis Date  . AAA (abdominal aortic aneurysm) (HCC)    followed by vascular -- dr Jenne Pane-- infrarenal AAA and proximal descending thoracic   . Bell's palsy 05/2017   left side-- per pt residual mild stiffness  . History of aortic dissection 09/2008   type B  s/p  stenting BMS's distal descending thoracic into bilateral iliac arteries  . History of MI (myocardial infarction)    per vascular note in epic,  post op complicattion aortic/ iliac stenting MI with tracheostomy for prolonged vent.  . Hyperlipidemia    hypercholesterolemia  . Hypertension   . Morbid obesity with BMI of 50.0-59.9, adult (HCC)   . OSA on CPAP   . Type 2 diabetes mellitus treated with insulin (HCC)    followed by pcp  . Wears dentures    upper  . Wears glasses     Patient Active Problem List   Diagnosis Date Noted  . History of Bell's palsy 02/28/2020  . Sudden left hearing loss 02/28/2020  . Tinnitus of left ear 02/28/2020  . Noncompliance with diabetes treatment 10/02/2018  . Bell's palsy 08/21/2017  . Hyperlipidemia 08/21/2017  . Morbid obesity with BMI of 50.0-59.9, adult (HCC) 08/21/2017  . Sleep apnea 08/21/2017  . Type 2 diabetes mellitus with hyperglycemia (HCC) 08/21/2017  . SINUSITIS, CHRONIC 12/11/2009  . ERECTILE DYSFUNCTION, ORGANIC 12/11/2009  . MORBID OBESITY  12/10/2009  . DEPRESSION 12/10/2009  . ULNAR NERVE ENTRAPMENT 12/10/2009  . AORTIC ANEURYSM 12/10/2009  . HYPERTENSION NEC 12/10/2009  . History of AAA (abdominal aortic aneurysm) repair 12/10/2009  . Hypertension 12/10/2009    Past Surgical History:  Procedure Laterality Date  . COLONOSCOPY WITH PROPOFOL N/A 02/22/2019   Procedure: COLONOSCOPY WITH PROPOFOL;  Surgeon: Graylin Shiver, MD;  Location: [facility];  Service: Endoscopy;  Laterality: N/A;  . DESCENDING AORTIC ANEURYSM REPAIR W/ STENT  10/ 2009  in [place]   BMS x2 distal portion extending into bilateral common iliac arteries  . ESOPHAGOGASTRODUODENOSCOPY (EGD) WITH PROPOFOL N/A 02/22/2019   Procedure: ESOPHAGOGASTRODUODENOSCOPY (EGD) WITH PROPOFOL;  Surgeon: Graylin Shiver, MD;  Location: [facility];  Service: Endoscopy;  Laterality: N/A;    Current Outpatient Medications on File Prior to Visit  Medication Sig Dispense Refill  . aspirin EC 81 MG tablet Take 81 mg by mouth daily.    Marland Kitchen atorvastatin (LIPITOR) 10 MG tablet Take 10 mg by mouth daily.    . ciclopirox (LOPROX) 0.77 % cream Apply to both feet and between toes twice daily for 4 weeks. (Patient taking differently: Apply 1 application topically daily as needed (affected area(s) both feet and between toes). ) 30 g 1  . clotrimazole-betamethasone (LOTRISONE) cream Apply 1 application topically 2 (two) times daily as needed (for skin fold/skin irritation.).   0  . diclofenac Sodium (VOLTAREN) 1 % GEL     . diltiazem (CARDIZEM CD)  240 MG 24 hr capsule Take 240 mg by mouth at bedtime.     Marland Kitchen diltiazem (TIAZAC) 240 MG 24 hr capsule     . furosemide (LASIX) 40 MG tablet Take 40 mg by mouth daily.     Marland Kitchen ibuprofen (ADVIL,MOTRIN) 200 MG tablet Take 600 mg by mouth every 8 (eight) hours as needed for mild pain (pain.).     Marland Kitchen labetalol (NORMODYNE) 300 MG tablet Take 300 mg by mouth at bedtime.   3  . meloxicam (MOBIC) 15 MG tablet TK 1 T PO QD WITH FOOD FOR 2 WEEK THEN PRN    .  metFORMIN (GLUCOPHAGE) 1000 MG tablet Take 500 mg by mouth 2 (two) times daily.   1  . NONFORMULARY OR COMPOUNDED ITEM Antifungal solution: Terbinafine 3%, Fluconazole 2%, Tea Tree Oil 5%, Urea 10%, Ibuprofen 2% in DMSO suspension #87mL 1 each 3  . omeprazole (PRILOSEC) 20 MG capsule Take 20 mg by mouth daily before breakfast.     . pantoprazole (PROTONIX) 40 MG tablet     . TRESIBA FLEXTOUCH 200 UNIT/ML SOPN Inject 70 Units into the skin at bedtime.     Marland Kitchen acetaminophen-codeine (TYLENOL #3) 300-30 MG tablet      No current facility-administered medications on file prior to visit.     Allergies  Allergen Reactions  . Lisinopril Swelling  . Penicillins Other (See Comments)    Loss of consciousness  Has patient had a PCN reaction causing immediate rash, facial/tongue/throat swelling, SOB or lightheadedness with hypotension: Yes Has patient had a PCN reaction causing severe rash involving mucus membranes or skin necrosis: No Has patient had a PCN reaction that required hospitalization: Unknown Has patient had a PCN reaction occurring within the last 10 years: No If all of the above answers are "NO", then may proceed with Cephalosporin use.     Social History   Occupational History  . Not on file  Tobacco Use  . Smoking status: Former Smoker    Years: 20.00    Types: Cigarettes    Quit date: 02/21/2012    Years since quitting: 8.6  . Smokeless tobacco: Never Used  Vaping Use  . Vaping Use: Never used  Substance and Sexual Activity  . Alcohol use: No  . Drug use: Never  . Sexual activity: Not on file    Family History  Problem Relation Age of Onset  . Diabetes Father     Immunization History  Administered Date(s) Administered  . Influenza Whole 12/11/2009  . Influenza,inj,quad, With Preservative 09/30/2017  . Pneumococcal Polysaccharide-23 12/11/2009  . Td 12/11/2009     Objective: There were no vitals filed for this visit.  Bryan Wells is a pleasant 59 y.o. African  American male, morbidly obese in NAD. AAO X 3.  Vascular Examination: Neurovascular status unchanged b/l lower extremities. Capillary fill time to digits <3 seconds b/l lower extremities. Palpable DP pulses b/l. Palpable PT pulses b/l. Pedal hair absent b/l Skin temperature gradient within normal limits b/l. No pain with calf compression b/l.  Dermatological Examination: Pedal skin with normal turgor, texture and tone bilaterally. No open wounds bilaterally. No interdigital macerations bilaterally. Toenails 1-5 b/l elongated, discolored, dystrophic, thickened, crumbly with subungual debris and tenderness to dorsal palpation.  Musculoskeletal Examination: Normal muscle strength 5/5 to all lower extremity muscle groups bilaterally. No pain crepitus or joint limitation noted with ROM b/l. Pes planus deformity noted b/l.   Neurological Examination: Protective sensation intact 5/5 intact bilaterally with 10g monofilament b/l.  Vibratory sensation intact b/l.  Assessment: 1. Pain due to onychomycosis of toenails of both feet   2. Pes planus of both feet   3. Type 2 diabetes mellitus with hyperglycemia, without long-term current use of insulin (HCC)    Plan: -Continue diabetic foot care principles. Literature dispensed on today.  -Toenails 1-5 b/l were debrided in length and girth with sterile nail nippers and dremel. Iatrogenic superfiical abrasion sustained during nail smooth of left hallux by M.A. I assessed digit, applied Lumicain Hemostatic Solution, triple antibiotic ointment and band-aid. I prescribed a tube of Mupirocin Ointment and he is to apply once daily. He will follow up in one week and I will check his left great toe. Patient agrees with treatment plan. -Patient to continue soft, supportive shoe gear daily. -Patient to report any pedal injuries to medical professional immediately. -Patient/POA to call should there be question/concern in the interim.  Return in about 1 week (around  10/02/2020).

## 2020-10-02 ENCOUNTER — Ambulatory Visit: Payer: Medicare HMO | Admitting: Podiatry

## 2020-10-12 ENCOUNTER — Other Ambulatory Visit: Payer: Self-pay | Admitting: *Deleted

## 2020-10-12 DIAGNOSIS — I714 Abdominal aortic aneurysm, without rupture, unspecified: Secondary | ICD-10-CM

## 2020-10-29 ENCOUNTER — Inpatient Hospital Stay: Admission: RE | Admit: 2020-10-29 | Payer: Medicare HMO | Source: Ambulatory Visit

## 2020-10-29 ENCOUNTER — Other Ambulatory Visit: Payer: Medicare HMO

## 2020-11-05 ENCOUNTER — Ambulatory Visit: Payer: Medicare HMO | Admitting: Surgery

## 2020-11-06 DIAGNOSIS — Z23 Encounter for immunization: Secondary | ICD-10-CM | POA: Diagnosis not present

## 2020-11-06 DIAGNOSIS — E1165 Type 2 diabetes mellitus with hyperglycemia: Secondary | ICD-10-CM | POA: Diagnosis not present

## 2020-11-06 DIAGNOSIS — Z794 Long term (current) use of insulin: Secondary | ICD-10-CM | POA: Diagnosis not present

## 2020-11-06 DIAGNOSIS — M62838 Other muscle spasm: Secondary | ICD-10-CM | POA: Diagnosis not present

## 2020-11-27 DIAGNOSIS — G4733 Obstructive sleep apnea (adult) (pediatric): Secondary | ICD-10-CM | POA: Diagnosis not present

## 2020-12-31 ENCOUNTER — Ambulatory Visit: Payer: Medicare HMO | Admitting: Surgery

## 2021-01-25 ENCOUNTER — Other Ambulatory Visit: Payer: Self-pay | Admitting: Podiatry

## 2021-01-28 NOTE — Telephone Encounter (Signed)
Please advise 

## 2021-01-29 ENCOUNTER — Other Ambulatory Visit: Payer: Self-pay | Admitting: Internal Medicine

## 2021-01-29 DIAGNOSIS — I712 Thoracic aortic aneurysm, without rupture, unspecified: Secondary | ICD-10-CM

## 2021-01-29 DIAGNOSIS — E7800 Pure hypercholesterolemia, unspecified: Secondary | ICD-10-CM | POA: Diagnosis not present

## 2021-01-29 DIAGNOSIS — Z6841 Body Mass Index (BMI) 40.0 and over, adult: Secondary | ICD-10-CM | POA: Diagnosis not present

## 2021-01-29 DIAGNOSIS — Z125 Encounter for screening for malignant neoplasm of prostate: Secondary | ICD-10-CM | POA: Diagnosis not present

## 2021-01-29 DIAGNOSIS — I10 Essential (primary) hypertension: Secondary | ICD-10-CM | POA: Diagnosis not present

## 2021-01-29 DIAGNOSIS — Z23 Encounter for immunization: Secondary | ICD-10-CM | POA: Diagnosis not present

## 2021-01-29 DIAGNOSIS — E1165 Type 2 diabetes mellitus with hyperglycemia: Secondary | ICD-10-CM | POA: Diagnosis not present

## 2021-01-29 DIAGNOSIS — Z794 Long term (current) use of insulin: Secondary | ICD-10-CM | POA: Diagnosis not present

## 2021-01-29 DIAGNOSIS — Z1389 Encounter for screening for other disorder: Secondary | ICD-10-CM | POA: Diagnosis not present

## 2021-01-29 DIAGNOSIS — E1151 Type 2 diabetes mellitus with diabetic peripheral angiopathy without gangrene: Secondary | ICD-10-CM | POA: Diagnosis not present

## 2021-01-29 DIAGNOSIS — Z1159 Encounter for screening for other viral diseases: Secondary | ICD-10-CM | POA: Diagnosis not present

## 2021-01-29 DIAGNOSIS — Z0000 Encounter for general adult medical examination without abnormal findings: Secondary | ICD-10-CM | POA: Diagnosis not present

## 2021-01-29 DIAGNOSIS — M179 Osteoarthritis of knee, unspecified: Secondary | ICD-10-CM | POA: Diagnosis not present

## 2021-01-29 DIAGNOSIS — G4733 Obstructive sleep apnea (adult) (pediatric): Secondary | ICD-10-CM | POA: Diagnosis not present

## 2021-01-29 DIAGNOSIS — E119 Type 2 diabetes mellitus without complications: Secondary | ICD-10-CM | POA: Diagnosis not present

## 2021-02-15 ENCOUNTER — Inpatient Hospital Stay: Admission: RE | Admit: 2021-02-15 | Payer: Medicare HMO | Source: Ambulatory Visit

## 2021-03-18 ENCOUNTER — Ambulatory Visit: Payer: Medicare HMO | Admitting: Surgery

## 2021-04-08 ENCOUNTER — Ambulatory Visit: Payer: Medicare HMO | Admitting: Surgery

## 2021-05-01 ENCOUNTER — Ambulatory Visit
Admission: RE | Admit: 2021-05-01 | Discharge: 2021-05-01 | Disposition: A | Discharge status: Home / Self Care | Payer: Medicare HMO | Source: Ambulatory Visit | Attending: Internal Medicine | Admitting: Internal Medicine

## 2021-05-01 ENCOUNTER — Other Ambulatory Visit: Payer: Self-pay | Admitting: Internal Medicine

## 2021-05-01 DIAGNOSIS — H9312 Tinnitus, left ear: Secondary | ICD-10-CM | POA: Diagnosis not present

## 2021-05-01 DIAGNOSIS — M25551 Pain in right hip: Secondary | ICD-10-CM | POA: Diagnosis not present

## 2021-05-01 DIAGNOSIS — M25552 Pain in left hip: Secondary | ICD-10-CM

## 2021-05-01 DIAGNOSIS — M25751 Osteophyte, right hip: Secondary | ICD-10-CM | POA: Diagnosis not present

## 2021-05-01 DIAGNOSIS — M160 Bilateral primary osteoarthritis of hip: Secondary | ICD-10-CM | POA: Diagnosis not present

## 2021-05-01 DIAGNOSIS — H9319 Tinnitus, unspecified ear: Secondary | ICD-10-CM | POA: Diagnosis not present

## 2021-05-01 DIAGNOSIS — I10 Essential (primary) hypertension: Secondary | ICD-10-CM | POA: Diagnosis not present

## 2021-05-10 DIAGNOSIS — M545 Low back pain, unspecified: Secondary | ICD-10-CM | POA: Diagnosis not present

## 2021-05-28 IMAGING — CR DG HIP (WITH OR WITHOUT PELVIS) 3-4V BILAT
3 series · 3 of 3 positions shown · non-contrast
Comparison: CT 06/28/2014

CLINICAL DATA: Bilateral hip pain

EXAM:
DG HIP (WITH OR WITHOUT PELVIS) 3-4V BILAT

[t pelvis a.p.]
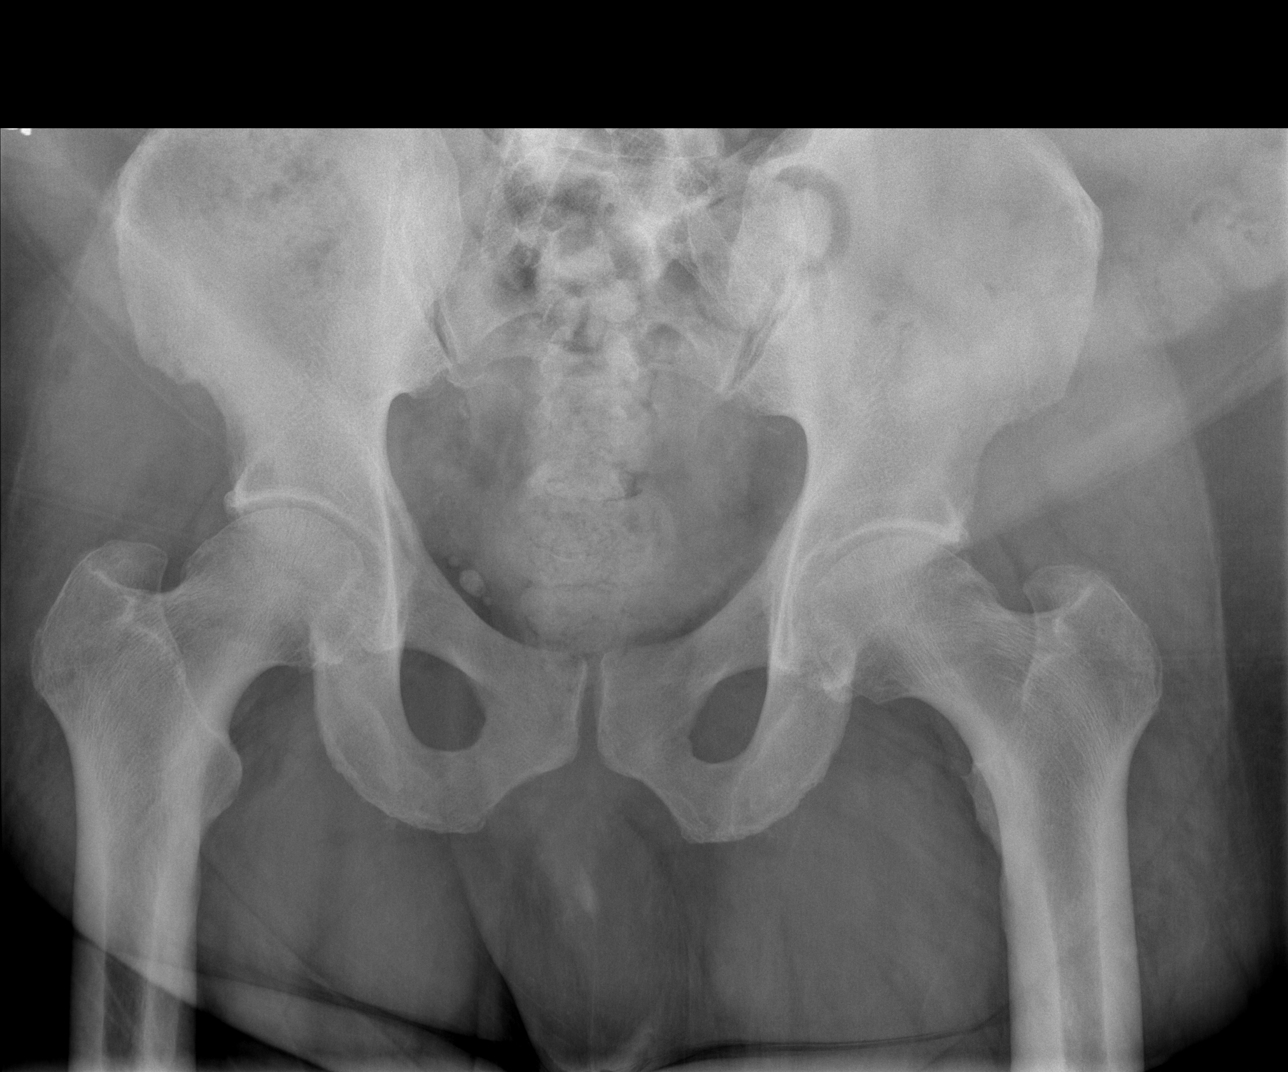

[t hip frog leg left]
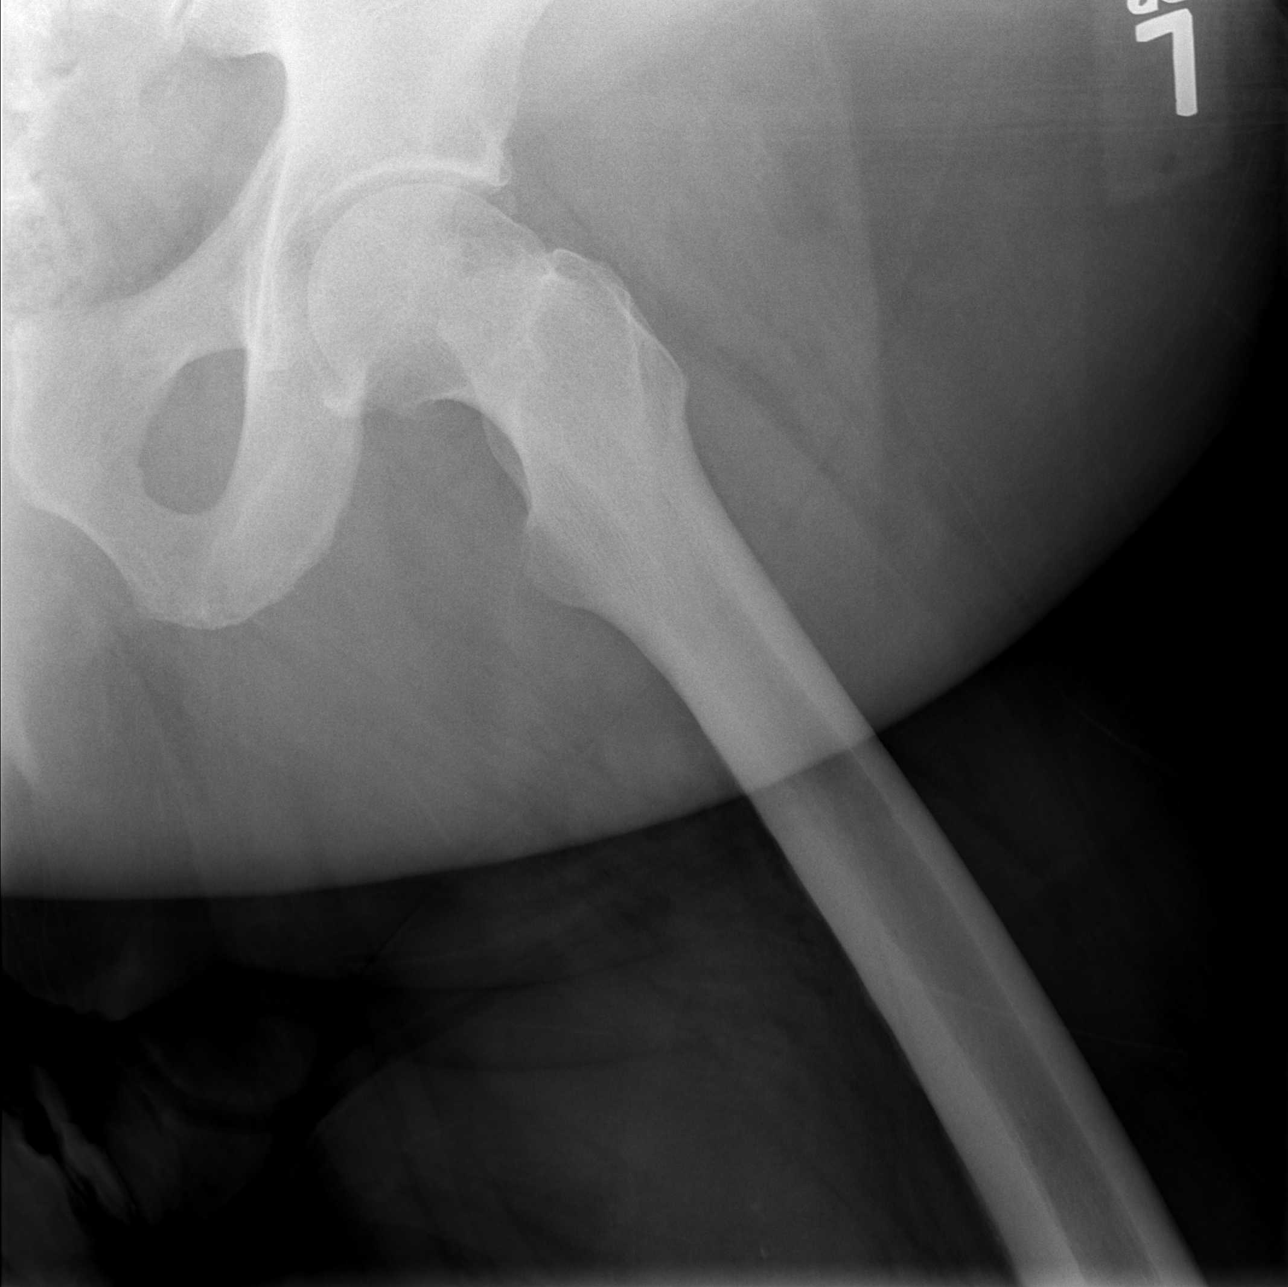

[t hip frog leg right]
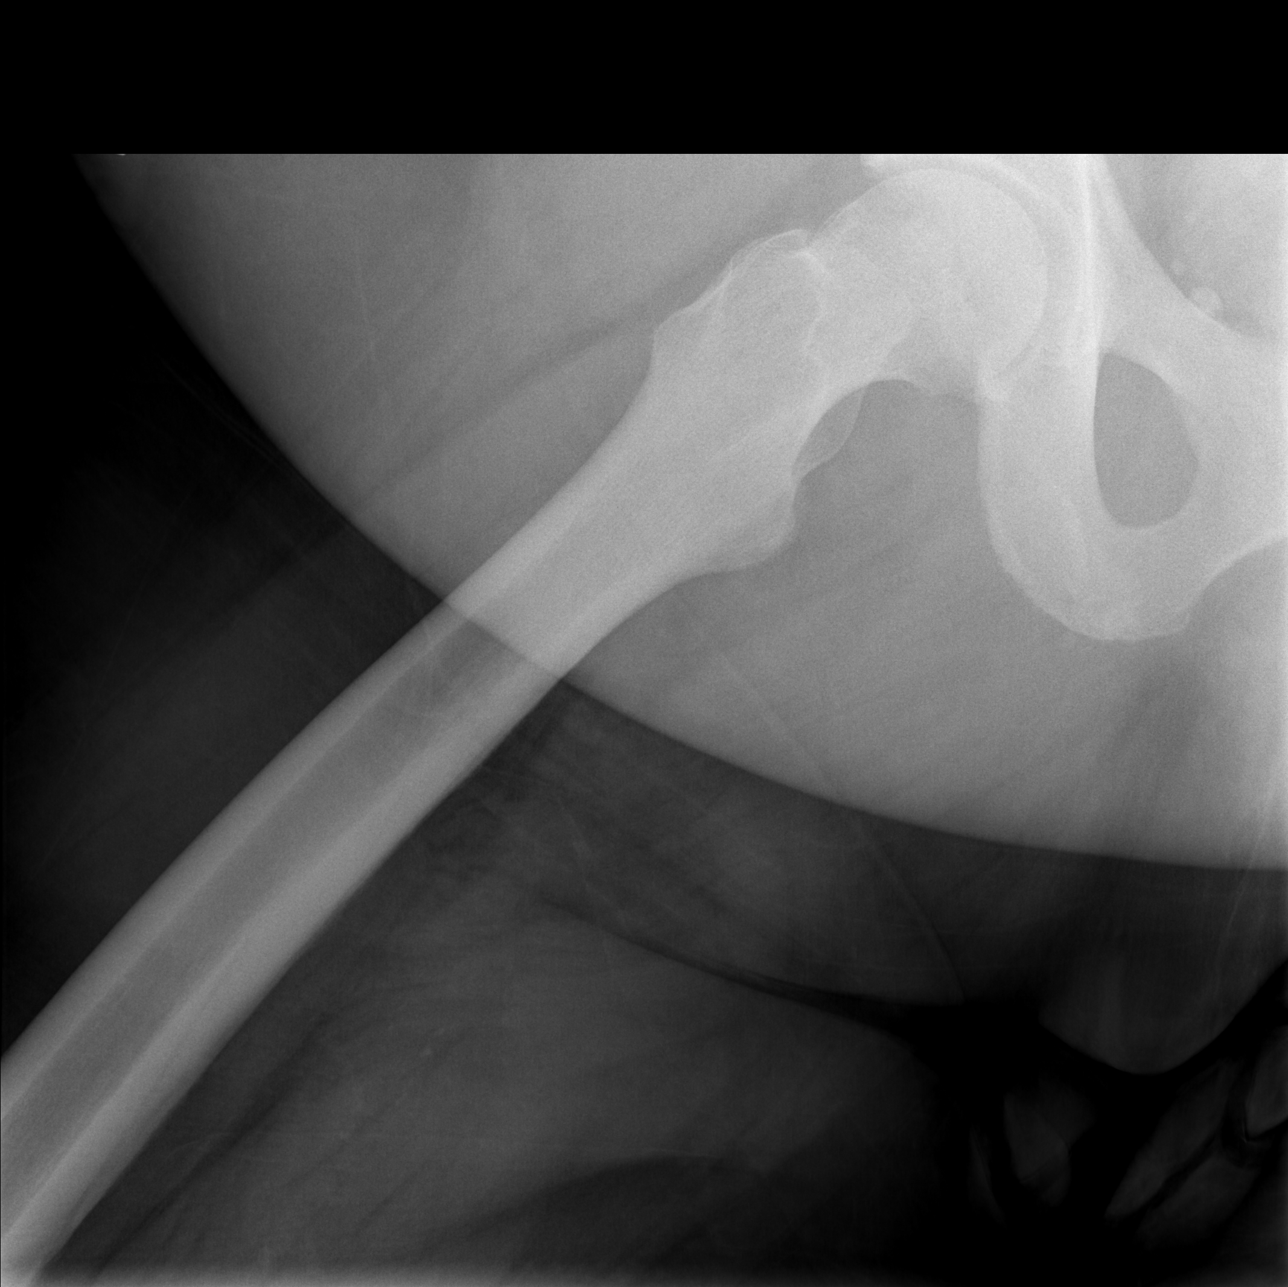

[3 of 3 positions shown; findings below may reference images not displayed]

FINDINGS: There is no evidence of fracture. There is decreased femoral head
neck offset bilaterally which can be seen with cam type impingement.
There is mild osteophyte formation of the hips bilaterally.
Bilateral iliac stents noted.
IMPRESSION: Mild bilateral hip degenerative change with decreased femoral head
neck offset bilaterally which can be seen with cam type impingement.

## 2021-05-29 DIAGNOSIS — H9312 Tinnitus, left ear: Secondary | ICD-10-CM | POA: Diagnosis not present

## 2021-05-29 DIAGNOSIS — H9042 Sensorineural hearing loss, unilateral, left ear, with unrestricted hearing on the contralateral side: Secondary | ICD-10-CM | POA: Diagnosis not present

## 2021-06-07 ENCOUNTER — Other Ambulatory Visit: Payer: Self-pay

## 2021-06-07 ENCOUNTER — Ambulatory Visit: Payer: Medicare HMO | Admitting: Podiatry

## 2021-06-07 DIAGNOSIS — B351 Tinea unguium: Secondary | ICD-10-CM

## 2021-06-07 DIAGNOSIS — L600 Ingrowing nail: Secondary | ICD-10-CM | POA: Diagnosis not present

## 2021-06-07 DIAGNOSIS — E1165 Type 2 diabetes mellitus with hyperglycemia: Secondary | ICD-10-CM

## 2021-06-07 DIAGNOSIS — M79674 Pain in right toe(s): Secondary | ICD-10-CM

## 2021-06-10 NOTE — Telephone Encounter (Signed)
Patient still has medication on hand.

## 2021-06-13 ENCOUNTER — Encounter: Payer: Self-pay | Admitting: Podiatry

## 2021-06-13 DIAGNOSIS — I7101 Dissection of thoracic aorta: Secondary | ICD-10-CM | POA: Insufficient documentation

## 2021-06-13 DIAGNOSIS — Z794 Long term (current) use of insulin: Secondary | ICD-10-CM | POA: Insufficient documentation

## 2021-06-13 DIAGNOSIS — M179 Osteoarthritis of knee, unspecified: Secondary | ICD-10-CM | POA: Insufficient documentation

## 2021-06-13 DIAGNOSIS — R1310 Dysphagia, unspecified: Secondary | ICD-10-CM | POA: Insufficient documentation

## 2021-06-13 DIAGNOSIS — K219 Gastro-esophageal reflux disease without esophagitis: Secondary | ICD-10-CM | POA: Insufficient documentation

## 2021-06-13 DIAGNOSIS — I71019 Dissection of thoracic aorta, unspecified: Secondary | ICD-10-CM | POA: Insufficient documentation

## 2021-06-13 DIAGNOSIS — I712 Thoracic aortic aneurysm, without rupture, unspecified: Secondary | ICD-10-CM | POA: Insufficient documentation

## 2021-06-13 DIAGNOSIS — E785 Hyperlipidemia, unspecified: Secondary | ICD-10-CM | POA: Insufficient documentation

## 2021-06-13 DIAGNOSIS — E1169 Type 2 diabetes mellitus with other specified complication: Secondary | ICD-10-CM | POA: Insufficient documentation

## 2021-06-13 DIAGNOSIS — E7800 Pure hypercholesterolemia, unspecified: Secondary | ICD-10-CM | POA: Insufficient documentation

## 2021-06-13 DIAGNOSIS — M1710 Unilateral primary osteoarthritis, unspecified knee: Secondary | ICD-10-CM | POA: Insufficient documentation

## 2021-06-13 NOTE — Progress Notes (Signed)
  Subjective:  Patient ID: Bryan Wells, male    DOB: 08/13/1961,  MRN: [id number]  60 y.o. male presents with preventative diabetic foot care and ingrown toenail to the lateral border of R hallux.    Patient's blood sugar was 132 mg/dl on yesterday. Patient did not check blood glucose this morning.  PCP: Georgann Housekeeper, MD and last visit was: 05/09/2021.  Patient states he had been trimming his own toenails since his last visit. However, he stubbed his toe and the toenail of the right hallux cracked. He feels the nail is ingrown. It is tender. He denies any drainage, redness or swelling.  Review of Systems: Negative except as noted in the HPI.   Allergies  Allergen Reactions   Ciprofloxacin     Other reaction(s): hives   Hydrochlorothiazide     Other reaction(s): Swelling   Ibuprofen     Other reaction(s): stomach upset   Lisinopril Swelling    Other reaction(s): angioedma   Penicillins Other (See Comments)    Loss of consciousness  Has patient had a PCN reaction causing immediate rash, facial/tongue/throat swelling, SOB or lightheadedness with hypotension: Yes Has patient had a PCN reaction causing severe rash involving mucus membranes or skin necrosis: No Has patient had a PCN reaction that required hospitalization: Unknown Has patient had a PCN reaction occurring within the last 10 years: No If all of the above answers are "NO", then may proceed with Cephalosporin use.     Objective:  There were no vitals filed for this visit. Constitutional Patient is a pleasant 60 y.o. African American male morbidly obese in NAD. AAO x 3.  Vascular Capillary fill time to digits <3 seconds b/l lower extremities. Palpable pedal pulses b/l LE. Pedal hair absent. Lower extremity skin temperature gradient within normal limits. No pain with calf compression b/l. No cyanosis or clubbing noted.  Neurologic Normal speech. Protective sensation intact 5/5 intact bilaterally with 10g monofilament b/l.  Vibratory sensation intact b/l.  Dermatologic Toenails 1-5 b/l elongated, discolored, dystrophic, thickened, crumbly with subungual debris and tenderness to dorsal palpation. Incurvated nailplate lateral border(s) R hallux.  Nail border hypertrophy minimal. There is tenderness to palpation. Sign(s) of infection: no clinical signs of infection noted on examination today..  Orthopedic: Normal muscle strength 5/5 to all lower extremity muscle groups bilaterally. No pain crepitus or joint limitation noted with ROM b/l. Pes planus deformity noted b/l.    No flowsheet data found.     Assessment:   1. Ingrown toenail without infection   2. Onychomycosis   3. Type 2 diabetes mellitus with hyperglycemia, without long-term current use of insulin (HCC)    Plan:  Patient was evaluated and treated and all questions answered.  Onychomycosis with pain -Nails palliatively debridement as below. -Educated on self-care  Procedure: Nail Debridement Rationale: Pain Type of Debridement: manual, sharp debridement. Instrumentation: Nail nipper, rotary burr. Number of Nails: 10  -Examined patient. -Patient to continue soft, supportive shoe gear daily. -Toenails 1-5 b/l were debrided in length and girth with sterile nail nippers and dremel without iatrogenic bleeding.  -Offending nail border debrided and curretaged R hallux utilizing sterile nail nipper and currette. Border(s) cleansed with alcohol and triple antibiotic ointment applied. Patient instructed to apply Mupirocin Ointment to R hallux once daily for 14 days. -Patient to report any pedal injuries to medical professional immediately. -Patient/POA to call should there be question/concern in the interim.  No follow-ups on file.  Freddie Breech, DPM

## 2021-07-03 DIAGNOSIS — G4733 Obstructive sleep apnea (adult) (pediatric): Secondary | ICD-10-CM | POA: Diagnosis not present

## 2021-07-03 DIAGNOSIS — E119 Type 2 diabetes mellitus without complications: Secondary | ICD-10-CM | POA: Diagnosis not present

## 2021-07-03 DIAGNOSIS — Z6841 Body Mass Index (BMI) 40.0 and over, adult: Secondary | ICD-10-CM | POA: Diagnosis not present

## 2021-07-04 ENCOUNTER — Other Ambulatory Visit: Payer: Self-pay | Admitting: Surgery

## 2021-07-04 ENCOUNTER — Other Ambulatory Visit (HOSPITAL_COMMUNITY): Payer: Self-pay | Admitting: Surgery

## 2021-07-04 DIAGNOSIS — E669 Obesity, unspecified: Secondary | ICD-10-CM

## 2021-07-05 ENCOUNTER — Telehealth: Payer: Self-pay | Admitting: *Deleted

## 2021-07-05 ENCOUNTER — Telehealth: Payer: Self-pay

## 2021-07-05 NOTE — Telephone Encounter (Signed)
NOTES ON FILE FROM [facility] [phone] SENT REFERRAL TO SCHEDULING..... 

## 2021-07-05 NOTE — Telephone Encounter (Signed)
Pt had called back though I was not available. I tried to call the pt back and got his vm again. I will call him back on Monday 7/18.

## 2021-07-05 NOTE — Telephone Encounter (Signed)
LEFT MESSAGE FOR PT TO CALL THE OFFICE TO SCHEDULE A NEW PT APPT. PT HAS BEEN REFERRED TO CARDIOLOGY FOR PRE OP CLEARANCE.   [facility] Pre-operative Risk Assessment    Patient Name: Bryan Wells  DOB: 03-Apr-1961 MRN: [id number]  HEARTCARE STAFF:  - IMPORTANT!!!!!! Under Visit Info/Reason for Call, type in Other and utilize the format Clearance MM/DD/YY or Clearance TBD. Do not use dashes or single digits. - Please review there is not already an duplicate clearance open for this procedure. - If request is for dental extraction, please clarify the # of teeth to be extracted. - If the patient is currently at the dentist's office, call Pre-Op Callback Staff (MA/nurse) to input urgent request.  - If the patient is not currently in the dentist office, please route to the Pre-Op pool.  Request for surgical clearance: PT HAS BEEN REFERRED TO CARDIOLOGY AS NEW PT FOR PRE OP CLEARANCE   What type of surgery is being performed? SLEEVE GASTRECTOMY   When is this surgery scheduled? TBD  What type of clearance is required (medical clearance vs. Pharmacy clearance to hold med vs. Both)? MEDICAL  Are there any medications that need to be held prior to surgery and how long?  ASA   Practice name and name of physician performing surgery? [facility]; DR. Joya San  What is the office phone number? [phone]   7.   What is the office fax number? [phone]  8.   Anesthesia type (None, local, MAC, general) ? GENERAL   Julaine Hua 07/05/2021, 3:45 PM  _________________________________________________________________   (provider comments below)

## 2021-07-08 NOTE — Telephone Encounter (Signed)
Pt returning call

## 2021-07-08 NOTE — Telephone Encounter (Signed)
Please add preoperative clearance to patient's upcoming visit notification.  I will remove patient from preoperative pool and defer clearance to visit at that time.  Thank you.  Thomasene Ripple. Saron Tweed NP-C    07/08/2021, 1:13 PM [facility] [address] Office [phone] Fax [phone]

## 2021-07-08 NOTE — Telephone Encounter (Signed)
Left message for patient to call back to schedule (per pre-op, okay to offer 48 HR NP slots on 7/22 with Dr. Izora Ribas or Dr. Abe People). See below.

## 2021-07-08 NOTE — Telephone Encounter (Signed)
Left message for pt to call the office [phone] to schedule a NEW PT APPT for PRE OP CLEARANCE. Pt was referred to our office by Dr. Katha Cabal. I was calling pt to offer New Pt appt with either Dr. Eldridge Dace 07/12/21 @ 3:40 or with Dr. Izora Ribas 07/12/21 @ 3:20. I will hand off the referral records to our chart prep team to hold until pt has been scheduled.

## 2021-07-08 NOTE — Telephone Encounter (Signed)
I returned call to the pt, sorry we keep missing each other's call back. I left message to call back and ask for me so that I may get him scheduled for a New Pt appt for pre op clearance .

## 2021-07-09 NOTE — Telephone Encounter (Signed)
Contacted patient to schedule a new patient appointment. He requested to be seen in August.  Appointment scheduled with Dr Izora Ribas on July 23, 2021. Patient agreeable and voiced understanding.

## 2021-07-10 ENCOUNTER — Ambulatory Visit (HOSPITAL_COMMUNITY): Payer: Medicare HMO

## 2021-07-10 ENCOUNTER — Encounter (HOSPITAL_COMMUNITY): Payer: Self-pay

## 2021-07-18 ENCOUNTER — Ambulatory Visit (HOSPITAL_COMMUNITY)
Admission: RE | Admit: 2021-07-18 | Discharge: 2021-07-18 | Disposition: A | Discharge status: Home / Self Care | Payer: Medicare HMO | Source: Ambulatory Visit | Attending: Surgery | Admitting: Surgery

## 2021-07-18 ENCOUNTER — Other Ambulatory Visit: Payer: Self-pay | Admitting: Surgery

## 2021-07-18 ENCOUNTER — Other Ambulatory Visit: Payer: Self-pay

## 2021-07-18 DIAGNOSIS — E669 Obesity, unspecified: Secondary | ICD-10-CM

## 2021-07-18 DIAGNOSIS — I700 Atherosclerosis of aorta: Secondary | ICD-10-CM | POA: Diagnosis not present

## 2021-07-22 NOTE — Progress Notes (Signed)
Cardiology Office Note:    Date:  07/23/2021   ID:  Bryan Wells, DOB Dec 08, 1961, MRN [id number]  PCP:  Georgann Housekeeper, MD   [facility] Providers Cardiologist:  Christell Constant, MD     Referring MD: Georgann Housekeeper, MD  CC: Pre-op surgery Consulted for the evaluation of pre-op for Dr. Binnie Kand at the behest of Georgann Housekeeper, MD  History of Present Illness:    Bryan Wells is a 60 y.o. male with a hx of Morbid Obesity, Thoracoabdominal Aortic Aneurysm (Type B descending) with surgery in 2009  s/p stenting, HTN, HLD query of post procedural MI, who presents for evaluation prior to sleeve gastrectomy.  Patient notes that he is feeling ready to make changes.  Patient notes that after getting follow up for his aortic aneurysm.  Has had no chest pain, chest pressure, chest tightness, chest stinging. Patient exertion notable for being able to go upstairs and walk a block but needs a cane and has knee pain.  No shortness of breath, DOE with exertion .  No PND or orthopnea.  Notes weight gain, denies leg swelling and does not take his PRN lasix (his ex-wife had CHF and he is aware of the symptoms).  No syncope or near syncope . Notes  no palpitations or funny heart beats.   No leg claudication.  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  Past Medical History:  Diagnosis Date   AAA (abdominal aortic aneurysm) (HCC)    followed by vascular -- dr Jenne Pane-- infrarenal AAA and proximal descending thoracic    Bell's palsy 05/2017   left side-- per pt residual mild stiffness   History of aortic dissection 09/2008   type B  s/p  stenting BMS's distal descending thoracic into bilateral iliac arteries   History of MI (myocardial infarction)    per vascular note in epic,  post op complicattion aortic/ iliac stenting MI with tracheostomy for prolonged vent.   Hyperlipidemia    hypercholesterolemia   Hypertension    Morbid obesity with  BMI of 50.0-59.9, adult (HCC)    OSA on CPAP    Type 2 diabetes mellitus treated with insulin (HCC)    followed by pcp   Wears dentures    upper   Wears glasses     Past Surgical History:  Procedure Laterality Date   COLONOSCOPY WITH PROPOFOL N/A 02/22/2019   Procedure: COLONOSCOPY WITH PROPOFOL;  Surgeon: Graylin Shiver, MD;  Location: [facility];  Service: Endoscopy;  Laterality: N/A;   DESCENDING AORTIC ANEURYSM REPAIR W/ STENT  10/ 2009  in [place]   BMS x2 distal portion extending into bilateral common iliac arteries   ESOPHAGOGASTRODUODENOSCOPY (EGD) WITH PROPOFOL N/A 02/22/2019   Procedure: ESOPHAGOGASTRODUODENOSCOPY (EGD) WITH PROPOFOL;  Surgeon: Graylin Shiver, MD;  Location: [facility];  Service: Endoscopy;  Laterality: N/A;    Current Medications: Current Meds  Medication Sig   Accu-Chek Softclix Lancets lancets daily.   acetaminophen-codeine (TYLENOL #3) 300-30 MG tablet    aspirin EC 81 MG tablet Take 81 mg by mouth daily.   atorvastatin (LIPITOR) 10 MG tablet Take 10 mg by mouth daily.   clotrimazole-betamethasone (LOTRISONE) cream Apply 1 application topically 2 (two) times daily as needed (for skin fold/skin irritation.).    Coenzyme Q10 (CO Q 10) 100 MG CAPS 1 capsule   dapagliflozin propanediol (FARXIGA) 10 MG TABS tablet 1 tablet   diclofenac Sodium (VOLTAREN) 1 %  GEL    diltiazem (CARDIZEM CD) 240 MG 24 hr capsule Take 240 mg by mouth at bedtime.    furosemide (LASIX) 40 MG tablet Take 40 mg by mouth daily.    glimepiride (AMARYL) 2 MG tablet 1 tablet with breakfast or the first main meal of the day   glucose blood test strip use to check blood sugars   ibuprofen (ADVIL,MOTRIN) 200 MG tablet Take 600 mg by mouth every 8 (eight) hours as needed for mild pain (pain.).    insulin degludec (TRESIBA FLEXTOUCH) 200 UNIT/ML FlexTouch Pen INJECT 40 units   Insulin Pen Needle (BD PEN NEEDLE MICRO U/F) 32G X 6 MM MISC See admin instructions.   labetalol (NORMODYNE)  300 MG tablet Take 300 mg by mouth at bedtime.    meloxicam (MOBIC) 15 MG tablet TK 1 T PO QD WITH FOOD FOR 2 WEEK THEN PRN   metFORMIN (GLUCOPHAGE) 1000 MG tablet Take 500 mg by mouth 2 (two) times daily.    mupirocin ointment (BACTROBAN) 2 % Apply to left great toe once daily for abrasion.   Needle, Disp, 32G X 5/16" MISC See admin instructions.   NONFORMULARY OR COMPOUNDED ITEM Antifungal solution: Terbinafine 3%, Fluconazole 2%, Tea Tree Oil 5%, Urea 10%, Ibuprofen 2% in DMSO suspension #31mL (Patient taking differently: as needed. Antifungal solution: Terbinafine 3%, Fluconazole 2%, Tea Tree Oil 5%, Urea 10%, Ibuprofen 2% in DMSO suspension #84mL)   pantoprazole (PROTONIX) 40 MG tablet Take 1 tablet by mouth 2 (two) times daily.   tiZANidine (ZANAFLEX) 4 MG tablet Take 1 tablet by mouth at bedtime as needed.   triamcinolone ointment (KENALOG) 0.1 % Apply 1 application topically as needed.   [DISCONTINUED] aspirin 81 MG chewable tablet 1 tablet   [DISCONTINUED] atorvastatin (LIPITOR) 10 MG tablet 1 tablet   [DISCONTINUED] BOOSTRIX 5-2.5-18.5 LF-MCG/0.5 injection    [DISCONTINUED] ciclopirox (LOPROX) 0.77 % cream Apply to both feet and between toes twice daily for 4 weeks. (Patient taking differently: Apply 1 application topically daily as needed (affected area(s) both feet and between toes).)   [DISCONTINUED] clindamycin (CLEOCIN) 300 MG capsule    [DISCONTINUED] diclofenac Sodium (VOLTAREN) 1 % GEL See admin instructions.   [DISCONTINUED] diltiazem (TIAZAC) 240 MG 24 hr capsule    [DISCONTINUED] diltiazem (TIAZAC) 240 MG 24 hr capsule 1 capsule   [DISCONTINUED] ibuprofen (ADVIL) 800 MG tablet    [DISCONTINUED] ketorolac (TORADOL) 30 MG/ML injection Inject 30 mg into the muscle once.   [DISCONTINUED] labetalol (NORMODYNE) 300 MG tablet 1 tablet   [DISCONTINUED] MEDROL 4 MG TBPK tablet Take by mouth.   [DISCONTINUED] metFORMIN (GLUCOPHAGE) 1000 MG tablet Take 1 tablet by mouth 2 (two) times  daily with a meal.   [DISCONTINUED] omeprazole (PRILOSEC) 20 MG capsule Take 20 mg by mouth daily before breakfast.    [DISCONTINUED] pantoprazole (PROTONIX) 40 MG tablet    [DISCONTINUED] triamcinolone acetonide (KENALOG-40) 40 MG/ML injection Inject 2 mLs into the muscle once.     Allergies:   Ciprofloxacin, Hydrochlorothiazide, Ibuprofen, Lisinopril, and Penicillins   Social History   Socioeconomic History   Marital status: Divorced    Spouse name: Not on file   Number of children: Not on file   Years of education: Not on file   Highest education level: Not on file  Occupational History   Not on file  Tobacco Use   Smoking status: Former    Years: 20.00    Types: Cigarettes    Quit date: 02/21/2012    Years  since quitting: 9.4   Smokeless tobacco: Never  Vaping Use   Vaping Use: Never used  Substance and Sexual Activity   Alcohol use: No   Drug use: Never   Sexual activity: Not on file  Other Topics Concern   Not on file  Social History Narrative   Not on file   Social Determinants of Health   Financial Resource Strain: Not on file  Food Insecurity: Not on file  Transportation Needs: Not on file  Physical Activity: Not on file  Stress: Not on file  Social Connections: Not on file     Family History: The patient's family history includes Coronary artery disease in his sister; Diabetes in his father; Hyperlipidemia in his brother and sister; Hypertension in his sister; Obesity in his sister. Sister had a history of aortic dissection.  Patient has three children.  ROS:   Please see the history of present illness.     All other systems reviewed and are negative.  EKGs/Labs/Other Studies Reviewed:    The following studies were reviewed today:  EKG:  EKG is  ordered today.  The ekg ordered today demonstrates  SR rate 89 Non specific TWI  CT AORTA: Date:09/29/2018 Results: IMPRESSION: 1. Stable type B aortic dissection extending from just beyond the left  subclavian origin through the abdominal aorta. 2. 4.7 cm proximal descending thoracic aortic aneurysm, previously 4.6 cm. 3. 5.1 cm infrarenal abdominal aortic aneurysm, previously 4.7 cm. LCX CAC and aortic atherosclerosis from 06/10/2017   NM Stress Testing : Date:04/30/2010 Results: IMPRESSION:     1.  No findings for ischemia or infarction.  2.  Mild diaphragmatic attenuation in the inferior wall.  3.  Artifact at the apex on the horizontal long axis images.  4.  Estimated ejection fraction is 58%.   Recent Labs: No results found for requested labs within last 8760 hours.  Recent Lipid Panel No results found for: CHOL, TRIG, HDL, CHOLHDL, VLDL, LDLCALC, LDLDIRECT      Physical Exam:    VS:  BP 140/68   Pulse 88   Ht 5\' 11"  (1.803 m)   Wt (!) 404 lb (183.3 kg)   SpO2 97%   BMI 56.35 kg/m     Wt Readings from Last 3 Encounters:  07/23/21 (!) 404 lb (183.3 kg)  02/22/19 (!) 435 lb (197.3 kg)  11/15/18 (!) 435 lb (197.3 kg)     GEN:  Morbidly Obese well developed in no acute distress HEENT: Normal NECK: No JVD; No carotid bruits LYMPHATICS: No lymphadenopathy CARDIAC: RRR, no murmurs, rubs, gallops; distant heart sounds RESPIRATORY:  Clear to auscultation without rales, wheezing or rhonchi  ABDOMEN: Soft, non-tender, non-distended MUSCULOSKELETAL:  No edema; No deformity  SKIN: Warm and dry NEUROLOGIC:  Alert and oriented x 3 PSYCHIATRIC:  Normal affect   ASSESSMENT:    1. History of dissection of thoracic aorta   2. Hypertension, unspecified type    PLAN:    Preoperative Risk Assessment - low moderate risk given prior dissection- has had no issues with this currently - DASI score of 19 associated with 5 functional mets - No further cardiac testing is recommended prior to surgery.  - The patient may proceed to surgery at acceptable risk.   - Will get CT Aorta; if stable we will discuss with Dr. Benay PikeMatthew Brunson Marin [phone]) that it would be  reasonable to proceed to surgery - Our service is available as needed in the peri-operative period.    Thoracoabdominal Aortic Aneurysm  and Dissection Morbid Obesity HTN and DM CAC and Aortic Atherosclerosis  - will get CT Aorta as above - at next visit will discuss BP f/u, LDL goals, and medication optimization - will discuss genetic counseling given dissection in patient and in sister  Post surgery follow up unless new symptoms or abnormal test results warranting change in plan       Medication Adjustments/Labs and Tests Ordered: Current medicines are reviewed at length with the patient today.  Concerns regarding medicines are outlined above.  Orders Placed This Encounter  Procedures   CT ANGIO CHEST AORTA W/CM & OR WO/CM   Basic metabolic panel   EKG 12-Lead    No orders of the defined types were placed in this encounter.   Patient Instructions  Medication Instructions:  Your physician recommends that you continue on your current medications as directed. Please refer to the Current Medication list given to you today.  *If you need a refill on your cardiac medications before your next appointment, please call your pharmacy*   Lab Work: TODAY: BMET If you have labs (blood work) drawn today and your tests are completely normal, you will receive your results only by: MyChart Message (if you have MyChart) OR A paper copy in the mail If you have any lab test that is abnormal or we need to change your treatment, we will call you to review the results.   Testing/Procedures: Your physician has requested that you have a CT Aorta   Follow-Up: At [facility], you and your health needs are our priority.  As part of our continuing mission to provide you with exceptional heart care, we have created designated Provider Care Teams.  These Care Teams include your primary Cardiologist (physician) and Advanced Practice Providers (APPs -  Physician Assistants and Nurse Practitioners)  who all work together to provide you with the care you need, when you need it.  We recommend signing up for the patient portal called "MyChart".  Sign up information is provided on this After Visit Summary.  MyChart is used to connect with patients for Virtual Visits (Telemedicine).  Patients are able to view lab/test results, encounter notes, upcoming appointments, etc.  Non-urgent messages can be sent to your provider as well.   To learn more about what you can do with MyChart, go to [email or web address].    Your next appointment:   4 month(s)  The format for your next appointment:   In Person  Provider:   You may see Riley Lam, MD or one of the following Advanced Practice Providers on your designated Care Team:   Ronie Spies, PA-C Jacolyn Reedy, PA-C        Signed, Christell Constant, MD  07/23/2021 4:07 PM    [facility]

## 2021-07-23 ENCOUNTER — Encounter: Payer: Self-pay | Admitting: Internal Medicine

## 2021-07-23 ENCOUNTER — Other Ambulatory Visit: Payer: Self-pay

## 2021-07-23 ENCOUNTER — Ambulatory Visit: Payer: Medicare HMO | Admitting: Internal Medicine

## 2021-07-23 VITALS — BP 140/68 | HR 88 | Ht 71.0 in | Wt >= 6400 oz

## 2021-07-23 DIAGNOSIS — Z8679 Personal history of other diseases of the circulatory system: Secondary | ICD-10-CM

## 2021-07-23 DIAGNOSIS — I152 Hypertension secondary to endocrine disorders: Secondary | ICD-10-CM | POA: Diagnosis not present

## 2021-07-23 DIAGNOSIS — I2584 Coronary atherosclerosis due to calcified coronary lesion: Secondary | ICD-10-CM | POA: Diagnosis not present

## 2021-07-23 DIAGNOSIS — E1159 Type 2 diabetes mellitus with other circulatory complications: Secondary | ICD-10-CM | POA: Diagnosis not present

## 2021-07-23 DIAGNOSIS — I715 Thoracoabdominal aortic aneurysm, ruptured, unspecified: Secondary | ICD-10-CM | POA: Insufficient documentation

## 2021-07-23 DIAGNOSIS — I700 Atherosclerosis of aorta: Secondary | ICD-10-CM | POA: Diagnosis not present

## 2021-07-23 DIAGNOSIS — I2510 Atherosclerotic heart disease of native coronary artery without angina pectoris: Secondary | ICD-10-CM

## 2021-07-23 DIAGNOSIS — I10 Essential (primary) hypertension: Secondary | ICD-10-CM | POA: Diagnosis not present

## 2021-07-23 NOTE — Patient Instructions (Signed)
Medication Instructions:  Your physician recommends that you continue on your current medications as directed. Please refer to the Current Medication list given to you today.  *If you need a refill on your cardiac medications before your next appointment, please call your pharmacy*   Lab Work: TODAY: BMET If you have labs (blood work) drawn today and your tests are completely normal, you will receive your results only by: MyChart Message (if you have MyChart) OR A paper copy in the mail If you have any lab test that is abnormal or we need to change your treatment, we will call you to review the results.   Testing/Procedures: Your physician has requested that you have a CT Aorta   Follow-Up: At [facility], you and your health needs are our priority.  As part of our continuing mission to provide you with exceptional heart care, we have created designated Provider Care Teams.  These Care Teams include your primary Cardiologist (physician) and Advanced Practice Providers (APPs -  Physician Assistants and Nurse Practitioners) who all work together to provide you with the care you need, when you need it.  We recommend signing up for the patient portal called "MyChart".  Sign up information is provided on this After Visit Summary.  MyChart is used to connect with patients for Virtual Visits (Telemedicine).  Patients are able to view lab/test results, encounter notes, upcoming appointments, etc.  Non-urgent messages can be sent to your provider as well.   To learn more about what you can do with MyChart, go to [email or web address].    Your next appointment:   4 month(s)  The format for your next appointment:   In Person  Provider:   You may see Riley Lam, MD or one of the following Advanced Practice Providers on your designated Care Team:   Ronie Spies, PA-C Jacolyn Reedy, PA-C

## 2021-07-24 LAB — BASIC METABOLIC PANEL
BUN/Creatinine Ratio: 13 (ref 9–20)
BUN: 12 mg/dL (ref 6–24)
CO2: 24 mmol/L (ref 20–29)
Calcium: 10.6 mg/dL — ABNORMAL HIGH (ref 8.7–10.2)
Chloride: 101 mmol/L (ref 96–106)
Creatinine, Ser: 0.94 mg/dL (ref 0.76–1.27)
Glucose: 141 mg/dL — ABNORMAL HIGH (ref 65–99)
Potassium: 4 mmol/L (ref 3.5–5.2)
Sodium: 140 mmol/L (ref 134–144)
eGFR: 93 mL/min/{1.73_m2} (ref >59)

## 2021-07-29 ENCOUNTER — Ambulatory Visit (INDEPENDENT_AMBULATORY_CARE_PROVIDER_SITE_OTHER)
Admission: RE | Admit: 2021-07-29 | Discharge: 2021-07-29 | Disposition: A | Discharge status: Home / Self Care | Payer: Medicare HMO | Source: Ambulatory Visit | Attending: Internal Medicine | Admitting: Internal Medicine

## 2021-07-29 ENCOUNTER — Other Ambulatory Visit: Payer: Self-pay | Admitting: Internal Medicine

## 2021-07-29 ENCOUNTER — Other Ambulatory Visit: Payer: Self-pay

## 2021-07-29 DIAGNOSIS — I715 Thoracoabdominal aortic aneurysm, ruptured, unspecified: Secondary | ICD-10-CM

## 2021-07-29 DIAGNOSIS — I712 Thoracic aortic aneurysm, without rupture: Secondary | ICD-10-CM | POA: Diagnosis not present

## 2021-07-29 DIAGNOSIS — I723 Aneurysm of iliac artery: Secondary | ICD-10-CM | POA: Diagnosis not present

## 2021-07-29 DIAGNOSIS — Z8679 Personal history of other diseases of the circulatory system: Secondary | ICD-10-CM

## 2021-07-29 DIAGNOSIS — I714 Abdominal aortic aneurysm, without rupture: Secondary | ICD-10-CM | POA: Diagnosis not present

## 2021-07-29 MED ORDER — IOHEXOL 350 MG/ML SOLN
100.0000 mL | Freq: Once | INTRAVENOUS | Status: AC | PRN
  Administered 2021-07-29: 100 mL via INTRAVENOUS

## 2021-07-29 NOTE — Addendum Note (Signed)
Addended by: Roney Mans A on: 07/29/2021 09:11 AM   Modules accepted: Orders

## 2021-08-13 ENCOUNTER — Encounter: Payer: Medicare HMO | Attending: Surgery | Admitting: Skilled Nursing Facility1

## 2021-08-13 ENCOUNTER — Other Ambulatory Visit: Payer: Self-pay

## 2021-08-13 ENCOUNTER — Encounter: Payer: Self-pay | Admitting: Skilled Nursing Facility1

## 2021-08-13 DIAGNOSIS — E1165 Type 2 diabetes mellitus with hyperglycemia: Secondary | ICD-10-CM

## 2021-08-13 NOTE — Progress Notes (Signed)
Nutrition Assessment for Bariatric Surgery Medical Nutrition Therapy Appt Start Time: 10:50    End Time: 11:50  Patient was seen on 08/13/2021 for Pre-Operative Nutrition Assessment. Letter of approval faxed to [facility] bariatric surgery program coordinator on 08/13/2021.   Referral stated Supervised Weight Loss (SWL) visits needed: 0  Pt completed visits.   Pt has cleared nutrition requirements.   Planned surgery: sleeve gastrectomy Pt expectation of surgery: to have a tool to get health back and get off medications and add years to life span Pt expectation of dietitian: none identified     NUTRITION ASSESSMENT   Anthropometrics  Start weight at [facility]: 412 lbs (date: 08/13/2021)  Height: 71 in BMI: 57.46 kg/m2     Clinical  Medical hx: HTN, sleep apnea, GERD, T2DM, bells palsy Medications: tresiba, metformin   Labs: calcium 10.6 Notable signs/symptoms: back aches, hip spur, walks with a cane Any previous deficiencies? No  Micronutrient Nutrition Focused Physical Exam: Hair: No issues observed Eyes: No issues observed Mouth: No issues observed Neck: No issues observed Nails: No issues observed Skin: No issues observed  Lifestyle & Dietary Hx  Pt states he checks his blood sugars every day or every other day averaging 120-138.  Pt states he hops in the car to go get his foods: most meals eaten out 90%+ fast food. Pt states he has been really nervous about getting bariatric surgery leading him to eat more fast food.  Pt states he does wake in the middle of the night and eat; snacking throughout the night.  Dietitian advised pt to return for a minimum of one appt to assist pt in making changes to his lifestyle prior to surgery due to his current diet consisting of mostly fast food meals: pt states he will not have his surgery put on hold; dietitian advised pt this appt would not hold pt back from surgery it would just assist in long term change and long term  goals reached and maintained; pt states he does not need the help of the dietitian as he will be following the nutri system diet as soon as he gets a surgery date   24-Hr Dietary Recall First Meal: fast food Snack:  Second Meal: fast food Snack: cherries Third Meal: fast food Snack: watermelon  Snacking throughout the night Beverages: diet soda, water, [place] sugar free juice    Estimated Energy Needs Calories: 1800   NUTRITION DIAGNOSIS  Overweight/obesity (Vance-3.3) related to past poor dietary habits and physical inactivity as evidenced by patient w/ planned sleeve gastrectomy surgery following dietary guidelines for continued weight loss.    NUTRITION INTERVENTION  Nutrition counseling (C-1) and education (E-2) to facilitate bariatric surgery goals.  Educated pt on micronutrient deficiencies post surgery and strategies to mitigate that risk   Pre-Op Goals Reviewed with the Patient Track food and beverage intake (pen and paper, MyFitness Pal, Baritastic app, etc.) Make healthy food choices while monitoring portion sizes Consume 3 meals per day or try to eat every 3-5 hours Avoid concentrated sugars and fried foods Keep sugar & fat in the single digits per serving on food labels Practice CHEWING your food (aim for applesauce consistency) Practice not drinking 15 minutes before, during, and 30 minutes after each meal and snack Avoid all carbonated beverages (ex: soda, sparkling beverages)  Limit caffeinated beverages (ex: coffee, tea, energy drinks) Avoid all sugar-sweetened beverages (ex: regular soda, sports drinks)  Avoid alcohol  Aim for 64-100 ounces of FLUID daily (with at least half  of fluid intake being plain water)  Aim for at least 60-80 grams of PROTEIN daily Look for a liquid protein source that contains ?15 g protein and ?5 g carbohydrate (ex: shakes, drinks, shots) Make a list of non-food related activities Physical activity is an important part of a healthy  lifestyle so keep it moving! The goal is to reach 150 minutes of exercise per week, including cardiovascular and weight baring activity.  *Goals that are bolded indicate the pt would like to start working towards these  Handouts Provided Include  Bariatric Surgery handouts (Nutrition Visits, Pre-Op Goals, Protein Shakes, Vitamins & Minerals)  Learning Style & Readiness for Change Teaching method utilized: Visual & Auditory  Demonstrated degree of understanding via: Teach Back  Readiness Level: contemplative  Barriers to learning/adherence to lifestyle change: pt not receptive to dietitians education offered regarding nutrition information      MONITORING & EVALUATION Dietary intake, weekly physical activity, body weight, and pre-op goals reached at next nutrition visit.    Next Steps  Patient is to follow up at [facility] for Pre-Op Class >2 weeks before surgery for further nutrition education.  Pt has completed visits. No further supervised visits required

## 2021-08-25 IMAGING — CT CT ANGIO CHEST
2 of 10 series · 15 of 46 positions shown · IV contrast (OMNIPAQUE 350)
Comparison: September 2018

CLINICAL DATA: History of thoracic aortic dissection, follow-up

EXAM:
CT ANGIOGRAPHY CHEST WITH CONTRAST
TECHNIQUE: Multidetector CT imaging of the chest, abdomen and pelvis was
performed using the standard protocol during bolus administration of
intravenous contrast. Multiplanar CT image reconstructions and MIPs
were obtained to evaluate the vascular anatomy.
CONTRAST:  100mL OMNIPAQUE IOHEXOL 350 MG/ML SOLN

[Series 7: dissection 3.0 bf37 2 · axial · 0.91mm/px · z∈[-648,-78]mm · 12 of 222 slices shown]
[im 16/222  lung]
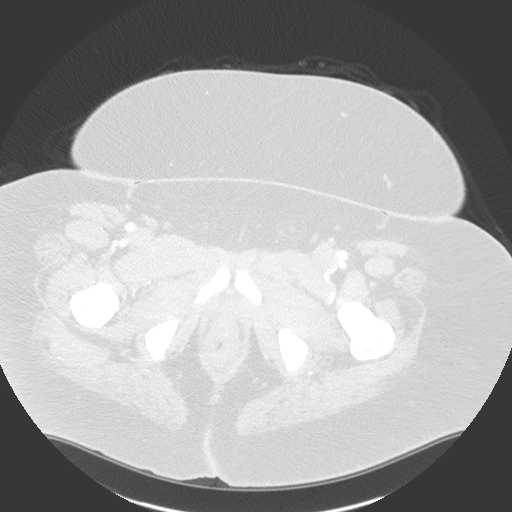
[im 32/222  soft-tissue]
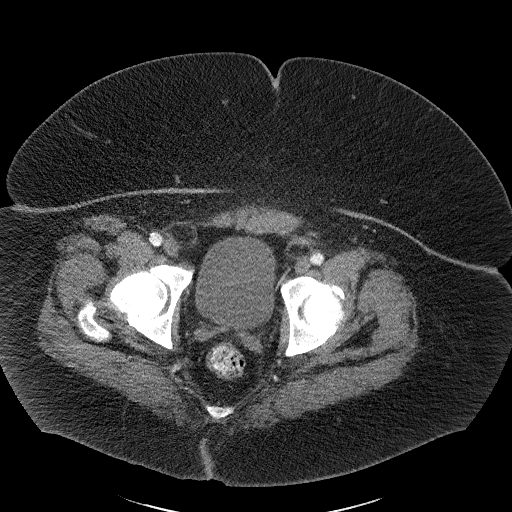
[im 48/222  lung]
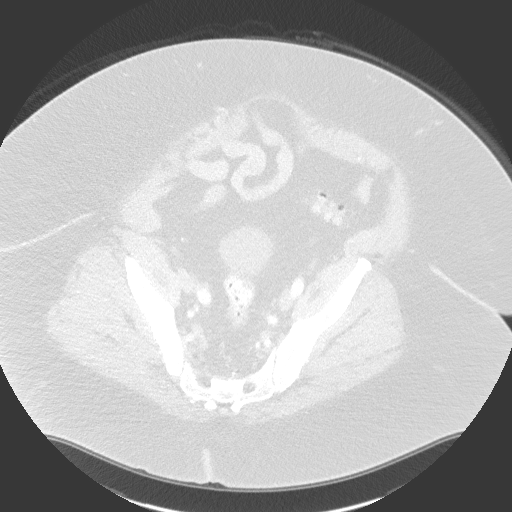
[im 64/222  soft-tissue]
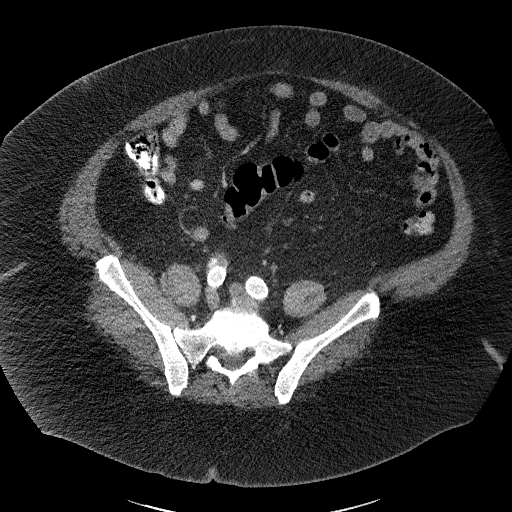
[im 79/222  lung]
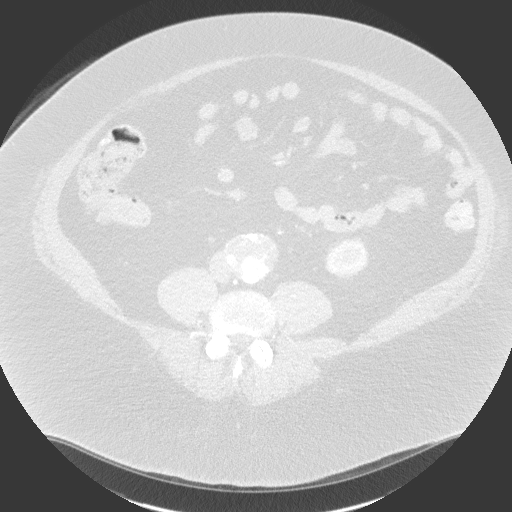
[im 95/222  soft-tissue]
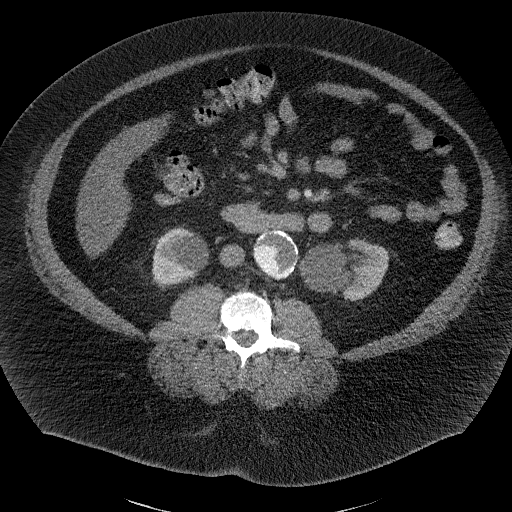
[im 127/222  lung]
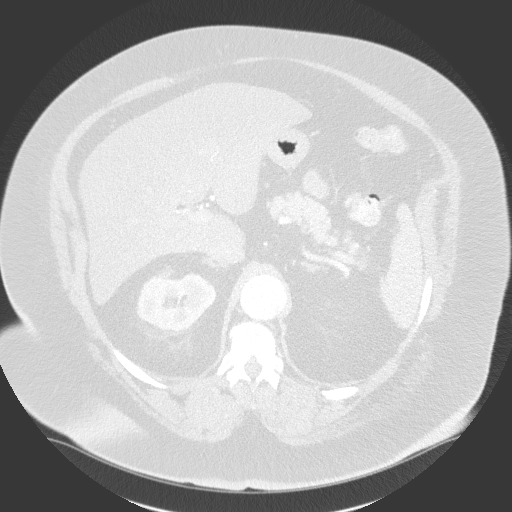
[im 143/222  soft-tissue]
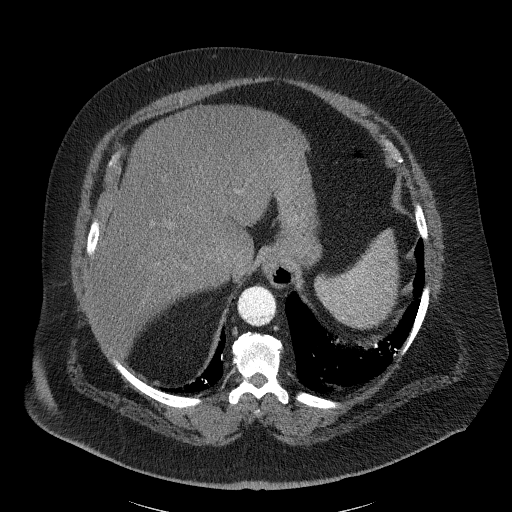
[im 158/222  lung]
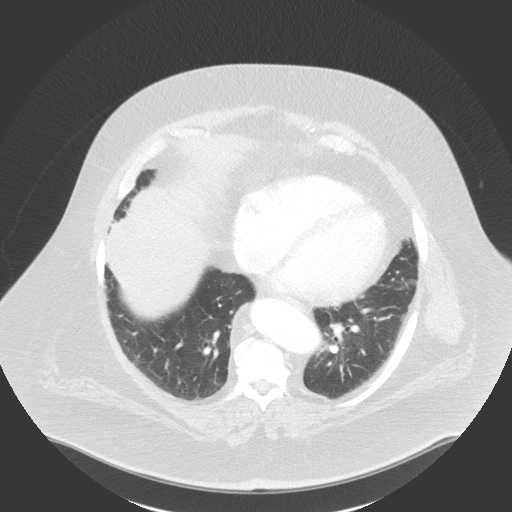
[im 174/222  soft-tissue]
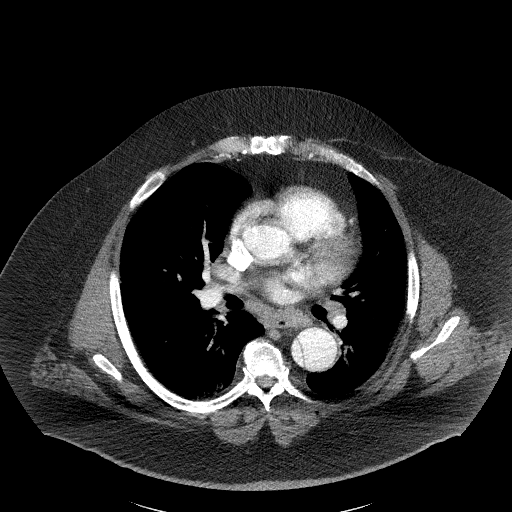
[im 190/222  lung]
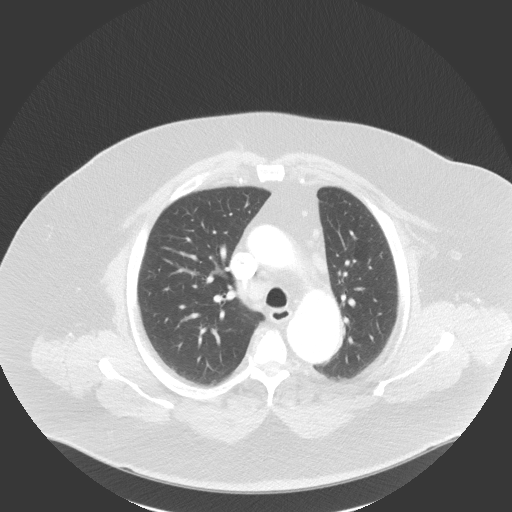
[im 206/222  soft-tissue]
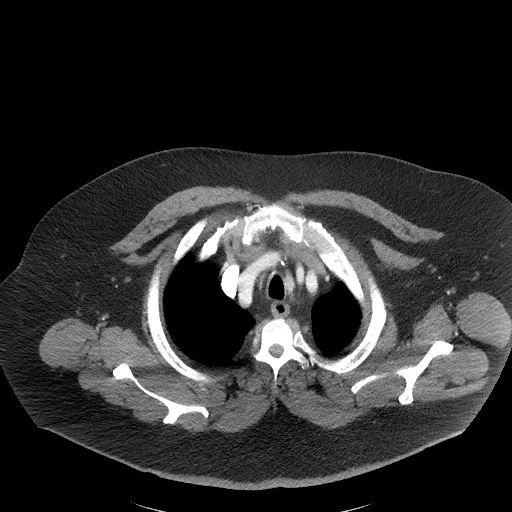

[Series 10: coronals · coronal · 0.97mm/px · 3 of 193 slices shown]
[im 49/193  soft-tissue]
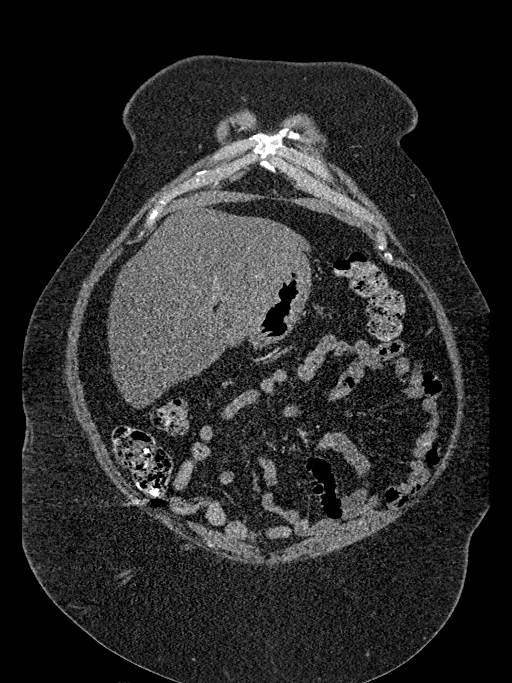
[im 97/193  soft-tissue]
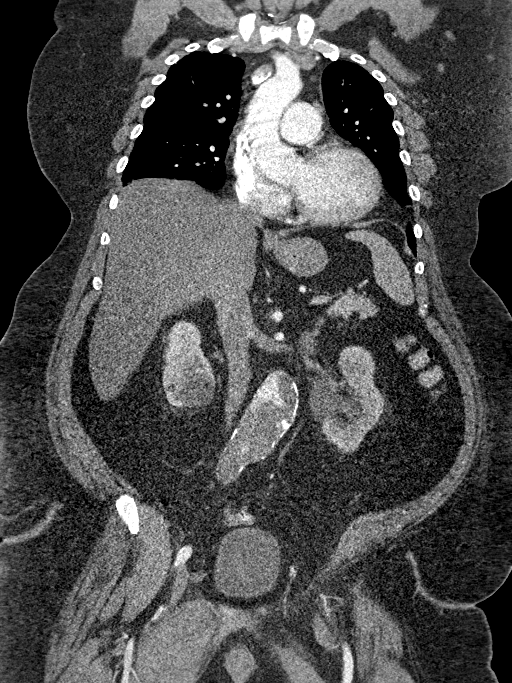
[im 145/193  soft-tissue]
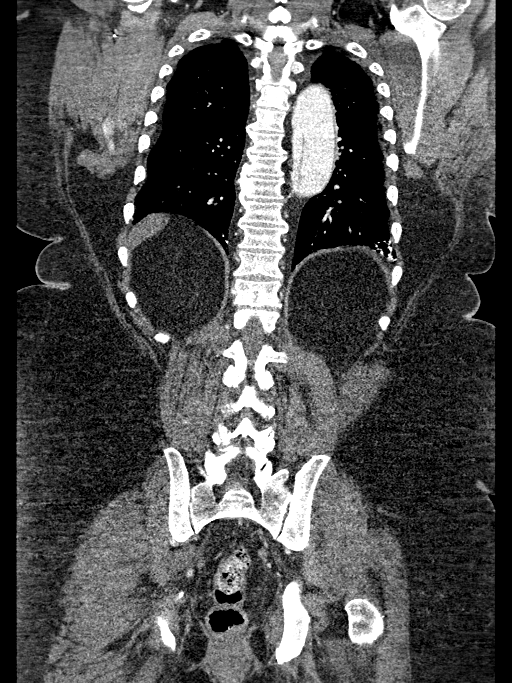

[15 of 46 positions shown; findings below may reference images not displayed]

FINDINGS: CTA CHEST:

Cardiovascular: The heart is normal in size. No pericardial
effusion. Again seen is a type B aortic dissection which extends
just distal to the origin of the left subclavian artery inferiorly
into the abdominal aorta. The supra-aortic vessels are patent
without involvement, with a common origin noted of the right
brachiocephalic artery and left common carotid arteries. The
ascending thoracic aorta is unremarkable and normal in caliber. The
proximal and mid aortic arch is also normal in caliber. The distal
aortic arch measures up to 3.6 cm, previously similar in size. The
proximal descending thoracic aorta measures up to 4.8 cm, previously
4.7 cm. The more distal descending thoracic aorta measures up to
3.8-4.0 cm, again similar in size. Overall, there is no significant
change in the morphology of the dissection.

Mediastinum/Nodes: No pathologic lymphadenopathy in the axilla or
mediastinum. A few prominent paratracheal lymph nodes are unchanged.
The thyroid gland appears normal.

Lungs/Pleura: No pleural effusion. No pneumothorax. No mass or focal
consolidation. No suspicious pulmonary nodules.

Musculoskeletal: No aggressive osseous lesions. Scattered
degenerative changes of the thoracic spine.

CTA ABDOMEN AND PELVIS:

VASCULAR

Aorta: Extension of the type B aortic dissection to the iliac
bifurcation, where there are bilateral common iliac artery stents,
described in further detail below. Again seen is aneurysmal dilation
of the infrarenal abdominal aorta up to 4.7 cm, which remains
similar in size and morphology. There is extension of the aneurysm
sac to involve the right common iliac artery, which is aneurysmal up
to 3.1 cm, previously 3.0 cm.

Celiac: Patent and arising from the true lumen.

SMA: Patent and arising from the true lumen.

IMA: Pain, arising from the false lumen.

Renals: There appears to be a single right renal artery which arises
from the false lumen. There appears to be 3 left renal arteries, all
of which arise from the true lumen.

Inflow: There are bilateral common iliac artery stents. The superior
aspect of the stents appear to be narrowed within the true lumen,
possibly due to the morphology of the dissection flap, however the
stents remain otherwise patent throughout. The remainder of the
iliac arteries and femoral arteries are patent without disease.

Veins: No obvious venous abnormality within the limitations of this
arterial phase study.

NON-VASCULAR

Hepatobiliary: The liver is normal in size without focal
abnormality. No intrahepatic or extrahepatic biliary ductal
dilation. The gallbladder appears normal.

Spleen: Normal in size without focal abnormality.

Pancreas: No pancreatic ductal dilatation or surrounding
inflammatory changes.

Adrenals/Urinary Tract: Adrenal glands are unremarkable. Bilateral
low-attenuation renal lesions measure within fluid density,
consistent with renal cysts. Left extrarenal pelvis has increased in
prominence since previous exam. Bladder is unremarkable.

Stomach/Bowel: The stomach, small bowel and large bowel are normal
in caliber without abnormal wall thickening or surrounding
inflammatory changes. Small hiatal hernia. Scattered diverticula in
the descending colon. The appendix is normal.

Reproductive: Prostate is unremarkable.

Lymphatic: No enlarged lymph nodes in the abdomen or pelvis.

Other: No abdominopelvic ascites.

Musculoskeletal: No aggressive osseous lesions. The soft tissues are
unremarkable.
IMPRESSION: Overall, there is a stable appearance of the type B aortic
dissection extending from the origin of the left subclavian artery
to the aortic bifurcation where there are patent bilateral common
iliac artery stents. Stable sizes of the aneurysmal components of
the descending thoracic aorta and infrarenal abdominal aorta
measuring up to 4.8 cm and 4.7 cm, respectively. Stable size of the
right common iliac artery aneurysm measuring up to 3.1 cm. No
findings of malperfusion or other complicating features.

## 2021-09-13 ENCOUNTER — Other Ambulatory Visit: Payer: Self-pay

## 2021-09-13 ENCOUNTER — Ambulatory Visit: Payer: Medicare HMO | Admitting: Podiatry

## 2021-09-13 ENCOUNTER — Encounter: Payer: Self-pay | Admitting: Podiatry

## 2021-09-13 DIAGNOSIS — B351 Tinea unguium: Secondary | ICD-10-CM | POA: Diagnosis not present

## 2021-09-13 DIAGNOSIS — E1165 Type 2 diabetes mellitus with hyperglycemia: Secondary | ICD-10-CM

## 2021-09-16 DIAGNOSIS — Z01818 Encounter for other preprocedural examination: Secondary | ICD-10-CM | POA: Diagnosis not present

## 2021-09-16 DIAGNOSIS — R21 Rash and other nonspecific skin eruption: Secondary | ICD-10-CM | POA: Diagnosis not present

## 2021-09-16 DIAGNOSIS — Z23 Encounter for immunization: Secondary | ICD-10-CM | POA: Diagnosis not present

## 2021-09-17 NOTE — Progress Notes (Signed)
  Subjective:  Patient ID: Bryan Wells, male    DOB: [birth year],  MRN: [id number]  60 y.o. male presents preventative diabetic foot care for mycotic toenails that are difficult to trim. Pain interferes with ambulation. Aggravating factors include wearing enclosed shoe gear. Pain is relieved with periodic professional debridement.  Patient states blood glucose was 124 mg/dl on yesterday.  Patient did not check blood glucose today.  He states his ingrown toenail of right great toe resolved after last visit.  He states he has started process for bariatric surgery and his children are supporting his decision.   PCP is Georgann Housekeeper, MD , and last visit was 01/29/2021.  Allergies  Allergen Reactions   Ciprofloxacin     Other reaction(s): hives   Hydrochlorothiazide     Other reaction(s): Swelling   Ibuprofen     Other reaction(s): stomach upset   Lisinopril Swelling    Other reaction(s): angioedma   Penicillins Other (See Comments)    Loss of consciousness  Has patient had a PCN reaction causing immediate rash, facial/tongue/throat swelling, SOB or lightheadedness with hypotension: Yes Has patient had a PCN reaction causing severe rash involving mucus membranes or skin necrosis: No Has patient had a PCN reaction that required hospitalization: Unknown Has patient had a PCN reaction occurring within the last 10 years: No If all of the above answers are "NO", then may proceed with Cephalosporin use.     Review of Systems: Negative except as noted in the HPI.   Objective:  Vascular Examination: Capillary fill time to digits <3 seconds b/l lower extremities. Palpable DP pulse(s) b/l lower extremities Palpable PT pulse(s) b/l lower extremities Pedal hair absent. Lower extremity skin temperature gradient within normal limits. No pain with calf compression b/l.  Neurological Examination: Protective sensation intact 5/5 intact bilaterally with 10g monofilament b/l. Vibratory sensation  intact b/l.  Dermatological Examination: Skin warm and supple b/l lower extremities. No open wounds b/l lower extremities. No interdigital macerations b/l lower extremities. Toenails 1-5 b/l elongated, discolored, dystrophic, thickened, crumbly with subungual debris and tenderness to dorsal palpation.  Musculoskeletal Examination: Normal muscle strength 5/5 to all lower extremity muscle groups bilaterally. No pain crepitus or joint limitation noted with ROM b/l lower extremities. Pes planus deformity noted b/l lower extremities.  Radiographs: None Assessment:   1. Onychomycosis   2. Type 2 diabetes mellitus with hyperglycemia, without long-term current use of insulin (HCC)    Plan:  -No new findings. No new orders. -Continue diabetic foot care principles: inspect feet daily, monitor glucose as recommended by PCP and/or Endocrinologist, and follow prescribed diet per PCP, Endocrinologist and/or dietician. -Patient to continue soft, supportive shoe gear daily. -Toenails 1-5 b/l were debrided in length and girth with sterile nail nippers and dremel without iatrogenic bleeding.  -Patient to report any pedal injuries to medical professional immediately. -Patient/POA to call should there be question/concern in the interim.  Return in about 3 months (around 12/13/2021).  Freddie Breech, DPM

## 2021-09-19 DIAGNOSIS — Z01818 Encounter for other preprocedural examination: Secondary | ICD-10-CM | POA: Diagnosis not present

## 2021-10-11 DIAGNOSIS — G4733 Obstructive sleep apnea (adult) (pediatric): Secondary | ICD-10-CM | POA: Diagnosis not present

## 2021-10-14 ENCOUNTER — Ambulatory Visit: Payer: Medicare HMO | Admitting: Skilled Nursing Facility1

## 2021-10-29 ENCOUNTER — Other Ambulatory Visit: Payer: Self-pay

## 2021-10-29 ENCOUNTER — Encounter: Payer: Medicare HMO | Attending: Surgery | Admitting: Skilled Nursing Facility1

## 2021-10-29 DIAGNOSIS — E1169 Type 2 diabetes mellitus with other specified complication: Secondary | ICD-10-CM | POA: Diagnosis not present

## 2021-10-29 NOTE — Progress Notes (Signed)
Supervised Weight Loss Visit Bariatric Nutrition Education  Sleeve Gastrecotmy  NUTRITION ASSESSMENT  1 of 2 SWL visits  Anthropometrics  Start weight at [facility]: 412 lbs (date: 08/13/2021) Today's weight: 429.1 lbs BMI: 57.46 kg/m2    Clinical  Medical hx: HTN, sleep apnea, GERD, T2DM, bells palsy Medications: tresiba, metformin   Labs: calcium 10.6 Notable signs/symptoms: back aches, hip spur, walks with a cane Any previous deficiencies? No  Lifestyle & Dietary Hx  Pt states his insurance requires 2 SWL visits.  Pt states he was not going to start making changes until he got a surgery date so now that he has gotten that he has started making changes. Pt states he has cut out sweets.  Pt states he is going to purchase the nutrisystem.   Estimated daily fluid intake:  oz Supplements:  Current average weekly physical activity:   24-Hr Dietary Recall First Meal: almond milk + banana + blueberries  Snack:  Second Meal: rotisserie chicken salad sandwich on rye Snack 3pm: 2 packets of oatmeal + milk Third Meal: 1.5 cans tuna fish sandwich on a sub + 2 bananas or salmon rice and onion Snack:  Beverages: water, diet coke  Estimated Energy Needs Calories: 1800   NUTRITION DIAGNOSIS  Overweight/obesity ([employer]-3.3) related to past poor dietary habits and physical inactivity as evidenced by patient w/ planned sleeve gastrectomy surgery following dietary guidelines for continued weight loss.   NUTRITION INTERVENTION  Nutrition counseling (C-1) and education (E-2) to facilitate bariatric surgery goals.  Pre-Op Goals Progress & New Goals -talk to your doctor about starting PT -cut portions in half   Learning Style & Readiness for Change Teaching method utilized: Visual & Auditory  Demonstrated degree of understanding via: Teach Back  Readiness Level: pre-contemplative  Barriers to learning/adherence to lifestyle change: unidentified  RD's Notes for next Visit  Assess pts  adherence to chosen goals   MONITORING & EVALUATION Dietary intake, weekly physical activity, body weight, and pre-op goals in 1 month.   Next Steps  Patient is to return to [facility] for next SWL

## 2021-11-01 DIAGNOSIS — M25552 Pain in left hip: Secondary | ICD-10-CM | POA: Diagnosis not present

## 2021-11-01 DIAGNOSIS — M25551 Pain in right hip: Secondary | ICD-10-CM | POA: Diagnosis not present

## 2021-11-01 DIAGNOSIS — M25562 Pain in left knee: Secondary | ICD-10-CM | POA: Diagnosis not present

## 2021-11-25 ENCOUNTER — Ambulatory Visit: Payer: Medicare HMO | Admitting: Internal Medicine

## 2021-12-03 ENCOUNTER — Encounter: Payer: Medicare HMO | Attending: Surgery | Admitting: Skilled Nursing Facility1

## 2021-12-03 ENCOUNTER — Ambulatory Visit: Payer: Medicare HMO | Admitting: Dietician

## 2021-12-03 ENCOUNTER — Other Ambulatory Visit: Payer: Self-pay

## 2021-12-03 DIAGNOSIS — K219 Gastro-esophageal reflux disease without esophagitis: Secondary | ICD-10-CM | POA: Insufficient documentation

## 2021-12-03 DIAGNOSIS — Z7984 Long term (current) use of oral hypoglycemic drugs: Secondary | ICD-10-CM | POA: Diagnosis not present

## 2021-12-03 DIAGNOSIS — E119 Type 2 diabetes mellitus without complications: Secondary | ICD-10-CM | POA: Insufficient documentation

## 2021-12-03 DIAGNOSIS — G4730 Sleep apnea, unspecified: Secondary | ICD-10-CM | POA: Diagnosis not present

## 2021-12-03 DIAGNOSIS — Z713 Dietary counseling and surveillance: Secondary | ICD-10-CM | POA: Insufficient documentation

## 2021-12-03 DIAGNOSIS — Z6841 Body Mass Index (BMI) 40.0 and over, adult: Secondary | ICD-10-CM | POA: Insufficient documentation

## 2021-12-03 DIAGNOSIS — I10 Essential (primary) hypertension: Secondary | ICD-10-CM | POA: Insufficient documentation

## 2021-12-03 NOTE — Progress Notes (Signed)
Supervised Weight Loss Visit Bariatric Nutrition Education  Sleeve Gastrecotmy  NUTRITION ASSESSMENT  2 of 2 SWL visits  Anthropometrics  Start weight at [facility]: 412 lbs (date: 08/13/2021) Today's weight: 421 lbs BMI: 58.72 kg/m2    Clinical  Medical hx: HTN, sleep apnea, GERD, T2DM, bells palsy Medications: tresiba, metformin   Labs: calcium 10.6 Notable signs/symptoms: back aches, hip spur, walks with a cane Any previous deficiencies? No  Lifestyle & Dietary Hx  Pt states he got a recumbent bike stating it is going well and he likes it.  Pt states his blood sugars have been about 125-145.   Pt states he feels his mental health is much better than before and feel she has a better understanding. Pt states he does go to [facility] stating is really great for him stating he gained weight froin depression and has been working on it and feeling better about everything.   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    Estimated daily fluid intake:  oz Supplements:  Current average weekly physical activity:   24-Hr Dietary Recall First Meal: almond milk + banana + blueberries or 2 packets oatmeal + smoothie: broccoli, carrots, 2 banana, almond milk Snack:  Second Meal: rotisserie chicken salad sandwich on rye or grilled cheese Snack 3pm: 2 packets of oatmeal + milk Third Meal: 1.5 cans tuna fish sandwich on a sub + 2 bananas or salmon rice and onion or oxe tails with rice and greens Snack:  Beverages: water, diet coke  Estimated Energy Needs Calories: 1800   NUTRITION DIAGNOSIS  Overweight/obesity ([place]-3.3) related to past poor dietary habits and physical inactivity as evidenced by patient w/ planned sleeve gastrectomy surgery following dietary guidelines for continued weight loss.   NUTRITION INTERVENTION  Nutrition counseling (C-1) and education (E-2) to facilitate bariatric surgery goals.  Pre-Op Goals Progress  & New Goals Continue: talk to your doctor about starting PT Continue: cut portions in half NEW: limit to 1 banana and 1 packet of oatmeal   Learning Style & Readiness for Change Teaching method utilized: Visual & Auditory  Demonstrated degree of understanding via: Teach Back  Readiness Level: pre-contemplative  Barriers to learning/adherence to lifestyle change: unidentified  RD's Notes for next Visit  Assess pts adherence to chosen goals   MONITORING & EVALUATION Dietary intake, weekly physical activity, body weight, and pre-op goals  Next Steps  Patient is to return to [facility] for pre-op class

## 2021-12-22 HISTORY — PX: LAPAROSCOPIC GASTRIC SLEEVE RESECTION: SHX5895

## 2021-12-27 ENCOUNTER — Ambulatory Visit: Payer: Medicare HMO | Admitting: Podiatry

## 2022-01-06 ENCOUNTER — Other Ambulatory Visit: Payer: Self-pay

## 2022-01-06 ENCOUNTER — Encounter: Payer: 59 | Attending: Surgery | Admitting: Skilled Nursing Facility1

## 2022-01-06 DIAGNOSIS — E1169 Type 2 diabetes mellitus with other specified complication: Secondary | ICD-10-CM | POA: Diagnosis not present

## 2022-01-06 NOTE — Progress Notes (Signed)
Pre-Operative Nutrition Class:    Patient was seen on 01/06/2022 for Pre-Operative Bariatric Surgery Education at [facility].    Surgery date: 01/28/2022 Surgery type: sleeve Start weight at [facility]: 412 Weight today: 427.7  Samples given per MNT protocol. Patient educated on appropriate usage: Ensure max exp: February 19, 2022 Ensure max lot: [id number]  Chewable bariatric advantage: advanced multi EA exp: 08/23 Chewable bariatric advantage: advanced multi EA lot: [id number]  Bariatric advantage calcium citrate exp: 02/23 Bariatric advantage calcium citrate lot: [id number]   The following the learning objectives were met by the patient during this course: Identify Pre-Op Dietary Goals and will begin 2 weeks pre-operatively Identify appropriate sources of fluids and proteins  State protein recommendations and appropriate sources pre and post-operatively Identify Post-Operative Dietary Goals and will follow for 2 weeks post-operatively Identify appropriate multivitamin and calcium sources Describe the need for physical activity post-operatively and will follow MD recommendations State when to call healthcare provider regarding medication questions or post-operative complications When having a diagnosis of diabetes understanding hypoglycemia symptoms and the inclusion of 1 complex carbohydrate per meal  Handouts given during class include: Pre-Op Bariatric Surgery Diet Handout Protein Shake Handout Post-Op Bariatric Surgery Nutrition Handout BELT Program Information Flyer Support Group Information Flyer [facility] Bariatric Supplements Price List  Follow-Up Plan: Patient will follow-up at [facility] 2 weeks post operatively for diet advancement per MD.

## 2022-01-07 NOTE — Progress Notes (Signed)
Sent message, via epic in basket, requesting orders in epic from surgeon.  

## 2022-01-13 DIAGNOSIS — Z01818 Encounter for other preprocedural examination: Secondary | ICD-10-CM | POA: Diagnosis not present

## 2022-01-13 DIAGNOSIS — F411 Generalized anxiety disorder: Secondary | ICD-10-CM | POA: Diagnosis not present

## 2022-01-14 ENCOUNTER — Encounter: Payer: Self-pay | Admitting: Podiatry

## 2022-01-14 ENCOUNTER — Other Ambulatory Visit: Payer: Self-pay

## 2022-01-14 ENCOUNTER — Ambulatory Visit: Payer: Medicare HMO | Admitting: Podiatry

## 2022-01-14 DIAGNOSIS — G8929 Other chronic pain: Secondary | ICD-10-CM | POA: Insufficient documentation

## 2022-01-14 DIAGNOSIS — E1142 Type 2 diabetes mellitus with diabetic polyneuropathy: Secondary | ICD-10-CM

## 2022-01-14 DIAGNOSIS — E119 Type 2 diabetes mellitus without complications: Secondary | ICD-10-CM

## 2022-01-14 DIAGNOSIS — M2142 Flat foot [pes planus] (acquired), left foot: Secondary | ICD-10-CM

## 2022-01-14 DIAGNOSIS — B353 Tinea pedis: Secondary | ICD-10-CM

## 2022-01-14 DIAGNOSIS — F419 Anxiety disorder, unspecified: Secondary | ICD-10-CM | POA: Insufficient documentation

## 2022-01-14 DIAGNOSIS — B351 Tinea unguium: Secondary | ICD-10-CM

## 2022-01-14 DIAGNOSIS — M2141 Flat foot [pes planus] (acquired), right foot: Secondary | ICD-10-CM

## 2022-01-14 DIAGNOSIS — M79675 Pain in left toe(s): Secondary | ICD-10-CM | POA: Diagnosis not present

## 2022-01-14 DIAGNOSIS — M79674 Pain in right toe(s): Secondary | ICD-10-CM | POA: Diagnosis not present

## 2022-01-14 MED ORDER — KETOCONAZOLE 2 % EX CREA: TOPICAL_CREAM | CUTANEOUS | 1 refills | Status: DC

## 2022-01-14 NOTE — Progress Notes (Signed)
ANNUAL DIABETIC FOOT EXAM  Subjective: Bryan Wells presents today for for annual diabetic foot examination.  Patient relates h/o diabetes.  Patient denies any h/o foot wounds.  Patient denies any numbness, tingling, burning, or pins/needle sensation in feet.  Patient's blood sugar was 128 mg/dl today.   Risk factors: diabetes, dyslipidemia, HTN.  Georgann Housekeeper, MD is patient's PCP. Last visit was 01/13/2021.  Past Medical History:  Diagnosis Date   AAA (abdominal aortic aneurysm)    followed by vascular -- dr Jenne Pane-- infrarenal AAA and proximal descending thoracic    Anxiety    Bell's palsy 05/2017   left side-- per pt residual mild stiffness   Depression    GERD (gastroesophageal reflux disease)    History of aortic dissection 09/2008   type B  s/p  stenting BMS's distal descending thoracic into bilateral iliac arteries   History of MI (myocardial infarction)    per vascular note in epic,  post op complicattion aortic/ iliac stenting MI with tracheostomy for prolonged vent.   Hyperlipidemia    hypercholesterolemia   Hypertension    Morbid obesity with BMI of 50.0-59.9, adult (HCC)    OSA on CPAP    Type 2 diabetes mellitus treated with insulin (HCC)    followed by pcp   Wears dentures    upper   Wears glasses    Patient Active Problem List   Diagnosis Date Noted   Anxiety 01/14/2022   Chronic pain 01/14/2022   History of dissection of thoracic aorta 07/23/2021   Ruptured aneurysm of thoracoabdominal aorta 07/23/2021   Hypertension associated with diabetes (HCC) 07/23/2021   Aortic atherosclerosis (HCC) 07/23/2021   Coronary artery calcification 07/23/2021   Dissection of thoracic aorta 06/13/2021   Dyslipidemia 06/13/2021   Dysphagia 06/13/2021   Gastro-esophageal reflux disease without esophagitis 06/13/2021   Long term (current) use of insulin (HCC) 06/13/2021   Osteoarthritis of knee 06/13/2021   Pure hypercholesterolemia 06/13/2021   Thoracic aortic  aneurysm without rupture 06/13/2021   Type 2 diabetes mellitus with other specified complication (HCC) 06/13/2021   History of Bell's palsy 02/28/2020   Sudden left hearing loss 02/28/2020   Tinnitus of left ear 02/28/2020   Noncompliance with diabetes treatment 10/02/2018   Bell's palsy 08/21/2017   Hyperlipidemia 08/21/2017   Morbid obesity with BMI of 50.0-59.9, adult (HCC) 08/21/2017   Sleep apnea 08/21/2017   Type 2 diabetes mellitus with hyperglycemia (HCC) 08/21/2017   SINUSITIS, CHRONIC 12/11/2009   ERECTILE DYSFUNCTION, ORGANIC 12/11/2009   MORBID OBESITY 12/10/2009   DEPRESSION 12/10/2009   ULNAR NERVE ENTRAPMENT 12/10/2009   AORTIC ANEURYSM 12/10/2009   HYPERTENSION NEC 12/10/2009   History of AAA (abdominal aortic aneurysm) repair 12/10/2009   Hypertension 12/10/2009   Past Surgical History:  Procedure Laterality Date   COLONOSCOPY WITH PROPOFOL N/A 02/22/2019   Procedure: COLONOSCOPY WITH PROPOFOL;  Surgeon: Graylin Shiver, MD;  Location: [facility];  Service: Endoscopy;  Laterality: N/A;   DESCENDING AORTIC ANEURYSM REPAIR W/ STENT  10/ 2009  in [place]   BMS x2 distal portion extending into bilateral common iliac arteries   ESOPHAGOGASTRODUODENOSCOPY (EGD) WITH PROPOFOL N/A 02/22/2019   Procedure: ESOPHAGOGASTRODUODENOSCOPY (EGD) WITH PROPOFOL;  Surgeon: Graylin Shiver, MD;  Location: [facility];  Service: Endoscopy;  Laterality: N/A;   Current Outpatient Medications on File Prior to Visit  Medication Sig Dispense Refill   hydrOXYzine (ATARAX) 25 MG tablet Take 25 mg by mouth daily.     Accu-Chek Softclix Lancets lancets daily.  acetaminophen (TYLENOL) 500 MG tablet Take 500 mg by mouth every 6 (six) hours as needed for moderate pain.     aspirin EC 81 MG tablet Take 81 mg by mouth daily.     atorvastatin (LIPITOR) 10 MG tablet Take 10 mg by mouth daily.     bismuth subsalicylate (KAOPECTATE) 262 MG/15ML suspension Take 30 mLs by mouth every 6 (six) hours as  needed for indigestion.     clotrimazole-betamethasone (LOTRISONE) cream Apply 1 application topically 2 (two) times daily as needed (for skin fold/skin irritation.).   0   dapagliflozin propanediol (FARXIGA) 10 MG TABS tablet Take 10 mg by mouth daily.     diclofenac Sodium (VOLTAREN) 1 % GEL Apply 1 application topically 4 (four) times daily as needed (pain).     diltiazem (CARDIZEM CD) 240 MG 24 hr capsule Take 240 mg by mouth at bedtime.      furosemide (LASIX) 40 MG tablet Take 40 mg by mouth daily.      glimepiride (AMARYL) 2 MG tablet Take 2 mg by mouth daily with breakfast.     glucose blood test strip use to check blood sugars     ibuprofen (ADVIL,MOTRIN) 200 MG tablet Take 200 mg by mouth every 6 (six) hours as needed for mild pain (pain.).     insulin degludec (TRESIBA FLEXTOUCH) 200 UNIT/ML FlexTouch Pen Inject 40 Units into the skin daily.     Insulin Pen Needle (BD PEN NEEDLE MICRO U/F) 32G X 6 MM MISC See admin instructions.     ketorolac (TORADOL) 30 MG/ML injection Inject 30 mg into the muscle See admin instructions. Every 6 months as needed for arthritis     labetalol (NORMODYNE) 300 MG tablet Take 300 mg by mouth 2 (two) times daily.     meloxicam (MOBIC) 15 MG tablet Take 15 mg by mouth daily.     metFORMIN (GLUCOPHAGE) 1000 MG tablet Take 500 mg by mouth 2 (two) times daily.   1   Needle, Disp, 32G X 5/16" MISC See admin instructions.     NONFORMULARY OR COMPOUNDED ITEM Antifungal solution: Terbinafine 3%, Fluconazole 2%, Tea Tree Oil 5%, Urea 10%, Ibuprofen 2% in DMSO suspension #76mL (Patient taking differently: as needed. Antifungal solution: Terbinafine 3%, Fluconazole 2%, Tea Tree Oil 5%, Urea 10%, Ibuprofen 2% in DMSO suspension #71mL) 1 each 3   pantoprazole (PROTONIX) 40 MG tablet Take 40 mg by mouth 2 (two) times daily.     tiZANidine (ZANAFLEX) 4 MG tablet Take 4 mg by mouth daily.     triamcinolone acetonide (KENALOG-40) 40 MG/ML injection Inject 1 mL into the  muscle See admin instructions. Every 6 months as needed for arthritis     No current facility-administered medications on file prior to visit.    Allergies  Allergen Reactions   Ciprofloxacin Hives   Hydrochlorothiazide Swelling   Ibuprofen     stomach upset   Lisinopril Swelling    angioedma   Penicillins Other (See Comments)    Loss of consciousness  Has patient had a PCN reaction causing immediate rash, facial/tongue/throat swelling, SOB or lightheadedness with hypotension: Yes Has patient had a PCN reaction causing severe rash involving mucus membranes or skin necrosis: No Has patient had a PCN reaction that required hospitalization: Unknown Has patient had a PCN reaction occurring within the last 10 years: No If all of the above answers are "NO", then may proceed with Cephalosporin use.  Other reaction(s): hives   Strawberry (Diagnostic) Itching  Social History   Occupational History   Not on file  Tobacco Use   Smoking status: Former    Years: 20.00    Types: Cigarettes    Quit date: 02/21/2012    Years since quitting: 9.9   Smokeless tobacco: Never  Vaping Use   Vaping Use: Never used  Substance and Sexual Activity   Alcohol use: No   Drug use: Never   Sexual activity: Not on file   Family History  Problem Relation Age of Onset   Diabetes Father    Hyperlipidemia Sister    Hypertension Sister    Obesity Sister    Coronary artery disease Sister    Hyperlipidemia Brother    Immunization History  Administered Date(s) Administered   Influenza Whole 12/11/2009   Influenza,inj,quad, With Preservative 09/30/2017   Pneumococcal Polysaccharide-23 12/11/2009   Td 12/11/2009     Review of Systems: Negative except as noted in the HPI.   Objective: There were no vitals filed for this visit.  Shanna CiscoKevin Brant is a pleasant 61 y.o. male in NAD. AAO X 3.  Vascular Examination: CFT <3 seconds b/l LE. Palpable DP/PT pulses b/l LE. Digital hair absent b/l. Skin  temperature gradient WNL b/l. No pain with calf compression b/l. No edema noted b/l. No cyanosis or clubbing noted b/l LE.  Dermatological Examination: Pedal integument with normal turgor, texture and tone b/l LE. No open wounds b/l. No interdigital macerations b/l. Toenails 1-5 b/l elongated, thickened, discolored with subungual debris. +Tenderness with dorsal palpation of nailplates. No hyperkeratotic or porokeratotic lesions present. Diffuse scaling noted peripherally and plantarly b/l feet.  No interdigital macerations.  No blisters, no weeping. No signs of secondary bacterial infection noted.  Musculoskeletal Examination: Muscle strength 5/5 to all lower extremity muscle groups bilaterally. No pain, crepitus or joint limitation noted with ROM bilateral LE. No gross bony deformities bilaterally. Pes planus deformity noted bilateral LE.  Footwear Assessment: Does the patient wear appropriate shoes? Yes. Does the patient need inserts/orthotics? Yes.  Neurological Examination: Protective sensation intact 5/5 intact bilaterally with 10g monofilament b/l. Vibratory sensation intact b/l. Proprioception intact bilaterally.  Assessment: 1. Pain due to onychomycosis of toenails of both feet   2. Tinea pedis of both feet   3. Pes planus of both feet   4. Diabetic peripheral neuropathy associated with type 2 diabetes mellitus (HCC)   5. Encounter for diabetic foot exam (HCC)     ADA Risk Categorization: Low Risk :  Patient has all of the following: Intact protective sensation No prior foot ulcer  No severe deformity Pedal pulses present  Plan: -Diabetic foot examination performed today. -Continue foot and shoe inspections daily. Monitor blood glucose per PCP/Endocrinologist's recommendations. -Mycotic toenails 1-5 bilaterally were debrided in length and girth with sterile nail nippers and dremel without incident. -For tinea pedis, Rx sent to pharmacy for Ketoconazole Cream 2% to be applied  once daily for six weeks. -Patient/POA to call should there be question/concern in the interim.  Return in about 3 months (around 04/14/2022).  Freddie BreechJennifer L Kodie Kishi, DPM

## 2022-01-20 ENCOUNTER — Encounter (HOSPITAL_COMMUNITY)
Admission: RE | Admit: 2022-01-20 | Discharge: 2022-01-20 | Disposition: A | Discharge status: Home / Self Care | Payer: 59 | Source: Ambulatory Visit | Attending: Surgery | Admitting: Surgery

## 2022-01-20 ENCOUNTER — Encounter (HOSPITAL_COMMUNITY): Payer: Self-pay

## 2022-01-20 ENCOUNTER — Other Ambulatory Visit: Payer: Self-pay

## 2022-01-20 VITALS — BP 162/79 | HR 87 | Temp 98.6°F | Resp 16 | Ht 72.0 in

## 2022-01-20 DIAGNOSIS — E1159 Type 2 diabetes mellitus with other circulatory complications: Secondary | ICD-10-CM | POA: Insufficient documentation

## 2022-01-20 DIAGNOSIS — Z01818 Encounter for other preprocedural examination: Secondary | ICD-10-CM

## 2022-01-20 DIAGNOSIS — G4733 Obstructive sleep apnea (adult) (pediatric): Secondary | ICD-10-CM | POA: Insufficient documentation

## 2022-01-20 DIAGNOSIS — I152 Hypertension secondary to endocrine disorders: Secondary | ICD-10-CM | POA: Diagnosis not present

## 2022-01-20 DIAGNOSIS — Z794 Long term (current) use of insulin: Secondary | ICD-10-CM | POA: Insufficient documentation

## 2022-01-20 DIAGNOSIS — I716 Thoracoabdominal aortic aneurysm, without rupture, unspecified: Secondary | ICD-10-CM | POA: Insufficient documentation

## 2022-01-20 DIAGNOSIS — Z01812 Encounter for preprocedural laboratory examination: Secondary | ICD-10-CM | POA: Diagnosis not present

## 2022-01-20 DIAGNOSIS — Z87891 Personal history of nicotine dependence: Secondary | ICD-10-CM | POA: Diagnosis not present

## 2022-01-20 DIAGNOSIS — I2510 Atherosclerotic heart disease of native coronary artery without angina pectoris: Secondary | ICD-10-CM | POA: Diagnosis not present

## 2022-01-20 DIAGNOSIS — Z6841 Body Mass Index (BMI) 40.0 and over, adult: Secondary | ICD-10-CM | POA: Diagnosis not present

## 2022-01-20 HISTORY — DX: Depression, unspecified: F32.A

## 2022-01-20 HISTORY — DX: Gastro-esophageal reflux disease without esophagitis: K21.9

## 2022-01-20 HISTORY — DX: Anxiety disorder, unspecified: F41.9

## 2022-01-20 LAB — BASIC METABOLIC PANEL
Anion gap: 10 (ref 5–15)
BUN: 15 mg/dL (ref 6–20)
CO2: 25 mmol/L (ref 22–32)
Calcium: 10.1 mg/dL (ref 8.9–10.3)
Chloride: 103 mmol/L (ref 98–111)
Creatinine, Ser: 1.05 mg/dL (ref 0.61–1.24)
GFR, Estimated: >60 mL/min (ref >60)
Glucose, Bld: 203 mg/dL — ABNORMAL HIGH (ref 70–99)
Potassium: 3.9 mmol/L (ref 3.5–5.1)
Sodium: 138 mmol/L (ref 135–145)

## 2022-01-20 LAB — CBC
HCT: 43.4 % (ref 39.0–52.0)
Hemoglobin: 13.9 g/dL (ref 13.0–17.0)
MCH: 28.6 pg (ref 26.0–34.0)
MCHC: 32 g/dL (ref 30.0–36.0)
MCV: 89.3 fL (ref 80.0–100.0)
Platelets: 223 10*3/uL (ref 150–400)
RBC: 4.86 MIL/uL (ref 4.22–5.81)
RDW: 14.4 % (ref 11.5–15.5)
WBC: 6.5 10*3/uL (ref 4.0–10.5)
nRBC: 0 % (ref 0.0–0.2)

## 2022-01-20 LAB — GLUCOSE, CAPILLARY: Glucose-Capillary: 212 mg/dL — ABNORMAL HIGH (ref 70–99)

## 2022-01-20 LAB — HEMOGLOBIN A1C
Hgb A1c MFr Bld: 9.1 % — ABNORMAL HIGH (ref 4.8–5.6)
Mean Plasma Glucose: 214.47 mg/dL

## 2022-01-20 NOTE — Progress Notes (Addendum)
Anesthesia Review:  PCP: DR Deforest Hoyles  Cardiologist : none  Chest x-ray : Ct angio chest- 07/18/21  EKG : 07/23/21  Echo : Stress test: Cardiac Cath :  Activity level: cannot do a flight of stairs without difficulty  Sleep Study/ CPAP : has cpap  Fasting Blood Sugar :      / Checks Blood Sugar -- times a day:   Blood Thinner/ Instructions /Last Dose: ASA / Instructions/ Last Dose :   81 mg Aspirin  Diabetes- type 2- checks glucose once daily  Hgba1c-01/20/22-  9.1 - routed to DR Hassell Done  Covid test on 2/3 at 0930am .spoke with Tonya in short stay pt to call number for covid test.  Go to vehicle to do covid testin due to pt size.  LVMM  for pt in regards to this.  Left call back number of [phone]    No orders at preop appt.  Requested on 01/14/22 and again on 01/20/22.

## 2022-01-20 NOTE — Progress Notes (Signed)
Covid test on 01/24/22 at 0930am . Come thru main entrance at [facility] long,  have a seat in the lobby on the right as you come thru the door.  Call [phone] and give them your name and let them [facility] you are here for covid testin                Bryan Wells  01/20/2022   Your procedure is scheduled on:  01/28/22   Report to [facility] Main  Entrance   Report to admitting at    [phone]     Call this number if you have problems the morning of surgery [phone]    Remember: Do not eat food , candy gum or mints :After Midnight. You may have clear liquids from midnight until __  0430am    CLEAR LIQUID DIET   Foods Allowed                                                                       Coffee and tea, regular and decaf                              Plain Jell-O any favor except red or purple                                            Fruit ices (not with fruit pulp)                                      Iced Popsicles                                     Carbonated beverages, regular and diet                                    Cranberry, grape and apple juices Sports drinks like Gatorade Lightly seasoned clear broth or consume(fat free) Sugar   _____________________________________________________________________    BRUSH YOUR TEETH MORNING OF SURGERY AND RINSE YOUR MOUTH OUT, NO CHEWING GUM CANDY OR MINTS.     Take these medicines the morning of surgery with A SIP OF WATER:  protonix   DO NOT TAKE ANY DIABETIC MEDICATIONS DAY OF YOUR SURGERY                               You may not have any metal on your body including hair pins and              piercings  Do not wear jewelry, make-up, lotions, powders or perfumes, deodorant             Do not wear nail polish on your fingernails.  Do not shave  48 hours prior to surgery.  Men may shave face and neck.   Do not bring valuables to the hospital. [place].  Contacts, dentures or bridgework may not be worn into surgery.  Leave suitcase in the car. After surgery it may be brought to your room.     Patients discharged the day of surgery will not be allowed to drive home. IF YOU ARE HAVING SURGERY AND GOING HOME THE SAME DAY, YOU MUST HAVE AN ADULT TO DRIVE YOU HOME AND BE WITH YOU FOR 24 HOURS. YOU MAY GO HOME BY TAXI OR UBER OR ORTHERWISE, BUT AN ADULT MUST ACCOMPANY YOU HOME AND STAY WITH YOU FOR 24 HOURS.  Name and phone number of your driver:  Special Instructions: N/A              Please read over the following fact sheets you were given: _____________________________________________________________________  [facility] - Preparing for Surgery Before surgery, you can play an important role.  Because skin is not sterile, your skin needs to be as free of germs as possible.  You can reduce the number of germs on your skin by washing with CHG (chlorahexidine gluconate) soap before surgery.  CHG is an antiseptic cleaner which kills germs and bonds with the skin to continue killing germs even after washing. Please DO NOT use if you have an allergy to CHG or antibacterial soaps.  If your skin becomes reddened/irritated stop using the CHG and inform your nurse when you arrive at Short Stay. Do not shave (including legs and underarms) for at least 48 hours prior to the first CHG shower.  You may shave your face/neck. Please follow these instructions carefully:  1.  Shower with CHG Soap the night before surgery and the  morning of Surgery.  2.  If you choose to wash your hair, wash your hair first as usual with your  normal  shampoo.  3.  After you shampoo, rinse your hair and body thoroughly to remove the  shampoo.                           4.  Use CHG as you would any other liquid soap.  You can apply chg directly  to the skin and wash                       Gently with a scrungie or clean washcloth.  5.  Apply the CHG Soap to your body ONLY  FROM THE NECK DOWN.   Do not use on face/ open                           Wound or open sores. Avoid contact with eyes, ears mouth and genitals (private parts).                       Wash face,  Genitals (private parts) with your normal soap.             6.  Wash thoroughly, paying special attention to the area where your surgery  will be performed.  7.  Thoroughly rinse your body with warm water from the neck down.  8.  DO NOT shower/wash with your normal soap after using and rinsing off  the CHG Soap.  9.  Pat yourself dry with a clean towel.            10.  Wear clean pajamas.            11.  Place clean sheets on your bed the night of your first shower and do not  sleep with pets. Day of Surgery : Do not apply any lotions/deodorants the morning of surgery.  Please wear clean clothes to the hospital/surgery center.  FAILURE TO FOLLOW THESE INSTRUCTIONS MAY RESULT IN THE CANCELLATION OF YOUR SURGERY PATIENT SIGNATURE_________________________________  NURSE SIGNATURE__________________________________  ________________________________________________________________________

## 2022-01-21 NOTE — Progress Notes (Signed)
Anesthesia Chart Review   Case: [id number] Date/Time: 01/28/22 0715   Procedures:      XI ROBOTIC GASTRIC SLEEVE RESECTION     UPPER GI ENDOSCOPY   Anesthesia type: General   Pre-op diagnosis: MORBID OBESITY   Location: [place] 02 / [facility]   Surgeons: Johnathan Hausen, MD       DISCUSSION:61 y.o. former smoker with h/o HTN, OSA on CPAP, DM II, CAD, thoracoabdominal Aortic Aneurysm (Type B descending) with surgery in 2009  s/p stenting, morbid obesity scheduled for above procedure 01/28/2022 with Dr. Johnathan Hausen.   Pt last seen by cardiology 07/23/21. Per OV note, "Preoperative Risk Assessment - low moderate risk given prior dissection- has had no issues with this currently - DASI score of 19 associated with 5 functional mets - No further cardiac testing is recommended prior to surgery.  - The patient may proceed to surgery at acceptable risk.   - Will get CT Aorta; if stable we will discuss with Dr. [name] [phone]) that it would be reasonable to proceed to surgery - Our service is available as needed in the peri-operative period."  CT 07/29/2021, stable.   Anticipate pt can proceed with planned procedure barring acute status change.   VS: BP (!) 162/79    Pulse 87    Temp 37 C (Oral)    Resp 16    Ht 6' (1.829 m)    SpO2 97%    BMI 58.01 kg/m   PROVIDERS: Wenda Low, MD is PCP   Werner Lean, MD is Cardiologist  LABS: Labs reviewed: Acceptable for surgery. (all labs ordered are listed, but only abnormal results are displayed)  Labs Reviewed  BASIC METABOLIC PANEL - Abnormal; Notable for the following components:      Result Value   Glucose, Bld 203 (*)    All other components within normal limits  HEMOGLOBIN A1C - Abnormal; Notable for the following components:   Hgb A1c MFr Bld 9.1 (*)    All other components within normal limits  GLUCOSE, CAPILLARY - Abnormal; Notable for the following components:   Glucose-Capillary 212 (*)    All other  components within normal limits  CBC     IMAGES: CT Angio Abdomen 07/29/2021 IMPRESSION: Overall, there is a stable appearance of the type B aortic dissection extending from the origin of the left subclavian artery to the aortic bifurcation where there are patent bilateral common iliac artery stents. Stable sizes of the aneurysmal components of the descending thoracic aorta and infrarenal abdominal aorta measuring up to 4.8 cm and 4.7 cm, respectively. Stable size of the right common iliac artery aneurysm measuring up to 3.1 cm. No findings of malperfusion or other complicating features.  EKG: 07/23/21 Rate 89 bpm  SR  CV:  Past Medical History:  Diagnosis Date   AAA (abdominal aortic aneurysm)    followed by vascular -- dr Denzil Magnuson-- infrarenal AAA and proximal descending thoracic    Anxiety    Bell's palsy 05/2017   left side-- per pt residual mild stiffness   Depression    GERD (gastroesophageal reflux disease)    History of aortic dissection 09/2008   type B  s/p  stenting BMS's distal descending thoracic into bilateral iliac arteries   History of MI (myocardial infarction)    per vascular note in epic,  post op complicattion aortic/ iliac stenting MI with tracheostomy for prolonged vent.   Hyperlipidemia    hypercholesterolemia   Hypertension  Morbid obesity with BMI of 50.0-59.9, adult (HCC)    OSA on CPAP    Type 2 diabetes mellitus treated with insulin ([place])    followed by pcp   Wears dentures    upper   Wears glasses     Past Surgical History:  Procedure Laterality Date   COLONOSCOPY WITH PROPOFOL N/A 02/22/2019   Procedure: COLONOSCOPY WITH PROPOFOL;  Surgeon: Wonda Horner, MD;  Location: [facility];  Service: Endoscopy;  Laterality: N/A;   DESCENDING AORTIC ANEURYSM REPAIR W/ STENT  10/ 2009  in [place]   BMS x2 distal portion extending into bilateral common iliac arteries   ESOPHAGOGASTRODUODENOSCOPY (EGD) WITH PROPOFOL N/A 02/22/2019   Procedure:  ESOPHAGOGASTRODUODENOSCOPY (EGD) WITH PROPOFOL;  Surgeon: Wonda Horner, MD;  Location: [facility];  Service: Endoscopy;  Laterality: N/A;    MEDICATIONS:  Accu-Chek Softclix Lancets lancets   acetaminophen (TYLENOL) 500 MG tablet   aspirin EC 81 MG tablet   atorvastatin (LIPITOR) 10 MG tablet   bismuth subsalicylate (KAOPECTATE) 262 MG/15ML suspension   clotrimazole-betamethasone (LOTRISONE) cream   dapagliflozin propanediol (FARXIGA) 10 MG TABS tablet   diclofenac Sodium (VOLTAREN) 1 % GEL   diltiazem (CARDIZEM CD) 240 MG 24 hr capsule   furosemide (LASIX) 40 MG tablet   glimepiride (AMARYL) 2 MG tablet   glucose blood test strip   hydrOXYzine (ATARAX) 25 MG tablet   ibuprofen (ADVIL,MOTRIN) 200 MG tablet   insulin degludec (TRESIBA FLEXTOUCH) 200 UNIT/ML FlexTouch Pen   Insulin Pen Needle (BD PEN NEEDLE MICRO U/F) 32G X 6 MM MISC   ketoconazole (NIZORAL) 2 % cream   ketorolac (TORADOL) 30 MG/ML injection   labetalol (NORMODYNE) 300 MG tablet   meloxicam (MOBIC) 15 MG tablet   metFORMIN (GLUCOPHAGE) 1000 MG tablet   Needle, Disp, 32G X 5/16" MISC   NONFORMULARY OR COMPOUNDED ITEM   pantoprazole (PROTONIX) 40 MG tablet   tiZANidine (ZANAFLEX) 4 MG tablet   triamcinolone acetonide (KENALOG-40) 40 MG/ML injection   No current facility-administered medications for this encounter.      Konrad Felix Ward, PA-C [facility] Pre-Surgical Testing [phone]

## 2022-01-21 NOTE — Anesthesia Preprocedure Evaluation (Addendum)
Anesthesia Evaluation  Patient identified by MRN, date of birth, ID band Patient awake    Reviewed: Allergy & Precautions, NPO status , Patient's Chart, lab work & pertinent test results  Airway Mallampati: II  TM Distance: >3 FB Neck ROM: Full    Dental  (+) Dental Advisory Given, Missing   Pulmonary sleep apnea , former smoker,    Pulmonary exam normal breath sounds clear to auscultation       Cardiovascular hypertension, Pt. on home beta blockers and Pt. on medications + CAD  Normal cardiovascular exam Rhythm:Regular Rate:Normal     Neuro/Psych PSYCHIATRIC DISORDERS Anxiety Depression  Neuromuscular disease    GI/Hepatic Neg liver ROS, GERD  ,  Endo/Other  diabetes  Renal/GU negative Renal ROS     Musculoskeletal  (+) Arthritis ,   Abdominal   Peds  Hematology negative hematology ROS (+)   Anesthesia Other Findings   Reproductive/Obstetrics                                                            Anesthesia Evaluation  Patient identified by MRN, date of birth, ID band Patient awake    Reviewed: Allergy & Precautions, NPO status , Patient's Chart, lab work & pertinent test results  Airway Mallampati: II  TM Distance: >3 FB Neck ROM: Full    Dental no notable dental hx.    Pulmonary neg pulmonary ROS, sleep apnea , former smoker,    Pulmonary exam normal breath sounds clear to auscultation       Cardiovascular hypertension, Pt. on medications + Peripheral Vascular Disease  Normal cardiovascular exam Rhythm:Regular Rate:Normal     Neuro/Psych negative neurological ROS  negative psych ROS   GI/Hepatic negative GI ROS, Neg liver ROS,   Endo/Other  negative endocrine ROSdiabetesMorbid obesity  Renal/GU negative Renal ROS  negative genitourinary   Musculoskeletal negative musculoskeletal ROS (+)   Abdominal   Peds negative pediatric ROS (+)   Hematology negative hematology ROS (+)   Anesthesia Other Findings   Reproductive/Obstetrics negative OB ROS                             Anesthesia Physical Anesthesia Plan  ASA: III  Anesthesia Plan: MAC   Post-op Pain Management:    Induction:   PONV Risk Score and Plan: 1 and Treatment may vary due to age or medical condition  Airway Management Planned: Simple Face Mask and Nasal Cannula  Additional Equipment:   Intra-op Plan:   Post-operative Plan:   Informed Consent: I have reviewed the patients History and Physical, chart, labs and discussed the procedure including the risks, benefits and alternatives for the proposed anesthesia with the patient or authorized representative who has indicated his/her understanding and acceptance.     Dental advisory given  Plan Discussed with: CRNA  Anesthesia Plan Comments:         Anesthesia Quick Evaluation  Anesthesia Physical Anesthesia Plan  ASA: 3  Anesthesia Plan: General   Post-op Pain Management: Minimal or no pain anticipated, Tylenol PO (pre-op), Ketamine IV and Lidocaine infusion   Induction: Intravenous  PONV Risk Score and Plan: 4 or greater and Ondansetron, Treatment may vary due to age or medical condition, Dexamethasone, Midazolam, Scopolamine patch - Pre-op  and Amisulpride  Airway Management Planned: Oral ETT  Additional Equipment: None  Intra-op Plan:   Post-operative Plan: Extubation in OR  Informed Consent: I have reviewed the patients History and Physical, chart, labs and discussed the procedure including the risks, benefits and alternatives for the proposed anesthesia with the patient or authorized representative who has indicated his/her understanding and acceptance.     Dental advisory given  Plan Discussed with: CRNA  Anesthesia Plan Comments: (See PAT note 01/20/2022, Konrad Felix Ward, PA-C)     Anesthesia Quick Evaluation

## 2022-01-24 ENCOUNTER — Other Ambulatory Visit: Payer: Self-pay

## 2022-01-24 ENCOUNTER — Encounter (HOSPITAL_COMMUNITY)
Admission: RE | Admit: 2022-01-24 | Discharge: 2022-01-24 | Disposition: A | Discharge status: Home / Self Care | Payer: 59 | Source: Ambulatory Visit | Attending: Surgery | Admitting: Surgery

## 2022-01-24 DIAGNOSIS — Z20822 Contact with and (suspected) exposure to covid-19: Secondary | ICD-10-CM | POA: Diagnosis not present

## 2022-01-24 DIAGNOSIS — Z1152 Encounter for screening for COVID-19: Secondary | ICD-10-CM

## 2022-01-24 DIAGNOSIS — Z01812 Encounter for preprocedural laboratory examination: Secondary | ICD-10-CM | POA: Diagnosis not present

## 2022-01-24 LAB — SARS CORONAVIRUS 2 (TAT 6-24 HRS): SARS Coronavirus 2: NEGATIVE

## 2022-01-26 NOTE — H&P (Signed)
MRN: [id number] DOB: 1960-12-26 DATE OF ENCOUNTER: 11/27/2021 Subjective   Chief Complaint: Follow-up (Discuss surgery)   History of Present Illness: Bryan Wells is a 61 y.o. male who is seen today for preop for sleeve gastrectomy. His weight today is 421 and his height is 72 inches with a BMI of 57. He has a nutritional meeting in the next several days and then he should be ready to schedule for his robotic sleeve gastrectomy. He has never had any abdominal surgery. He has had a thoracic aneurysm repaired with a stent. 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  I described sleeve gastrectomy particularly with the robot to him in some detail. He is excited and ready to have this done. He does have reflux when he does not take any medications although his upper GI did not show a hiatal hernia or reflux. The study was limited somewhat by his weight and habitus. He does have difficulty getting around because of his weight on his knees. He walks with a cane. He may need some in hospital PT assistance.   His podiatrist I recommended him to me, Dr. Donzetta Matters.  Review of Systems: See HPI as well for other ROS.  ROS   Medical History: Past Medical History:  Diagnosis Date   Aneurysm (CMS-HCC)   Arthritis   Diabetes mellitus without complication (CMS-HCC)   GERD (gastroesophageal reflux disease)   Heart valve disease   Hyperlipidemia   Hypertension   Sleep apnea   Patient Active Problem List  Diagnosis   Bell's palsy   Coronary artery calcification   Dissection of thoracic aorta   Dyslipidemia   Erectile dysfunction associated with type 2 diabetes mellitus (CMS-HCC)   Gastro-esophageal reflux disease without esophagitis   History of AAA (abdominal aortic aneurysm) repair   Morbid obesity with BMI of 50.0-59.9, adult (CMS-HCC)   Past Surgical History:  Procedure Laterality Date   aneurysm stent    Allergies  Allergen  Reactions   Penicillins Hives and Other (See Comments)  Loss of consciousness  Has patient had a PCN reaction causing immediate rash, facial/tongue/throat swelling, SOB or lightheadedness with hypotension: Yes Has patient had a PCN reaction causing severe rash involving mucus membranes or skin necrosis: No Has patient had a PCN reaction that required hospitalization: Unknown Has patient had a PCN reaction occurring within the last 10 years: No If all of the above answers are "NO", then may proceed with Cephalosporin use. Loss of consciousness  Has patient had a PCN reaction causing immediate rash, facial/tongue/throat swelling, SOB or lightheadedness with hypotension: Yes Has patient had a PCN reaction causing severe rash involving mucus membranes or skin necrosis: No Has patient had a PCN reaction that required hospitalization: Unknown Has patient had a PCN reaction occurring within the last 10 years: No If all of the above answers are "NO", then may proceed with Cephalosporin use.   Current Outpatient Medications on File Prior to Visit  Medication Sig Dispense Refill   glimepiride (AMARYL) 2 MG tablet TAKE 1 TABLET BY MOUTH EVERY DAY WITH BREAKFAST OR THE FIRST MAIN MEAL OF THE DAY   insulin DEGLUDEC (TRESIBA FLEXTOUCH U-200) pen injector (concentration 200 units/mL) INJECT 40 units   labetaloL (TRANDATE) 300 MG tablet Take 300 mg by mouth 2 (two) times daily   metFORMIN (GLUCOPHAGE) 1000 MG tablet Take by mouth   pantoprazole (PROTONIX) 40 MG DR tablet Take 1 tablet by mouth 2 (two)  times daily   tiZANidine (ZANAFLEX) 4 MG tablet   No current facility-administered medications on file prior to visit.   Family History  Problem Relation Age of Onset   Diabetes Father   Obesity Sister   High blood pressure (Hypertension) Sister   Hyperlipidemia (Elevated cholesterol) Sister   Coronary Artery Disease (Blocked arteries around heart) Sister   Hyperlipidemia (Elevated cholesterol)  Brother    Social History   Tobacco Use  Smoking Status Former   Types: Cigarettes   Quit date: 2012   Years since quitting: 10.9  Smokeless Tobacco Never    Social History   Socioeconomic History   Marital status: Divorced  Tobacco Use   Smoking status: Former  Types: Cigarettes  Quit date: 2012  Years since quitting: 10.9   Smokeless tobacco: Never  Vaping Use   Vaping Use: Never used  Substance and Sexual Activity   Alcohol use: Never   Drug use: Never   Objective:   Vitals:  11/27/21 1328  Weight: (!) 190.9 kg (420 lb 12.8 oz)  Height: 182.9 cm (6')   Body mass index is 57.07 kg/m.  Physical Exam General: Obese African-American male HEENT: Unremarkable Chest : Clear to auscultation Heart: Sinus rhythm Breast: Not examined Abdomen: Obese with pannus GU not examined Rectal not examined Extremities full range of motion but with arthritic pain when bearing weight Neuro alert and oriented x3. Motor and sensory function grossly intact. Cognitive function is very good and he has a good fund of knowledge regarding this sleeve gastrectomy.  Labs, Imaging and Diagnostic Testing:  I reviewed his upper GI report  Assessment and Plan:  Diagnoses and all orders for this visit:  Morbid (severe) obesity due to excess calories (CMS-HCC)    I think he is good to go to schedule for a sleeve gastrectomy on the robot.  No follow-ups on file.  Bettie Capistran Charna Busman, MD

## 2022-01-28 ENCOUNTER — Encounter (HOSPITAL_COMMUNITY): Payer: Self-pay | Admitting: Surgery

## 2022-01-28 ENCOUNTER — Other Ambulatory Visit: Payer: Self-pay

## 2022-01-28 ENCOUNTER — Inpatient Hospital Stay (HOSPITAL_COMMUNITY): Payer: Medicare HMO | Admitting: Physician Assistant

## 2022-01-28 ENCOUNTER — Encounter (HOSPITAL_COMMUNITY)
Admission: RE | Disposition: A | Discharge status: Home / Self Care | Payer: Self-pay | Source: Ambulatory Visit | Attending: Surgery

## 2022-01-28 ENCOUNTER — Inpatient Hospital Stay (HOSPITAL_COMMUNITY): Payer: Medicare HMO | Admitting: Certified Registered Nurse Anesthetist

## 2022-01-28 ENCOUNTER — Inpatient Hospital Stay (HOSPITAL_COMMUNITY)
Admission: RE | Admit: 2022-01-28 | Discharge: 2022-01-30 | DRG: 621 | Disposition: A | Discharge status: Home / Self Care | Payer: Medicare HMO | Source: Ambulatory Visit | Attending: Surgery | Admitting: Surgery

## 2022-01-28 DIAGNOSIS — Z79899 Other long term (current) drug therapy: Secondary | ICD-10-CM

## 2022-01-28 DIAGNOSIS — Z9884 Bariatric surgery status: Secondary | ICD-10-CM

## 2022-01-28 DIAGNOSIS — E119 Type 2 diabetes mellitus without complications: Secondary | ICD-10-CM | POA: Diagnosis not present

## 2022-01-28 DIAGNOSIS — Z833 Family history of diabetes mellitus: Secondary | ICD-10-CM | POA: Diagnosis not present

## 2022-01-28 DIAGNOSIS — I10 Essential (primary) hypertension: Secondary | ICD-10-CM | POA: Diagnosis not present

## 2022-01-28 DIAGNOSIS — Z87891 Personal history of nicotine dependence: Secondary | ICD-10-CM

## 2022-01-28 DIAGNOSIS — Z881 Allergy status to other antibiotic agents status: Secondary | ICD-10-CM | POA: Diagnosis not present

## 2022-01-28 DIAGNOSIS — Z888 Allergy status to other drugs, medicaments and biological substances status: Secondary | ICD-10-CM | POA: Diagnosis not present

## 2022-01-28 DIAGNOSIS — G4733 Obstructive sleep apnea (adult) (pediatric): Secondary | ICD-10-CM | POA: Diagnosis not present

## 2022-01-28 DIAGNOSIS — Z7984 Long term (current) use of oral hypoglycemic drugs: Secondary | ICD-10-CM

## 2022-01-28 DIAGNOSIS — Z880 Allergy status to penicillin: Secondary | ICD-10-CM

## 2022-01-28 DIAGNOSIS — Z83438 Family history of other disorder of lipoprotein metabolism and other lipidemia: Secondary | ICD-10-CM

## 2022-01-28 DIAGNOSIS — Z8249 Family history of ischemic heart disease and other diseases of the circulatory system: Secondary | ICD-10-CM | POA: Diagnosis not present

## 2022-01-28 DIAGNOSIS — Z794 Long term (current) use of insulin: Secondary | ICD-10-CM | POA: Diagnosis not present

## 2022-01-28 DIAGNOSIS — K219 Gastro-esophageal reflux disease without esophagitis: Secondary | ICD-10-CM | POA: Diagnosis present

## 2022-01-28 DIAGNOSIS — Z886 Allergy status to analgesic agent status: Secondary | ICD-10-CM | POA: Diagnosis not present

## 2022-01-28 DIAGNOSIS — G4730 Sleep apnea, unspecified: Secondary | ICD-10-CM | POA: Diagnosis present

## 2022-01-28 DIAGNOSIS — I2510 Atherosclerotic heart disease of native coronary artery without angina pectoris: Secondary | ICD-10-CM | POA: Diagnosis present

## 2022-01-28 DIAGNOSIS — Z885 Allergy status to narcotic agent status: Secondary | ICD-10-CM | POA: Diagnosis not present

## 2022-01-28 DIAGNOSIS — E785 Hyperlipidemia, unspecified: Secondary | ICD-10-CM | POA: Diagnosis present

## 2022-01-28 DIAGNOSIS — Z6841 Body Mass Index (BMI) 40.0 and over, adult: Secondary | ICD-10-CM | POA: Diagnosis not present

## 2022-01-28 HISTORY — PX: UPPER GI ENDOSCOPY: SHX6162

## 2022-01-28 LAB — GLUCOSE, CAPILLARY
Glucose-Capillary: 173 mg/dL — ABNORMAL HIGH (ref 70–99)
Glucose-Capillary: 197 mg/dL — ABNORMAL HIGH (ref 70–99)
Glucose-Capillary: 200 mg/dL — ABNORMAL HIGH (ref 70–99)

## 2022-01-28 LAB — COMPREHENSIVE METABOLIC PANEL
ALT: 37 U/L (ref 0–44)
AST: 40 U/L (ref 15–41)
Albumin: 3.6 g/dL (ref 3.5–5.0)
Alkaline Phosphatase: 55 U/L (ref 38–126)
Anion gap: 11 (ref 5–15)
BUN: 15 mg/dL (ref 6–20)
CO2: 24 mmol/L (ref 22–32)
Calcium: 9.2 mg/dL (ref 8.9–10.3)
Chloride: 103 mmol/L (ref 98–111)
Creatinine, Ser: 0.89 mg/dL (ref 0.61–1.24)
GFR, Estimated: >60 mL/min (ref >60)
Glucose, Bld: 153 mg/dL — ABNORMAL HIGH (ref 70–99)
Potassium: 3.4 mmol/L — ABNORMAL LOW (ref 3.5–5.1)
Sodium: 138 mmol/L (ref 135–145)
Total Bilirubin: 0.2 mg/dL — ABNORMAL LOW (ref 0.3–1.2)
Total Protein: 7.6 g/dL (ref 6.5–8.1)

## 2022-01-28 LAB — CBC
HCT: 43.9 % (ref 39.0–52.0)
Hemoglobin: 14.1 g/dL (ref 13.0–17.0)
MCH: 28.7 pg (ref 26.0–34.0)
MCHC: 32.1 g/dL (ref 30.0–36.0)
MCV: 89.2 fL (ref 80.0–100.0)
Platelets: 229 10*3/uL (ref 150–400)
RBC: 4.92 MIL/uL (ref 4.22–5.81)
RDW: 14.2 % (ref 11.5–15.5)
WBC: 8.4 10*3/uL (ref 4.0–10.5)
nRBC: 0 % (ref 0.0–0.2)

## 2022-01-28 LAB — TYPE AND SCREEN
ABO/RH(D): B POS
Antibody Screen: NEGATIVE

## 2022-01-28 LAB — ABO/RH: ABO/RH(D): B POS

## 2022-01-28 LAB — CREATININE, SERUM
Creatinine, Ser: 0.96 mg/dL (ref 0.61–1.24)
GFR, Estimated: >60 mL/min (ref >60)

## 2022-01-28 SURGERY — XI ROBOTIC GASTRIC SLEEVE RESECTION
Anesthesia: General | Site: Abdomen

## 2022-01-28 MED ORDER — MEPERIDINE HCL 50 MG/ML IJ SOLN: 6.2500 mg | INTRAMUSCULAR | Status: DC | PRN

## 2022-01-28 MED ORDER — ONDANSETRON HCL 4 MG/2ML IJ SOLN
INTRAMUSCULAR | Status: AC
  Filled 2022-01-28: qty 2

## 2022-01-28 MED ORDER — BUPIVACAINE LIPOSOME 1.3 % IJ SUSP
INTRAMUSCULAR | Status: DC | PRN
  Administered 2022-01-28: 20 mL

## 2022-01-28 MED ORDER — KETAMINE HCL 10 MG/ML IJ SOLN
INTRAMUSCULAR | Status: DC | PRN
  Administered 2022-01-28: 30 mg via INTRAVENOUS

## 2022-01-28 MED ORDER — KETAMINE HCL 10 MG/ML IJ SOLN
INTRAMUSCULAR | Status: AC
  Filled 2022-01-28: qty 1

## 2022-01-28 MED ORDER — PHENYLEPHRINE HCL-NACL 20-0.9 MG/250ML-% IV SOLN
INTRAVENOUS | Status: AC
  Filled 2022-01-28: qty 500

## 2022-01-28 MED ORDER — SCOPOLAMINE 1 MG/3DAYS TD PT72
1.0000 | MEDICATED_PATCH | TRANSDERMAL | Status: DC
  Administered 2022-01-28: 1.5 mg via TRANSDERMAL
  Filled 2022-01-28: qty 1

## 2022-01-28 MED ORDER — CHLORHEXIDINE GLUCONATE 0.12 % MT SOLN
15.0000 mL | Freq: Once | OROMUCOSAL | Status: AC
  Administered 2022-01-28: 15 mL via OROMUCOSAL

## 2022-01-28 MED ORDER — MIDAZOLAM HCL 2 MG/2ML IJ SOLN
INTRAMUSCULAR | Status: AC
  Filled 2022-01-28: qty 2

## 2022-01-28 MED ORDER — ROCURONIUM BROMIDE 10 MG/ML (PF) SYRINGE
PREFILLED_SYRINGE | INTRAVENOUS | Status: DC | PRN
  Administered 2022-01-28: 20 mg via INTRAVENOUS
  Administered 2022-01-28: 80 mg via INTRAVENOUS
  Administered 2022-01-28: 20 mg via INTRAVENOUS
  Administered 2022-01-28: 40 mg via INTRAVENOUS

## 2022-01-28 MED ORDER — PHENYLEPHRINE HCL-NACL 20-0.9 MG/250ML-% IV SOLN
INTRAVENOUS | Status: DC | PRN
  Administered 2022-01-28: 20 ug/min via INTRAVENOUS

## 2022-01-28 MED ORDER — MORPHINE SULFATE (PF) 2 MG/ML IV SOLN
1.0000 mg | INTRAVENOUS | Status: DC | PRN
  Administered 2022-01-28 – 2022-01-29 (×2): 2 mg via INTRAVENOUS
  Filled 2022-01-28 (×2): qty 1

## 2022-01-28 MED ORDER — FENTANYL CITRATE (PF) 100 MCG/2ML IJ SOLN
INTRAMUSCULAR | Status: DC | PRN
  Administered 2022-01-28 (×5): 50 ug via INTRAVENOUS

## 2022-01-28 MED ORDER — ACETAMINOPHEN 160 MG/5ML PO SOLN
1000.0000 mg | Freq: Three times a day (TID) | ORAL | Status: DC
  Administered 2022-01-28 – 2022-01-29 (×3): 1000 mg via ORAL
  Filled 2022-01-28 (×3): qty 40.6

## 2022-01-28 MED ORDER — ACETAMINOPHEN 500 MG PO TABS
1000.0000 mg | ORAL_TABLET | ORAL | Status: AC
  Administered 2022-01-28: 1000 mg via ORAL
  Filled 2022-01-28: qty 2

## 2022-01-28 MED ORDER — DEXAMETHASONE SODIUM PHOSPHATE 10 MG/ML IJ SOLN
INTRAMUSCULAR | Status: AC
  Filled 2022-01-28: qty 1

## 2022-01-28 MED ORDER — PROMETHAZINE HCL 25 MG/ML IJ SOLN
INTRAMUSCULAR | Status: AC
  Filled 2022-01-28: qty 1

## 2022-01-28 MED ORDER — OXYCODONE HCL 5 MG/5ML PO SOLN
5.0000 mg | Freq: Four times a day (QID) | ORAL | Status: DC | PRN
  Administered 2022-01-28 – 2022-01-30 (×6): 5 mg via ORAL
  Filled 2022-01-28 (×7): qty 5

## 2022-01-28 MED ORDER — BUPIVACAINE LIPOSOME 1.3 % IJ SUSP
INTRAMUSCULAR | Status: AC
  Filled 2022-01-28: qty 20

## 2022-01-28 MED ORDER — CHLORHEXIDINE GLUCONATE CLOTH 2 % EX PADS: 6.0000 | MEDICATED_PAD | Freq: Once | CUTANEOUS | Status: DC

## 2022-01-28 MED ORDER — ROCURONIUM BROMIDE 10 MG/ML (PF) SYRINGE
PREFILLED_SYRINGE | INTRAVENOUS | Status: AC
  Filled 2022-01-28: qty 10

## 2022-01-28 MED ORDER — LIDOCAINE HCL (PF) 2 % IJ SOLN
INTRAMUSCULAR | Status: DC | PRN
  Administered 2022-01-28: 1 mg/kg/h via INTRADERMAL

## 2022-01-28 MED ORDER — ENSURE MAX PROTEIN PO LIQD
2.0000 [oz_av] | ORAL | Status: DC
  Administered 2022-01-29 – 2022-01-30 (×8): 2 [oz_av] via ORAL

## 2022-01-28 MED ORDER — SODIUM CHLORIDE 0.9 % IV SOLN
2.0000 g | INTRAVENOUS | Status: AC
  Administered 2022-01-28: 2 g via INTRAVENOUS
  Filled 2022-01-28: qty 2

## 2022-01-28 MED ORDER — AMISULPRIDE (ANTIEMETIC) 5 MG/2ML IV SOLN
INTRAVENOUS | Status: AC
  Filled 2022-01-28: qty 4

## 2022-01-28 MED ORDER — MIDAZOLAM HCL 5 MG/5ML IJ SOLN
INTRAMUSCULAR | Status: DC | PRN
  Administered 2022-01-28: 2 mg via INTRAVENOUS

## 2022-01-28 MED ORDER — BUPIVACAINE LIPOSOME 1.3 % IJ SUSP: 20.0000 mL | Freq: Once | INTRAMUSCULAR | Status: DC

## 2022-01-28 MED ORDER — 0.9 % SODIUM CHLORIDE (POUR BTL) OPTIME
TOPICAL | Status: DC | PRN
  Administered 2022-01-28: 1000 mL

## 2022-01-28 MED ORDER — ENOXAPARIN (LOVENOX) PATIENT EDUCATION KIT
PACK | Freq: Once | Status: DC
  Filled 2022-01-28: qty 1

## 2022-01-28 MED ORDER — CHLORHEXIDINE GLUCONATE CLOTH 2 % EX PADS
6.0000 | MEDICATED_PAD | Freq: Once | CUTANEOUS | Status: DC
Start: 2022-01-28 — End: 2022-01-28

## 2022-01-28 MED ORDER — PANTOPRAZOLE SODIUM 40 MG IV SOLR
40.0000 mg | Freq: Every day | INTRAVENOUS | Status: DC
  Administered 2022-01-28 – 2022-01-29 (×2): 40 mg via INTRAVENOUS
  Filled 2022-01-28 (×2): qty 10

## 2022-01-28 MED ORDER — SUGAMMADEX SODIUM 200 MG/2ML IV SOLN
INTRAVENOUS | Status: DC | PRN
  Administered 2022-01-28: 400 mg via INTRAVENOUS

## 2022-01-28 MED ORDER — ONDANSETRON HCL 4 MG/2ML IJ SOLN
4.0000 mg | INTRAMUSCULAR | Status: DC | PRN
  Administered 2022-01-29: 4 mg via INTRAVENOUS
  Filled 2022-01-28: qty 2

## 2022-01-28 MED ORDER — METOPROLOL TARTRATE 5 MG/5ML IV SOLN: 5.0000 mg | Freq: Four times a day (QID) | INTRAVENOUS | Status: DC | PRN

## 2022-01-28 MED ORDER — LACTATED RINGERS IR SOLN
Status: DC | PRN
  Administered 2022-01-28: 1000 mL

## 2022-01-28 MED ORDER — PROPOFOL 500 MG/50ML IV EMUL
INTRAVENOUS | Status: AC
  Filled 2022-01-28: qty 50

## 2022-01-28 MED ORDER — APREPITANT 40 MG PO CAPS
40.0000 mg | ORAL_CAPSULE | ORAL | Status: AC
  Administered 2022-01-28: 40 mg via ORAL
  Filled 2022-01-28: qty 1

## 2022-01-28 MED ORDER — AMISULPRIDE (ANTIEMETIC) 5 MG/2ML IV SOLN
10.0000 mg | Freq: Once | INTRAVENOUS | Status: AC | PRN
  Administered 2022-01-28: 10 mg via INTRAVENOUS

## 2022-01-28 MED ORDER — KCL IN DEXTROSE-NACL 20-5-0.45 MEQ/L-%-% IV SOLN
INTRAVENOUS | Status: DC
  Filled 2022-01-28 (×5): qty 1000

## 2022-01-28 MED ORDER — ONDANSETRON HCL 4 MG/2ML IJ SOLN
INTRAMUSCULAR | Status: DC | PRN
Start: 2022-01-28 — End: 2022-01-28
  Administered 2022-01-28: 4 mg via INTRAVENOUS

## 2022-01-28 MED ORDER — ACETAMINOPHEN 500 MG PO TABS
1000.0000 mg | ORAL_TABLET | Freq: Three times a day (TID) | ORAL | Status: DC
  Administered 2022-01-29 – 2022-01-30 (×4): 1000 mg via ORAL
  Filled 2022-01-28 (×5): qty 2

## 2022-01-28 MED ORDER — HEPARIN SODIUM (PORCINE) 5000 UNIT/ML IJ SOLN
5000.0000 [IU] | Freq: Three times a day (TID) | INTRAMUSCULAR | Status: DC
  Administered 2022-01-28 – 2022-01-30 (×6): 5000 [IU] via SUBCUTANEOUS
  Filled 2022-01-28 (×6): qty 1

## 2022-01-28 MED ORDER — GLYCOPYRROLATE 0.2 MG/ML IJ SOLN
INTRAMUSCULAR | Status: AC
  Filled 2022-01-28: qty 1

## 2022-01-28 MED ORDER — HYDROMORPHONE HCL 1 MG/ML IJ SOLN
0.2500 mg | INTRAMUSCULAR | Status: DC | PRN
  Administered 2022-01-28 (×6): 0.5 mg via INTRAVENOUS

## 2022-01-28 MED ORDER — LIDOCAINE HCL 2 % IJ SOLN
INTRAMUSCULAR | Status: AC
  Filled 2022-01-28: qty 20

## 2022-01-28 MED ORDER — DEXAMETHASONE SODIUM PHOSPHATE 4 MG/ML IJ SOLN
INTRAMUSCULAR | Status: DC | PRN
  Administered 2022-01-28: 5 mg via INTRAVENOUS

## 2022-01-28 MED ORDER — FENTANYL CITRATE (PF) 250 MCG/5ML IJ SOLN
INTRAMUSCULAR | Status: AC
  Filled 2022-01-28: qty 5

## 2022-01-28 MED ORDER — LACTATED RINGERS IV SOLN: INTRAVENOUS | Status: DC

## 2022-01-28 MED ORDER — ORAL CARE MOUTH RINSE: 15.0000 mL | Freq: Once | OROMUCOSAL | Status: AC

## 2022-01-28 MED ORDER — HYDROMORPHONE HCL 1 MG/ML IJ SOLN
INTRAMUSCULAR | Status: AC
  Filled 2022-01-28: qty 1

## 2022-01-28 MED ORDER — PROPOFOL 10 MG/ML IV BOLUS
INTRAVENOUS | Status: DC | PRN
  Administered 2022-01-28: 200 mg via INTRAVENOUS

## 2022-01-28 MED ORDER — SODIUM CHLORIDE (PF) 0.9 % IJ SOLN
INTRAMUSCULAR | Status: DC | PRN
  Administered 2022-01-28: 10 mL

## 2022-01-28 MED ORDER — HEPARIN SODIUM (PORCINE) 5000 UNIT/ML IJ SOLN
5000.0000 [IU] | INTRAMUSCULAR | Status: AC
  Administered 2022-01-28: 5000 [IU] via SUBCUTANEOUS
  Filled 2022-01-28: qty 1

## 2022-01-28 MED ORDER — HYDROMORPHONE HCL 1 MG/ML IJ SOLN
INTRAMUSCULAR | Status: AC
  Filled 2022-01-28: qty 2

## 2022-01-28 MED ORDER — PROMETHAZINE HCL 25 MG/ML IJ SOLN
6.2500 mg | INTRAMUSCULAR | Status: AC | PRN
  Administered 2022-01-28 (×2): 6.25 mg via INTRAVENOUS

## 2022-01-28 MED ORDER — SODIUM CHLORIDE (PF) 0.9 % IJ SOLN
INTRAMUSCULAR | Status: AC
  Filled 2022-01-28: qty 10

## 2022-01-28 SURGICAL SUPPLY — 67 items
APPLIER CLIP 5 13 M/L LIGAMAX5 (MISCELLANEOUS)
APPLIER CLIP ROT 10 11.4 M/L (STAPLE)
BLADE SURG 15 STRL LF DISP TIS (BLADE) ×2 IMPLANT
BLADE SURG 15 STRL SS (BLADE) ×3
CANNULA REDUC XI 12-8 STAPL (CANNULA) ×3
CANNULA REDUCER 12-8 DVNC XI (CANNULA) ×2 IMPLANT
CHLORAPREP W/TINT 26 (MISCELLANEOUS) ×3 IMPLANT
CLIP APPLIE 5 13 M/L LIGAMAX5 (MISCELLANEOUS) IMPLANT
CLIP APPLIE ROT 10 11.4 M/L (STAPLE) IMPLANT
COVER SURGICAL LIGHT HANDLE (MISCELLANEOUS) ×3 IMPLANT
DERMABOND ADVANCED (GAUZE/BANDAGES/DRESSINGS) ×1
DERMABOND ADVANCED .7 DNX12 (GAUZE/BANDAGES/DRESSINGS) ×2 IMPLANT
DRAPE ARM DVNC X/XI (DISPOSABLE) ×8 IMPLANT
DRAPE COLUMN DVNC XI (DISPOSABLE) ×2 IMPLANT
DRAPE DA VINCI XI ARM (DISPOSABLE) ×12
DRAPE DA VINCI XI COLUMN (DISPOSABLE) ×3
ELECT REM PT RETURN 15FT ADLT (MISCELLANEOUS) ×3 IMPLANT
GLOVE SURG ENC MOIS LTX SZ8 (GLOVE) ×6 IMPLANT
GOWN STRL REUS W/TWL XL LVL3 (GOWN DISPOSABLE) ×9 IMPLANT
GRASPER SUT TROCAR 14GX15 (MISCELLANEOUS) ×3 IMPLANT
IRRIG SUCT STRYKERFLOW 2 WTIP (MISCELLANEOUS) ×3
IRRIGATION SUCT STRKRFLW 2 WTP (MISCELLANEOUS) ×2 IMPLANT
KIT BASIN OR (CUSTOM PROCEDURE TRAY) ×3 IMPLANT
KIT TURNOVER KIT A (KITS) ×1 IMPLANT
LUBRICANT JELLY K Y 4OZ (MISCELLANEOUS) IMPLANT
MARKER SKIN DUAL TIP RULER LAB (MISCELLANEOUS) ×3 IMPLANT
MAT PREVALON FULL STRYKER (MISCELLANEOUS) ×3 IMPLANT
NDL SPNL 22GX3.5 QUINCKE BK (NEEDLE) ×2 IMPLANT
NEEDLE SPNL 22GX3.5 QUINCKE BK (NEEDLE) ×3 IMPLANT
OBTURATOR OPTICAL STANDARD 8MM (TROCAR) ×3
OBTURATOR OPTICAL STND 8 DVNC (TROCAR) ×2
OBTURATOR OPTICALSTD 8 DVNC (TROCAR) ×2 IMPLANT
PACK CARDIOVASCULAR III (CUSTOM PROCEDURE TRAY) ×3 IMPLANT
RELOAD STAPLE 60 2.5 WHT DVNC (STAPLE) IMPLANT
RELOAD STAPLE 60 3.5 BLU DVNC (STAPLE) IMPLANT
RELOAD STAPLER 2.5X60 WHT DVNC (STAPLE) ×10 IMPLANT
RELOAD STAPLER 3.5X60 BLU DVNC (STAPLE) ×4 IMPLANT
SCISSORS LAP 5X35 DISP (ENDOMECHANICALS) IMPLANT
SEAL CANN UNIV 5-8 DVNC XI (MISCELLANEOUS) ×6 IMPLANT
SEAL XI 5MM-8MM UNIVERSAL (MISCELLANEOUS) ×9
SEALER VESSEL DA VINCI XI (MISCELLANEOUS) ×3
SEALER VESSEL EXT DVNC XI (MISCELLANEOUS) ×2 IMPLANT
SLEEVE GASTRECTOMY 36FR VISIGI (MISCELLANEOUS) ×3 IMPLANT
SOL ANTI FOG 6CC (MISCELLANEOUS) ×2 IMPLANT
SOLUTION ANTI FOG 6CC (MISCELLANEOUS) ×1
SOLUTION ELECTROLUBE (MISCELLANEOUS) ×3 IMPLANT
SPIKE FLUID TRANSFER (MISCELLANEOUS) ×3 IMPLANT
SPONGE T-LAP 18X18 ~~LOC~~+RFID (SPONGE) ×3 IMPLANT
STAPLER 60 DA VINCI SURE FORM (STAPLE) ×3
STAPLER 60 SUREFORM DVNC (STAPLE) ×2 IMPLANT
STAPLER CANNULA SEAL DVNC XI (STAPLE) ×2 IMPLANT
STAPLER CANNULA SEAL XI (STAPLE) ×3
STAPLER RELOAD 2.5X60 WHITE (STAPLE) ×15
STAPLER RELOAD 2.5X60 WHT DVNC (STAPLE) ×10
STAPLER RELOAD 3.5X60 BLU DVNC (STAPLE) ×4
STAPLER RELOAD 3.5X60 BLUE (STAPLE) ×6
SUT ETHIBOND 0 36 GRN (SUTURE) IMPLANT
SUT MNCRL AB 4-0 PS2 18 (SUTURE) ×6 IMPLANT
SUT VICRYL 0 TIES 12 18 (SUTURE) ×3 IMPLANT
SYR 10ML ECCENTRIC (SYRINGE) IMPLANT
SYR 20ML LL LF (SYRINGE) ×3 IMPLANT
TOWEL OR 17X26 10 PK STRL BLUE (TOWEL DISPOSABLE) ×3 IMPLANT
TRAY FOLEY MTR SLVR 16FR STAT (SET/KITS/TRAYS/PACK) ×1 IMPLANT
TROCAR ADV FIXATION 5X100MM (TROCAR) IMPLANT
TROCAR BLADELESS OPT 5 100 (ENDOMECHANICALS) ×3 IMPLANT
TUBE CALIBRATION LAPBAND (TUBING) IMPLANT
TUBING INSUFFLATION 10FT LAP (TUBING) ×3 IMPLANT

## 2022-01-28 NOTE — Progress Notes (Signed)
PHARMACY CONSULT FOR:  Risk Assessment for Post-Discharge VTE Following Bariatric Surgery  Post-Discharge VTE Risk Assessment: This patient's probability of 30-day post-discharge VTE is increased due to the factors marked: x Sleeve gastrectomy   Liver disorder (transplant, cirrhosis, or nonalcoholic steatohepatitis)   Hx of VTE   Hemorrhage requiring transfusion   GI perforation, leak, or obstruction   ====================================================   x  Male  x Age >/=60 years   x BMI >/=50 kg/m2    CHF    Dyspnea at Rest    Paraplegia   x Non-gastric-band surgery    Operation Time >/=3 hr    Return to OR     Length of Stay >/= 3 d   Hypercoagulable condition   Significant venous stasis      Predicted probability of 30-day post-discharge VTE: 0.99%  Other patient-specific factors to consider:   Recommendation for Discharge: Enoxaparin 60 mg  q12h x 2 weeks post-discharge  Bryan Wells is a 61 y.o. male who underwent robotic sleeve gastrectomy and upper endoscopy on 01/28/2022   Case start: 0800 Case end: 0954   Allergies  Allergen Reactions   Ciprofloxacin Hives   Hydrochlorothiazide Swelling   Ibuprofen     stomach upset   Lisinopril Swelling    angioedma   Penicillins Other (See Comments)    Loss of consciousness  Has patient had a PCN reaction causing immediate rash, facial/tongue/throat swelling, SOB or lightheadedness with hypotension: Yes Has patient had a PCN reaction causing severe rash involving mucus membranes or skin necrosis: No Has patient had a PCN reaction that required hospitalization: Unknown Has patient had a PCN reaction occurring within the last 10 years: No If all of the above answers are "NO", then may proceed with Cephalosporin use.  Other reaction(s): hives   Strawberry (Diagnostic) Itching    Patient Measurements: Height: 6' (182.9 cm) Weight: (!) 191.9 kg (423 lb) IBW/kg (Calculated) : 77.6 Body mass index is 57.37  kg/m.  Recent Labs    01/28/22 0623  CREATININE 0.89  ALBUMIN 3.6  PROT 7.6  AST 40  ALT 37  ALKPHOS 55  BILITOT 0.2*   Estimated Creatinine Clearance: 153.9 mL/min (by C-G formula based on SCr of 0.89 mg/dL).    Past Medical History:  Diagnosis Date   AAA (abdominal aortic aneurysm)    followed by vascular -- dr Jenne Pane-- infrarenal AAA and proximal descending thoracic    Anxiety    Bell's palsy 05/2017   left side-- per pt residual mild stiffness   Depression    GERD (gastroesophageal reflux disease)    History of aortic dissection 09/2008   type B  s/p  stenting BMS's distal descending thoracic into bilateral iliac arteries   History of MI (myocardial infarction)    per vascular note in epic,  post op complicattion aortic/ iliac stenting MI with tracheostomy for prolonged vent.   Hyperlipidemia    hypercholesterolemia   Hypertension    Morbid obesity with BMI of 50.0-59.9, adult (HCC)    OSA on CPAP    Type 2 diabetes mellitus treated with insulin (HCC)    followed by pcp   Wears dentures    upper   Wears glasses      Medications Prior to Admission  Medication Sig Dispense Refill Last Dose   acetaminophen (TYLENOL) 500 MG tablet Take 500 mg by mouth every 6 (six) hours as needed for moderate pain.   01/27/2022   aspirin EC 81 MG tablet Take 81 mg by  mouth daily.   Past Month   atorvastatin (LIPITOR) 10 MG tablet Take 10 mg by mouth daily.   01/27/2022   bismuth subsalicylate (KAOPECTATE) 262 MG/15ML suspension Take 30 mLs by mouth every 6 (six) hours as needed for indigestion.   01/28/2022 at 0530   clotrimazole-betamethasone (LOTRISONE) cream Apply 1 application topically 2 (two) times daily as needed (for skin fold/skin irritation.).   0 Past Month   dapagliflozin propanediol (FARXIGA) 10 MG TABS tablet Take 10 mg by mouth daily.   01/27/2022   diclofenac Sodium (VOLTAREN) 1 % GEL Apply 1 application topically 4 (four) times daily as needed (pain).   Past Month    diltiazem (CARDIZEM CD) 240 MG 24 hr capsule Take 240 mg by mouth at bedtime.    01/27/2022   furosemide (LASIX) 40 MG tablet Take 40 mg by mouth daily.    01/27/2022   glimepiride (AMARYL) 2 MG tablet Take 2 mg by mouth daily with breakfast.   01/27/2022   hydrOXYzine (ATARAX) 25 MG tablet Take 25 mg by mouth daily.   01/27/2022   ibuprofen (ADVIL,MOTRIN) 200 MG tablet Take 200 mg by mouth every 6 (six) hours as needed for mild pain (pain.).   01/27/2022   insulin degludec (TRESIBA FLEXTOUCH) 200 UNIT/ML FlexTouch Pen Inject 40 Units into the skin daily.   01/27/2022   ketoconazole (NIZORAL) 2 % cream Apply to both feet and between toes once daily for 6 weeks. 60 g 1 Past Month   labetalol (NORMODYNE) 300 MG tablet Take 300 mg by mouth 2 (two) times daily.   01/28/2022 at 0300   meloxicam (MOBIC) 15 MG tablet Take 15 mg by mouth daily.   01/27/2022   metFORMIN (GLUCOPHAGE) 1000 MG tablet Take 500 mg by mouth 2 (two) times daily.   1 01/27/2022   NONFORMULARY OR COMPOUNDED ITEM Antifungal solution: Terbinafine 3%, Fluconazole 2%, Tea Tree Oil 5%, Urea 10%, Ibuprofen 2% in DMSO suspension #57mL (Patient taking differently: as needed. Antifungal solution: Terbinafine 3%, Fluconazole 2%, Tea Tree Oil 5%, Urea 10%, Ibuprofen 2% in DMSO suspension #29mL) 1 each 3 Past Month   pantoprazole (PROTONIX) 40 MG tablet Take 40 mg by mouth 2 (two) times daily.   01/27/2022   tiZANidine (ZANAFLEX) 4 MG tablet Take 4 mg by mouth daily.   01/27/2022   triamcinolone acetonide (KENALOG-40) 40 MG/ML injection Inject 1 mL into the muscle See admin instructions. Every 6 months as needed for arthritis   Past Month   Accu-Chek Softclix Lancets lancets daily.   supply   glucose blood test strip use to check blood sugars   supply   Insulin Pen Needle (BD PEN NEEDLE MICRO U/F) 32G X 6 MM MISC See admin instructions.   supply   ketorolac (TORADOL) 30 MG/ML injection Inject 30 mg into the muscle See admin instructions. Every 6 months as needed for  arthritis   Unknown   Needle, Disp, 32G X 5/16" MISC See admin instructions.   supply    Loralee Pacas, PharmD, BCPS Pharmacy: [phone] 01/28/2022,10:23 AM

## 2022-01-28 NOTE — Transfer of Care (Signed)
Immediate Anesthesia Transfer of Care Note  Patient: Bryan Wells  Procedure(s) Performed: XI ROBOTIC GASTRIC SLEEVE RESECTION (Abdomen) UPPER GI ENDOSCOPY  Patient Location: PACU  Anesthesia Type:General  Level of Consciousness: awake, alert , oriented and patient cooperative  Airway & Oxygen Therapy: Patient Spontanous Breathing and Patient connected to face mask  Post-op Assessment: Report given to RN and Post -op Vital signs reviewed and stable  Post vital signs: Reviewed and stable  Last Vitals:  Vitals Value Taken Time  BP 129/94 01/28/22 1005  Temp    Pulse 91 01/28/22 1007  Resp 20 01/28/22 1007  SpO2 98 % 01/28/22 1007  Vitals shown include unvalidated device data.  Last Pain:  Vitals:   01/28/22 0626  TempSrc: Oral  PainSc:          Complications: No notable events documented.

## 2022-01-28 NOTE — Anesthesia Postprocedure Evaluation (Signed)
Anesthesia Post Note  Patient: Bryan Wells  Procedure(s) Performed: XI ROBOTIC GASTRIC SLEEVE RESECTION (Abdomen) UPPER GI ENDOSCOPY     Patient location during evaluation: PACU Anesthesia Type: General Level of consciousness: sedated and patient cooperative Pain management: pain level controlled Vital Signs Assessment: post-procedure vital signs reviewed and stable Respiratory status: spontaneous breathing Cardiovascular status: stable Anesthetic complications: no   No notable events documented.  Last Vitals:  Vitals:   01/28/22 1526 01/28/22 1628  BP: (!) 179/83 (!) 163/93  Pulse: 95 95  Resp: (!) 21 (!) 22  Temp:  36.7 C  SpO2: 93% 98%    Last Pain:  Vitals:   01/28/22 1628  TempSrc: Oral  PainSc:                  [name]

## 2022-01-28 NOTE — Progress Notes (Signed)

## 2022-01-28 NOTE — Discharge Instructions (Signed)

## 2022-01-28 NOTE — Op Note (Signed)
° °  Surgeon: Wenda Low, MD, FACS  Asst:  Phylliss Blakes, MD, FACS  28 January 2022  Anes:  General endotracheal  Procedure: Robotic sleeve gastrectomy and upper endoscopy  Diagnosis: Morbid obesity,  BMI > 50  Complications: None noted  EBL:   minimal cc  Description of Procedure:  The patient was take to OR 2 and given general anesthesia.  The abdomen was prepped with Chloroprep and draped sterilely.  A timeout was performed.  Access to the abdomen was achieved with a 5 mm Optiview through the left upper quadrant.  .  Following insufflation, the state of the abdomen was found to be free of adhesions, a fatty liver, and a small umbilical hernia.  The ViSiGi 36Fr tube was inserted to deflate the stomach and was pulled back into the esophagus.  Four trocars were placed including a 12 mm for the robotic stapler.  These were placed higher in the abdomen because of the size of his upper abdomen  The pylorus was identified and we measured ~6 cm back and marked the antrum.  At that point we began dissection to take down the greater curvature of the stomach using the vessel sealer.  This dissection was taken all the way up to the left crus.  Posterior attachments of the stomach were also taken down.    The ViSiGi tube was then passed into the antrum and suction applied so that it was snug along the lessor curvature.  The "crow's foot" or incisura was identified.  The sleeve gastrectomy was begun using the Sureform platform stapler beginning with a blue carthrige and followed by a second blue load and then white loads.  .  When the sleeve was complete the tube was taken off suction and insufflated briefly.  The tube was withdrawn.  Upper endoscopy was then performed by Dr. Daphine Deutscher and he had some residual white matter in the antrum (food).     The specimen was extracted through the 15 trocar site.  PMI was used to close this trocar site with a 0 vicryl.  Local block was provided by infiltrating  abdomen as a TAP block and then closed 4-0 Monocryl and Dermabond.    Matt B. Daphine Deutscher, MD, [facility], [place] [phone]

## 2022-01-28 NOTE — Progress Notes (Signed)
Chaplain engaged in an initial visit with Bryan Wells.  Bryan Wells was in the middle of obtaining some education around his procedure.  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  Chaplain stepped away to give space for Bryan Wells to converse with the nurse.  Chaplain will follow-up tomorrow regarding spiritual need for prayer.     01/28/22 1600  Clinical Encounter Type  Visited With Patient and family together  Visit Type Initial;Spiritual support  Referral From Patient  Consult/Referral To Chaplain

## 2022-01-28 NOTE — Anesthesia Procedure Notes (Signed)
Procedure Name: Intubation Date/Time: 01/28/2022 7:36 AM Performed by: Claudia Desanctis, CRNA Pre-anesthesia Checklist: Patient identified, Emergency Drugs available, Suction available and Patient being monitored Patient Re-evaluated:Patient Re-evaluated prior to induction Oxygen Delivery Method: Circle system utilized Preoxygenation: Pre-oxygenation with 100% oxygen Induction Type: IV induction Ventilation: Mask ventilation without difficulty Laryngoscope Size: 2 and Miller Grade View: Grade I Tube type: Oral Tube size: 8.0 mm Number of attempts: 1 Airway Equipment and Method: Stylet Placement Confirmation: ETT inserted through vocal cords under direct vision, positive ETCO2 and breath sounds checked- equal and bilateral Secured at: 24 cm Tube secured with: Tape Dental Injury: Teeth and Oropharynx as per pre-operative assessment  Comments: Mask straps used

## 2022-01-28 NOTE — Progress Notes (Addendum)
Pt arrived to [facility] room 1312 via stretcher. Pt able to ambulate with walker to chair with x1 stand by assist. VSS, will administer PRN BP med per med administration instructions.  Pt tolerated liquids (all 6 doses ) with no complications and is cooperative using his IS.  Pain medication administered per pt request. Pt has voided post foley removal in PACU.  Will continue to monitor.

## 2022-01-28 NOTE — Interval H&P Note (Signed)
History and Physical Interval Note:  01/28/2022 7:14 AM  Bryan Wells  has presented today for surgery, with the diagnosis of MORBID OBESITY.  The various methods of treatment have been discussed with the patient and family. After consideration of risks, benefits and other options for treatment, the patient has consented to  Procedure(s): XI ROBOTIC GASTRIC SLEEVE RESECTION (N/A) UPPER GI ENDOSCOPY (N/A) as a surgical intervention.  The patient's history has been reviewed, patient examined, no change in status, stable for surgery.  I have reviewed the patient's chart and labs.  Questions were answered to the patient's satisfaction.     [name]

## 2022-01-28 NOTE — Progress Notes (Signed)
Inpatient Diabetes Program Recommendations  AACE/ADA: New Consensus Statement on Inpatient Glycemic Control (2015)  Target Ranges:  Prepandial:   less than 140 mg/dL      Peak postprandial:   less than 180 mg/dL (1-2 hours)      Critically ill patients:  140 - 180 mg/dL   Lab Results  Component Value Date   GLUCAP 197 (H) 01/28/2022   HGBA1C 9.1 (H) 01/20/2022    Review of Glycemic Control  Diabetes history: DM2 Outpatient Diabetes medications: Tresiba 40 QD, Amaryl 2 mg QAM, Farxiga 20 mg QD, metformin 500 mg BID Current orders for Inpatient glycemic control: None  Inpatient Diabetes Program Recommendations:    Add Novolog 0-20 units Q4H  Will speak with pt about his diabetes control and home meds on 2/8.  Thank you. Lorenda Peck, RD, LDN, CDE Inpatient Diabetes Coordinator [phone]

## 2022-01-29 ENCOUNTER — Encounter (HOSPITAL_COMMUNITY): Payer: Self-pay | Admitting: Surgery

## 2022-01-29 LAB — CBC WITH DIFFERENTIAL/PLATELET
Abs Immature Granulocytes: 0.03 10*3/uL (ref 0.00–0.07)
Basophils Absolute: 0 10*3/uL (ref 0.0–0.1)
Basophils Relative: 0 %
Eosinophils Absolute: 0 10*3/uL (ref 0.0–0.5)
Eosinophils Relative: 0 %
HCT: 42.7 % (ref 39.0–52.0)
Hemoglobin: 13.6 g/dL (ref 13.0–17.0)
Immature Granulocytes: 0 %
Lymphocytes Relative: 16 %
Lymphs Abs: 1.3 10*3/uL (ref 0.7–4.0)
MCH: 28.7 pg (ref 26.0–34.0)
MCHC: 31.9 g/dL (ref 30.0–36.0)
MCV: 90.1 fL (ref 80.0–100.0)
Monocytes Absolute: 0.8 10*3/uL (ref 0.1–1.0)
Monocytes Relative: 10 %
Neutro Abs: 6.1 10*3/uL (ref 1.7–7.7)
Neutrophils Relative %: 74 %
Platelets: 221 10*3/uL (ref 150–400)
RBC: 4.74 MIL/uL (ref 4.22–5.81)
RDW: 14.1 % (ref 11.5–15.5)
WBC: 8.2 10*3/uL (ref 4.0–10.5)
nRBC: 0 % (ref 0.0–0.2)

## 2022-01-29 LAB — GLUCOSE, CAPILLARY
Glucose-Capillary: 170 mg/dL — ABNORMAL HIGH (ref 70–99)
Glucose-Capillary: 176 mg/dL — ABNORMAL HIGH (ref 70–99)
Glucose-Capillary: 180 mg/dL — ABNORMAL HIGH (ref 70–99)
Glucose-Capillary: 185 mg/dL — ABNORMAL HIGH (ref 70–99)

## 2022-01-29 LAB — SURGICAL PATHOLOGY

## 2022-01-29 NOTE — Progress Notes (Signed)
Patient ID: Bryan Wells, male   DOB: [birth year], 61 y.o.   MRN: [id number] [facility] Surgery Progress Note:   1 Day Post-Op  Subjective: Mental status is clear.  Complaints none this morning.  Used CPaP. Objective: Vital signs in last 24 hours: Temp:  [97.9 F (36.6 C)-98.4 F (36.9 C)] 98.2 F (36.8 C) (02/08 0941) Pulse Rate:  [78-101] 81 (02/08 0941) Resp:  [16-22] 16 (02/08 0941) BP: (118-179)/(69-93) 139/69 (02/08 0941) SpO2:  [93 %-98 %] 96 % (02/08 0941) Weight:  [192.1 kg] 192.1 kg (02/08 0941)  Intake/Output from previous day: 02/07 0701 - 02/08 0700 In: 4184.6 [P.O.:780; I.V.:3304.6; IV Piggyback:100] Out: 2650 [Urine:2625; Blood:25] Intake/Output this shift: No intake/output data recorded.  Physical Exam: Work of breathing is OK.  Used CPAP  Doing well  Lab Results:  Results for orders placed or performed during the hospital encounter of 01/28/22 (from the past 48 hour(s))  Glucose, capillary     Status: Abnormal   Collection Time: 01/28/22  6:12 AM  Result Value Ref Range   Glucose-Capillary 173 (H) 70 - 99 mg/dL    Comment: Glucose reference range applies only to samples taken after fasting for at least 8 hours.   Comment 1 Notify RN   Type and screen [facility]     Status: None   Collection Time: 01/28/22  6:23 AM  Result Value Ref Range   ABO/RH(D) B POS    Antibody Screen NEG    Sample Expiration      01/31/2022,2359 Performed at [facility], [address], [place], [place] [address]   Comprehensive metabolic panel     Status: Abnormal   Collection Time: 01/28/22  6:23 AM  Result Value Ref Range   Sodium 138 135 - 145 mmol/L   Potassium 3.4 (L) 3.5 - 5.1 mmol/L   Chloride 103 98 - 111 mmol/L   CO2 24 22 - 32 mmol/L   Glucose, Bld 153 (H) 70 - 99 mg/dL    Comment: Glucose reference range applies only to samples taken after fasting for at least 8 hours.   BUN 15 6 - 20 mg/dL   Creatinine, Ser 9.98 0.61 -  1.24 mg/dL   Calcium 9.2 8.9 - 33.8 mg/dL   Total Protein 7.6 6.5 - 8.1 g/dL   Albumin 3.6 3.5 - 5.0 g/dL   AST 40 15 - 41 U/L   ALT 37 0 - 44 U/L   Alkaline Phosphatase 55 38 - 126 U/L   Total Bilirubin 0.2 (L) 0.3 - 1.2 mg/dL   GFR, Estimated >25 >05 mL/min    Comment: (NOTE) Calculated using the CKD-EPI Creatinine Equation (2021)    Anion gap 11 5 - 15    Comment: Performed at [facility], [address] [place], [place] [address]  ABO/Rh     Status: None   Collection Time: 01/28/22  6:27 AM  Result Value Ref Range   ABO/RH(D)      B POS Performed at [facility], [address], [place], [place] [address]   Surgical pathology     Status: None   Collection Time: 01/28/22  9:14 AM  Result Value Ref Range   SURGICAL PATHOLOGY      SURGICAL PATHOLOGY CASE: [id number] PATIENT: Bryan Wells Surgical Pathology Report     Clinical History: Morbid obesity (jmc)     FINAL MICROSCOPIC DIAGNOSIS:  A. STOMACH, GREATER CURVATURE, SLEEVE RESECTION: -Gross diagnosis only: Portion of  unremarkable stomach.  GROSS DESCRIPTION:  Received fresh is a 25.6 x 3.2 x 2.6 cm partial gastrectomy.  The margin is stapled.  The serosa is smooth and tan.  The mucosa is glistening, tan with preservation of the rugal folds.  There are no discrete masses or areas of ulceration.  No sections are submitted.  [facility] 01/28/2022)   Final Diagnosis performed by Jimmy Picket, MD.   Electronically signed 01/29/2022 Technical and / or Professional components performed at [facility], [address], [place], [place] [address].  Immunohistochemistry Technical component (if applicable) was performed at [facility]. [address], [place], [place] [address].   IMMUNOHISTOCHEMISTR Y DISCLAIMER (if applicable): Some of these immunohistochemical stains may have been developed and the performance characteristics  determine by [facility]. Some may not have been cleared or approved by the U.S. Food and Drug Administration. The FDA has determined that such clearance or approval is not necessary. This test is used for clinical purposes. It should not be regarded as investigational or for research. This laboratory is certified under the Clinical Laboratory Improvement Amendments of 1988 (CLIA-88) as qualified to perform high complexity clinical laboratory testing.  The controls stained appropriately.   Glucose, capillary     Status: Abnormal   Collection Time: 01/28/22 10:08 AM  Result Value Ref Range   Glucose-Capillary 197 (H) 70 - 99 mg/dL    Comment: Glucose reference range applies only to samples taken after fasting for at least 8 hours.  CBC     Status: None   Collection Time: 01/28/22  2:16 PM  Result Value Ref Range   WBC 8.4 4.0 - 10.5 K/uL   RBC 4.92 4.22 - 5.81 MIL/uL   Hemoglobin 14.1 13.0 - 17.0 g/dL   HCT 17.9 15.0 - 56.9 %   MCV 89.2 80.0 - 100.0 fL   MCH 28.7 26.0 - 34.0 pg   MCHC 32.1 30.0 - 36.0 g/dL   RDW 79.4 80.1 - 65.5 %   Platelets 229 150 - 400 K/uL   nRBC 0.0 0.0 - 0.2 %    Comment: Performed at [facility], [address] [place], [place] [address]  Creatinine, serum     Status: None   Collection Time: 01/28/22  2:16 PM  Result Value Ref Range   Creatinine, Ser 0.96 0.61 - 1.24 mg/dL   GFR, Estimated >70 >78 mL/min    Comment: (NOTE) Calculated using the CKD-EPI Creatinine Equation (2021) Performed at [facility], [address], [place], [place] [address]   Glucose, capillary     Status: Abnormal   Collection Time: 01/28/22  7:59 PM  Result Value Ref Range   Glucose-Capillary 200 (H) 70 - 99 mg/dL    Comment: Glucose reference range applies only to samples taken after fasting for at least 8 hours.  CBC WITH DIFFERENTIAL     Status: None   Collection Time: 01/29/22  4:32 AM  Result Value Ref Range   WBC  8.2 4.0 - 10.5 K/uL   RBC 4.74 4.22 - 5.81 MIL/uL   Hemoglobin 13.6 13.0 - 17.0 g/dL   HCT 92.0 10.0 - 71.2 %   MCV 90.1 80.0 - 100.0 fL   MCH 28.7 26.0 - 34.0 pg   MCHC 31.9 30.0 - 36.0 g/dL   RDW 19.7 58.8 - 32.5 %   Platelets 221 150 - 400 K/uL   nRBC 0.0 0.0 - 0.2 %   Neutrophils  Relative % 74 %   Neutro Abs 6.1 1.7 - 7.7 K/uL   Lymphocytes Relative 16 %   Lymphs Abs 1.3 0.7 - 4.0 K/uL   Monocytes Relative 10 %   Monocytes Absolute 0.8 0.1 - 1.0 K/uL   Eosinophils Relative 0 %   Eosinophils Absolute 0.0 0.0 - 0.5 K/uL   Basophils Relative 0 %   Basophils Absolute 0.0 0.0 - 0.1 K/uL   Immature Granulocytes 0 %   Abs Immature Granulocytes 0.03 0.00 - 0.07 K/uL    Comment: Performed at [facility], [address], [place], [place] [address]  Glucose, capillary     Status: Abnormal   Collection Time: 01/29/22 11:34 AM  Result Value Ref Range   Glucose-Capillary 185 (H) 70 - 99 mg/dL    Comment: Glucose reference range applies only to samples taken after fasting for at least 8 hours.    Radiology/Results: No results found.  Anti-infectives: Anti-infectives (From admission, onward)    Start     Dose/Rate Route Frequency Ordered Stop   01/28/22 0600  cefoTEtan (CEFOTAN) 2 g in sodium chloride 0.9 % 100 mL IVPB        2 g 200 mL/hr over 30 Minutes Intravenous On call to O.R. 01/28/22 0526 01/28/22 0807       Assessment/Plan: Problem List: Patient Active Problem List   Diagnosis Date Noted   S/P laparoscopic sleeve gastrectomy 01/28/2022   Anxiety 01/14/2022   Chronic pain 01/14/2022   History of dissection of thoracic aorta 07/23/2021   Ruptured aneurysm of thoracoabdominal aorta 07/23/2021   Hypertension associated with diabetes (HCC) 07/23/2021   Aortic atherosclerosis (HCC) 07/23/2021   Coronary artery calcification 07/23/2021   Dissection of thoracic aorta 06/13/2021   Dyslipidemia 06/13/2021   Dysphagia 06/13/2021   Gastro-esophageal  reflux disease without esophagitis 06/13/2021   Long term (current) use of insulin (HCC) 06/13/2021   Osteoarthritis of knee 06/13/2021   Pure hypercholesterolemia 06/13/2021   Thoracic aortic aneurysm without rupture 06/13/2021   Type 2 diabetes mellitus with other specified complication (HCC) 06/13/2021   History of Bell's palsy 02/28/2020   Sudden left hearing loss 02/28/2020   Tinnitus of left ear 02/28/2020   Noncompliance with diabetes treatment 10/02/2018   Bell's palsy 08/21/2017   Hyperlipidemia 08/21/2017   Morbid obesity with BMI of 50.0-59.9, adult (HCC) 08/21/2017   Sleep apnea 08/21/2017   Type 2 diabetes mellitus with hyperglycemia (HCC) 08/21/2017   SINUSITIS, CHRONIC 12/11/2009   ERECTILE DYSFUNCTION, ORGANIC 12/11/2009   MORBID OBESITY 12/10/2009   DEPRESSION 12/10/2009   ULNAR NERVE ENTRAPMENT 12/10/2009   AORTIC ANEURYSM 12/10/2009   HYPERTENSION NEC 12/10/2009   History of AAA (abdominal aortic aneurysm) repair 12/10/2009   Hypertension 12/10/2009    Will likely need Lovenox post discharge.  Not ready for discharge today.  Will need DME walker also.   1 Day Post-Op    LOS: 1 day   Matt B. Daphine Deutscher, MD, [facility] [phone] to reach the surgeon on call.    01/29/2022 1:32 PM

## 2022-01-29 NOTE — TOC Benefit Eligibility Note (Signed)
Transition of Care [facility]) Benefit Eligibility Note    Patient Details  Name: Bryan Wells MRN: [id number] Date of Birth: 1961-05-09   Medication/Dose: Enoxaparin 60 mg Timpson  q 12 hr x2 weeks  Covered?: Yes  Tier: Other (tier 4)  Prescription Coverage Preferred Pharmacy: local  Spoke with Person/Company/Phone Number:: Danielle/ [facility] [phone] opt. 4  Co-Pay: $27.35  Prior Approval: No  Deductible: Met       Kerin Salen Phone Number: 01/29/2022, 10:58 AM

## 2022-01-29 NOTE — Progress Notes (Signed)
°   01/29/22 1458  Mobility  Activity Ambulated with assistance in hallway  Level of Assistance Standby assist, set-up cues, supervision of patient - no hands on  Assistive Device Front wheel walker  Distance Ambulated (ft) 80 ft  Activity Response Tolerated well  $Mobility charge 1 Mobility   Pt agreeable to mobilize this afternoon. Ambulated about 45ft in hall with RW, tolerated well. Pt noted his legs felt weak, otherwise no complaints. Left pt in bed, call bell at side. RN/NT notified of session.   Timoteo Expose Mobility Specialist Acute Rehab Services Office: [phone]

## 2022-01-29 NOTE — Progress Notes (Signed)
Patient alert and oriented, pain is controlled. Patient is tolerating fluids, advanced to protein shake today, patient is tolerating well.  Reviewed Gastric sleeve discharge instructions with patient and patient is able to articulate understanding.  Provided information on BELT program, Support Group and [facility] outpatient pharmacy. All questions answered, will continue to monitor.  Reviewed Lovenox teaching kit and pt reviewed Lovenox "Patient-Self Injection Video".   

## 2022-01-29 NOTE — TOC Transition Note (Signed)
Transition of Care [facility]) - CM/SW Discharge Note  Patient Details  Name: Bryan Wells MRN: [id number] Date of Birth: November 05, 1961  Transition of Care [facility]) CM/SW Contact:  Sherie Don, LCSW Phone Number: 01/29/2022, 11:13 AM  Clinical Narrative: Patient provided medication benefits check information. TOC signing off.  Final next level of care: Home/Self Care Barriers to Discharge: No Barriers Identified  Patient Goals and CMS Choice Choice offered to / list presented to : NA  Discharge Plan and Services         DME Arranged: N/A DME Agency: NA  Readmission Risk Interventions No flowsheet data found.

## 2022-01-29 NOTE — Progress Notes (Signed)
Inpatient Diabetes Program Recommendations  AACE/ADA: New Consensus Statement on Inpatient Glycemic Control (2015)  Target Ranges:  Prepandial:   less than 140 mg/dL      Peak postprandial:   less than 180 mg/dL (1-2 hours)      Critically ill patients:  140 - 180 mg/dL   Lab Results  Component Value Date   GLUCAP 185 (H) 01/29/2022   HGBA1C 9.1 (H) 01/20/2022    Review of Glycemic Control  185 mg/dL Has received no insulin since admission  Current orders for Inpatient glycemic control: None  Inpatient Diabetes Program Recommendations:    Add Novolog 0-20 units Q4H  Spoke with pt at bedside regarding his glycemic control and HgbA1C of 9.1%.  Instructed pt to call PCP regarding insulin dosing. Monitor blood sugars at least 4x/day. If > 200 mg/dL for several times, call PCP and ask about how much insulin to take. Pt voiced understanding.  Thank you. Ailene Ards, RD, LDN, CDE Inpatient Diabetes Coordinator [phone]

## 2022-01-30 LAB — CBC WITH DIFFERENTIAL/PLATELET
Abs Immature Granulocytes: 0.02 10*3/uL (ref 0.00–0.07)
Basophils Absolute: 0.1 10*3/uL (ref 0.0–0.1)
Basophils Relative: 1 %
Eosinophils Absolute: 0.2 10*3/uL (ref 0.0–0.5)
Eosinophils Relative: 3 %
HCT: 43.6 % (ref 39.0–52.0)
Hemoglobin: 14.1 g/dL (ref 13.0–17.0)
Immature Granulocytes: 0 %
Lymphocytes Relative: 31 %
Lymphs Abs: 2.1 10*3/uL (ref 0.7–4.0)
MCH: 29.4 pg (ref 26.0–34.0)
MCHC: 32.3 g/dL (ref 30.0–36.0)
MCV: 90.8 fL (ref 80.0–100.0)
Monocytes Absolute: 0.6 10*3/uL (ref 0.1–1.0)
Monocytes Relative: 9 %
Neutro Abs: 3.9 10*3/uL (ref 1.7–7.7)
Neutrophils Relative %: 56 %
Platelets: 238 10*3/uL (ref 150–400)
RBC: 4.8 MIL/uL (ref 4.22–5.81)
RDW: 14.5 % (ref 11.5–15.5)
WBC: 6.9 10*3/uL (ref 4.0–10.5)
nRBC: 0 % (ref 0.0–0.2)

## 2022-01-30 LAB — GLUCOSE, CAPILLARY
Glucose-Capillary: 173 mg/dL — ABNORMAL HIGH (ref 70–99)
Glucose-Capillary: 182 mg/dL — ABNORMAL HIGH (ref 70–99)
Glucose-Capillary: 159 mg/dL — ABNORMAL HIGH (ref 70–99)

## 2022-01-30 MED ORDER — ENOXAPARIN SODIUM 60 MG/0.6ML IJ SOSY: 60.0000 mg | PREFILLED_SYRINGE | Freq: Two times a day (BID) | INTRAMUSCULAR | 0 refills | Status: DC

## 2022-01-30 MED ORDER — OXYCODONE HCL 5 MG PO TABS: 5.0000 mg | ORAL_TABLET | Freq: Four times a day (QID) | ORAL | 0 refills | Status: DC | PRN

## 2022-01-30 MED ORDER — ONDANSETRON 4 MG PO TBDP: 4.0000 mg | ORAL_TABLET | Freq: Four times a day (QID) | ORAL | 0 refills | Status: AC | PRN

## 2022-01-30 MED ORDER — PANTOPRAZOLE SODIUM 40 MG PO TBEC: 40.0000 mg | DELAYED_RELEASE_TABLET | Freq: Every day | ORAL | 0 refills | Status: AC

## 2022-01-30 NOTE — Discharge Summary (Signed)
Physician Discharge Summary  Patient ID: Bryan Wells MRN: [id number] DOB/AGE: [birth year] 62 y.o.  PCP: Georgann Housekeeper, MD  Admit date: 01/28/2022 Discharge date: 01/30/2022  Admission Diagnoses:  severe morbid obesity BMI>50  Discharge Diagnoses:  same  Principal Problem:   S/P laparoscopic sleeve gastrectomy   Surgery:  robotic sleeve gastrectomy  Discharged Condition: improved  Hospital Course:   had surgery.  Slowly advanced diet until ready for discharge.  Was sent home with Lovenox for DVT prevention  Consults: none  Significant Diagnostic Studies: none    Discharge Exam: Blood pressure (!) 141/79, pulse 85, temperature 97.8 F (36.6 C), temperature source Oral, resp. rate 16, height 6' (1.829 m), weight (!) 192.1 kg, SpO2 96 %. Incisions healing OK  Disposition: Discharge disposition: 01-Home or Self Care       Discharge Instructions     Ambulate hourly while awake   Complete by: As directed    Call MD for:  difficulty breathing, headache or visual disturbances   Complete by: As directed    Call MD for:  persistant dizziness or light-headedness   Complete by: As directed    Call MD for:  persistant nausea and vomiting   Complete by: As directed    Call MD for:  redness, tenderness, or signs of infection (pain, swelling, redness, odor or green/yellow discharge around incision site)   Complete by: As directed    Call MD for:  severe uncontrolled pain   Complete by: As directed    Call MD for:  temperature >101 F   Complete by: As directed    Diet bariatric full liquid   Complete by: As directed    Discharge instructions   Complete by: As directed    Lovenox 60 mg subcutaneously twice a day for 2 weeks to reduce risk of blood clots in legs   For home use only DME 4 wheeled rolling walker with seat   Complete by: As directed    Patient needs a walker to treat with the following condition: At high risk for complication of immobility   Incentive spirometry    Complete by: As directed    Perform hourly while awake      Allergies as of 01/30/2022       Reactions   Ciprofloxacin Hives   Hydrochlorothiazide Swelling   Ibuprofen    stomach upset   Lisinopril Swelling   angioedma   Penicillins Other (See Comments)   Loss of consciousness Has patient had a PCN reaction causing immediate rash, facial/tongue/throat swelling, SOB or lightheadedness with hypotension: Yes Has patient had a PCN reaction causing severe rash involving mucus membranes or skin necrosis: No Has patient had a PCN reaction that required hospitalization: Unknown Has patient had a PCN reaction occurring within the last 10 years: No If all of the above answers are "NO", then may proceed with Cephalosporin use. Other reaction(s): hives   Strawberry (diagnostic) Itching        Medication List     STOP taking these medications    ibuprofen 200 MG tablet Commonly known as: ADVIL   ketorolac 30 MG/ML injection Commonly known as: TORADOL   meloxicam 15 MG tablet Commonly known as: MOBIC       TAKE these medications    Accu-Chek Softclix Lancets lancets daily.   acetaminophen 500 MG tablet Commonly known as: TYLENOL Take 500 mg by mouth every 6 (six) hours as needed for moderate pain.   aspirin EC 81 MG tablet Take 81 mg  by mouth daily.   atorvastatin 10 MG tablet Commonly known as: LIPITOR Take 10 mg by mouth daily.   BD Pen Needle Micro U/F 32G X 6 MM Misc Generic drug: Insulin Pen Needle See admin instructions.   clotrimazole-betamethasone cream Commonly known as: LOTRISONE Apply 1 application topically 2 (two) times daily as needed (for skin fold/skin irritation.).   dapagliflozin propanediol 10 MG Tabs tablet Commonly known as: FARXIGA Take 10 mg by mouth daily. Notes to patient: Monitor Blood Sugar Frequently and keep a log for primary care physician, you may need to adjust medication dosage with rapid weight loss.     diclofenac Sodium 1  % Gel Commonly known as: VOLTAREN Apply 1 application topically 4 (four) times daily as needed (pain). Notes to patient: Avoid NSAIDs for 6-8 weeks after surgery   diltiazem 240 MG 24 hr capsule Commonly known as: CARDIZEM CD Take 240 mg by mouth at bedtime. Notes to patient: Monitor Blood Pressure Daily and keep a log for primary care physician.  You may need to make changes to your medications with rapid weight loss.     enoxaparin 60 MG/0.6ML injection Commonly known as: LOVENOX Inject 0.6 mLs (60 mg total) into the skin every 12 (twelve) hours for 28 doses.   furosemide 40 MG tablet Commonly known as: LASIX Take 40 mg by mouth daily. Notes to patient: Monitor Blood Pressure Daily and keep a log for primary care physician.  Monitor for symptoms of dehydration.  You may need to make changes to your medications with rapid weight loss.     glimepiride 2 MG tablet Commonly known as: AMARYL Take 2 mg by mouth daily with breakfast. Notes to patient: Monitor Blood Sugar Frequently and keep a log for primary care physician, you may need to adjust medication dosage with rapid weight loss.     glucose blood test strip use to check blood sugars   hydrOXYzine 25 MG tablet Commonly known as: ATARAX Take 25 mg by mouth daily.   Kaopectate 262 MG/15ML suspension Generic drug: bismuth subsalicylate Take 30 mLs by mouth every 6 (six) hours as needed for indigestion.   ketoconazole 2 % cream Commonly known as: NIZORAL Apply to both feet and between toes once daily for 6 weeks.   labetalol 300 MG tablet Commonly known as: NORMODYNE Take 300 mg by mouth 2 (two) times daily. Notes to patient: Monitor Blood Pressure Daily and keep a log for primary care physician.  You may need to make changes to your medications with rapid weight loss.     metFORMIN 1000 MG tablet Commonly known as: GLUCOPHAGE Take 500 mg by mouth 2 (two) times daily. Notes to patient: Monitor Blood Sugar Frequently and  keep a log for primary care physician, you may need to adjust medication dosage with rapid weight loss.     Needle (Disp) 32G X 5/16" Misc See admin instructions.   NONFORMULARY OR COMPOUNDED ITEM Antifungal solution: Terbinafine 3%, Fluconazole 2%, Tea Tree Oil 5%, Urea 10%, Ibuprofen 2% in DMSO suspension #31mL What changed:  when to take this reasons to take this   ondansetron 4 MG disintegrating tablet Commonly known as: ZOFRAN-ODT Take 1 tablet (4 mg total) by mouth every 6 (six) hours as needed for nausea or vomiting.   oxyCODONE 5 MG immediate release tablet Commonly known as: Oxy IR/ROXICODONE Take 1 tablet (5 mg total) by mouth every 6 (six) hours as needed for severe pain.   pantoprazole 40 MG tablet Commonly known as:  PROTONIX Take 40 mg by mouth 2 (two) times daily. What changed: Another medication with the same name was added. Make sure you understand how and when to take each.   pantoprazole 40 MG tablet Commonly known as: PROTONIX Take 1 tablet (40 mg total) by mouth daily. What changed: You were already taking a medication with the same name, and this prescription was added. Make sure you understand how and when to take each.   tiZANidine 4 MG tablet Commonly known as: ZANAFLEX Take 4 mg by mouth daily.   Evaristo Bury FlexTouch 200 UNIT/ML FlexTouch Pen Generic drug: insulin degludec Inject 40 Units into the skin daily. Notes to patient: Monitor Blood Sugar Frequently and keep a log for primary care physician, you may need to adjust medication dosage with rapid weight loss.     triamcinolone acetonide 40 MG/ML injection Commonly known as: KENALOG-40 Inject 1 mL into the muscle See admin instructions. Every 6 months as needed for arthritis               Durable Medical Equipment  (From admission, onward)           Start     Ordered   01/30/22 0000  For home use only DME 4 wheeled rolling walker with seat       Question:  Patient needs a walker to  treat with the following condition  Answer:  At high risk for complication of immobility   01/30/22 2025            Follow-up Information     Luretha Murphy, MD. Go on 02/26/2022.   Specialty: General Surgery Why: at 9am in the [facility] OFFICE.  Please arrive 15 minutes prior to your appointment time.  Thank you. Contact information: [address] [place] [address] [phone]         Surgery, [place]. Go on 03/26/2022.   Specialty: General Surgery Why: at 9:15am with Dr. Daphine Deutscher at the [facility]  Please arrive 15 minutes prior to your appointment time.  Thank you. Contact information: [address] [place] [address] [phone]                 Signed: [name] 01/30/2022, 8:33 AM

## 2022-01-30 NOTE — Progress Notes (Signed)
Pt discharge instructions were reviewed and all questions were answered. Patient was instructed on how to administer a lovenox shot to himself and all questions were answered. Walker was delivered to patients room and sent home with patient.

## 2022-01-30 NOTE — TOC Progression Note (Signed)
Transition of Care [facility]) - Progression Note   Patient Details  Name: Bryan Wells MRN: [id number] Date of Birth: 11/07/1961  Transition of Care [facility]) CM/SW [place], LCSW Phone Number: 01/30/2022, 10:45 AM  Clinical Narrative: Patient will need a bariatric rollator. CSW made referral to Kendrick with [employer]. [employer] to deliver walker to patient's room. CSW updated patient.    Barriers to Discharge: No Barriers Identified  Expected Discharge Plan and Services Post Acute Care Choice: Durable Medical Equipment Expected Discharge Date: 01/30/22               DME Arranged: Gilford Rile rolling with seat DME Agency: [employer] Date DME Agency Contacted: 01/30/22 Time DME Agency Contacted: [phone] Representative spoke with at DME Agency: [place]  Readmission Risk Interventions No flowsheet data found.

## 2022-01-30 NOTE — Plan of Care (Signed)
  Problem: Education: Goal: Knowledge of General Education information will improve Description: Including pain rating scale, medication(s)/side effects and non-pharmacologic comfort measures Outcome: Adequate for Discharge   Problem: Health Behavior/Discharge Planning: Goal: Ability to manage health-related needs will improve Outcome: Adequate for Discharge   Problem: Clinical Measurements: Goal: Ability to maintain clinical measurements within normal limits will improve Outcome: Adequate for Discharge Goal: Will remain free from infection Outcome: Adequate for Discharge Goal: Diagnostic test results will improve Outcome: Adequate for Discharge Goal: Respiratory complications will improve Outcome: Adequate for Discharge Goal: Cardiovascular complication will be avoided Outcome: Adequate for Discharge   Problem: Activity: Goal: Risk for activity intolerance will decrease Outcome: Adequate for Discharge   Problem: Nutrition: Goal: Adequate nutrition will be maintained Outcome: Adequate for Discharge   Problem: Coping: Goal: Level of anxiety will decrease Outcome: Adequate for Discharge   Problem: Elimination: Goal: Will not experience complications related to bowel motility Outcome: Adequate for Discharge Goal: Will not experience complications related to urinary retention Outcome: Adequate for Discharge   Problem: Pain Managment: Goal: General experience of comfort will improve Outcome: Adequate for Discharge   Problem: Safety: Goal: Ability to remain free from injury will improve Outcome: Adequate for Discharge   Problem: Skin Integrity: Goal: Risk for impaired skin integrity will decrease Outcome: Adequate for Discharge   Problem: Education: Goal: Ability to state signs and symptoms to report to health care provider will improve Outcome: Adequate for Discharge Goal: Knowledge of the prescribed self-care regimen will improve Outcome: Adequate for  Discharge Goal: Knowledge of discharge needs will improve Outcome: Adequate for Discharge   Problem: Activity: Goal: Ability to tolerate increased activity will improve Outcome: Adequate for Discharge   Problem: Bowel/Gastric: Goal: Gastrointestinal status for postoperative course will improve Outcome: Adequate for Discharge Goal: Occurrences of nausea will decrease Outcome: Adequate for Discharge   Problem: Coping: Goal: Development of coping mechanisms to deal with changes in body function or appearance will improve Outcome: Adequate for Discharge   Problem: Fluid Volume: Goal: Maintenance of adequate hydration will improve Outcome: Adequate for Discharge   Problem: Nutritional: Goal: Nutritional status will improve Outcome: Adequate for Discharge   Problem: Clinical Measurements: Goal: Will show no signs or symptoms of venous thromboembolism Outcome: Adequate for Discharge Goal: Will remain free from infection Outcome: Adequate for Discharge Goal: Will show no signs of GI Leak Outcome: Adequate for Discharge   Problem: Respiratory: Goal: Will regain and/or maintain adequate ventilation Outcome: Adequate for Discharge   Problem: Pain Management: Goal: Pain level will decrease Outcome: Adequate for Discharge   Problem: Skin Integrity: Goal: Demonstration of wound healing without infection will improve Outcome: Adequate for Discharge   

## 2022-01-30 NOTE — Progress Notes (Signed)
24hr fluid recall prior to discharge: 475mL of protein documented.  Per dehydration protocol, will call pt to f/u within one week post op. Pt was able to tolerate bari clears prior to discharge.

## 2022-01-30 NOTE — Progress Notes (Signed)
Chaplain engaged in a follow-up visit with Caryn Bee.  Kermitt did a great job of explaining his procedure and current health state.  He is aware of what he needs to pay attention to upon discharging.  Bern shared about his healthcare journey.  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  During the time of caring for others he lost sight of how to care for himself.  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  He desires for ministry to look different for him in the future.  Terrian shared about some future plans that he has concerning helping others and spreading the [employer].   Chaplain offered listening, support and blessing over his future endeavors.    Jayant also expressed a need to see the Child psychotherapist.  Valgene wants to know about any home healthcare options available to him.  He believes he will need some help once returning home.  Chaplain informed Christapher's nurse.     01/30/22 1200  Clinical Encounter Type  Visited With Patient  Visit Type Follow-up;Spiritual support

## 2022-01-31 ENCOUNTER — Telehealth (HOSPITAL_COMMUNITY): Payer: Self-pay | Admitting: *Deleted

## 2022-01-31 NOTE — Telephone Encounter (Signed)
1.  Tell me about your pain and pain management? Pt denies any pain.  2.  Let's talk about fluid intake.  How much total fluid are you taking in? Pt states that he is working to meet goal of 64 oz of fluid today.  Today, he has consumed approx. 30 fluid oz but has a plan to get in another protein shake, Gatorade and broth before the end of the night.  Pt instructed to assess status and suggestions daily utilizing Hydration Action Plan on discharge folder and to call [facility] if in the "red zone".   3.  How much protein have you taken in the last 2 days? Pt states that he is working to meet goal of goal of 80g of protein today.  Pt has already consumed 2 protein shake(s).  Pt plans to drink remainder of protein throughout the rest of the day to meet goal.  4.  Have you had nausea?  Tell me about when have experienced nausea and what you did to help? Pt denies current nausea. Pt states that the nausea medications helps when he has been nauseated.   5.  Has the frequency or color changed with your urine? Pt states that he is urinating "fine" with no changes in frequency or urgency.     6.  Tell me what your incisions look like? "Incisions look fine". Pt denies a fever, chills.  Pt states incisions are not swollen, open, or draining.  Pt encouraged to call [facility] if incisions change.   7.  Have you been passing gas? BM? Pt states that she has not had a BM but is passing gas.  Pt instructed to take either Miralax or MoM as instructed per "Gastric Bypass/Sleeve Discharge Home Care Instructions".  Pt to call surgeon's office if not able to have BM with medication.   8.  If a problem or question were to arise who would you call?  Do you know contact numbers for [facility], [facility], and [facility]? Pt denies dehydration symptoms.  Pt can describe s/sx of dehydration.  Pt knows to call [facility] for surgical, [facility] for nutrition, and [facility] for non-urgent questions or concerns.   9.  How has the walking going? Pt states he is walking  around and able to be active without much difficulty. He is using his walker.   10. Are you still using your incentive spirometer?  If so, how often? Pt states that he has done the I.S. twice today. Pt encouraged to use incentive spirometer, at least 10x every hour while awake until he sees the surgeon.  11.  How are your vitamins and calcium going?  How are you taking them? Pt states that s/he is taking his supplements and vitamins without difficulty.  Pt states that he is taking the Lovenox injections without difficulty.  Reinforced the need to take injections 12 hours apart each day.  Pt is also checking CBGs and following up with his PCP.  Reminded patient that the first 30 days post-operatively are important for successful recovery.  Practice good hand hygiene, wearing a mask when appropriate (since optional in most places), and minimizing exposure to people who live outside of the home, especially if they are exhibiting any respiratory, GI, or illness-like symptoms.

## 2022-02-11 ENCOUNTER — Other Ambulatory Visit: Payer: Self-pay

## 2022-02-11 ENCOUNTER — Encounter: Payer: 59 | Attending: Surgery | Admitting: Skilled Nursing Facility1

## 2022-02-11 DIAGNOSIS — E1165 Type 2 diabetes mellitus with hyperglycemia: Secondary | ICD-10-CM | POA: Insufficient documentation

## 2022-02-13 DIAGNOSIS — Z794 Long term (current) use of insulin: Secondary | ICD-10-CM | POA: Diagnosis not present

## 2022-02-13 DIAGNOSIS — Z9884 Bariatric surgery status: Secondary | ICD-10-CM | POA: Diagnosis not present

## 2022-02-13 DIAGNOSIS — E1165 Type 2 diabetes mellitus with hyperglycemia: Secondary | ICD-10-CM | POA: Diagnosis not present

## 2022-02-13 DIAGNOSIS — M169 Osteoarthritis of hip, unspecified: Secondary | ICD-10-CM | POA: Diagnosis not present

## 2022-02-13 NOTE — Progress Notes (Signed)
2 Week Post-Operative Nutrition Class   Patient was seen on 02/11/2022 for Post-Operative Nutrition education at [facility].    Surgery date: 01/28/2022 Surgery type: sleeve Start weight at [facility]: 412 Weight today: 401.2 pounds Bowel Habits: Every day to every other day no complaints      The following the learning objectives were met by the patient during this course: Identifies Phase 3 (Soft, High Proteins) Dietary Goals and will begin from 2 weeks post-operatively to 2 months post-operatively Identifies appropriate sources of fluids and proteins  Identifies appropriate fat sources and healthy verses unhealthy fat types   States protein recommendations and appropriate sources post-operatively Identifies the need for appropriate texture modifications, mastication, and bite sizes when consuming solids Identifies appropriate fat consumption and sources Identifies appropriate multivitamin and calcium sources post-operatively Describes the need for physical activity post-operatively and will follow MD recommendations States when to call healthcare provider regarding medication questions or post-operative complications   Handouts given during class include: Phase 3A: Soft, High Protein Diet Handout Phase 3 High Protein Meals Healthy Fats   Follow-Up Plan: Patient will follow-up at [facility] in 6 weeks for 2 month post-op nutrition visit for diet advancement per MD.

## 2022-02-17 ENCOUNTER — Telehealth: Payer: Self-pay | Admitting: Skilled Nursing Facility1

## 2022-02-17 NOTE — Telephone Encounter (Signed)
RD called pt to verify fluid intake once starting soft, solid proteins 2 week post-bariatric surgery.   Daily Fluid intake:  Daily Protein intake: Bowel Habits:   Concerns/issues:    LVM 

## 2022-03-03 DIAGNOSIS — M25552 Pain in left hip: Secondary | ICD-10-CM | POA: Diagnosis not present

## 2022-03-03 DIAGNOSIS — M25562 Pain in left knee: Secondary | ICD-10-CM | POA: Diagnosis not present

## 2022-03-03 DIAGNOSIS — M25551 Pain in right hip: Secondary | ICD-10-CM | POA: Diagnosis not present

## 2022-03-26 ENCOUNTER — Ambulatory Visit: Payer: Medicare HMO | Admitting: Skilled Nursing Facility1

## 2022-04-01 ENCOUNTER — Encounter: Payer: 59 | Attending: Surgery | Admitting: Skilled Nursing Facility1

## 2022-04-01 DIAGNOSIS — E7800 Pure hypercholesterolemia, unspecified: Secondary | ICD-10-CM | POA: Diagnosis not present

## 2022-04-01 DIAGNOSIS — E1169 Type 2 diabetes mellitus with other specified complication: Secondary | ICD-10-CM | POA: Insufficient documentation

## 2022-04-01 DIAGNOSIS — I10 Essential (primary) hypertension: Secondary | ICD-10-CM | POA: Insufficient documentation

## 2022-04-01 DIAGNOSIS — Z6841 Body Mass Index (BMI) 40.0 and over, adult: Secondary | ICD-10-CM | POA: Insufficient documentation

## 2022-04-01 DIAGNOSIS — I252 Old myocardial infarction: Secondary | ICD-10-CM | POA: Insufficient documentation

## 2022-04-01 DIAGNOSIS — Z794 Long term (current) use of insulin: Secondary | ICD-10-CM | POA: Diagnosis not present

## 2022-04-01 DIAGNOSIS — Z95818 Presence of other cardiac implants and grafts: Secondary | ICD-10-CM | POA: Diagnosis not present

## 2022-04-01 DIAGNOSIS — Z713 Dietary counseling and surveillance: Secondary | ICD-10-CM | POA: Diagnosis not present

## 2022-04-01 NOTE — Progress Notes (Signed)
Bariatric Nutrition Follow-Up Visit ?Medical Nutrition Therapy  ? ? ?NUTRITION ASSESSMENT ?  ? ?Anthropometrics  ?Surgery date: 01/28/2022 ?Surgery type: sleeve ?Start weight at [facility]: 412 ?Weight today: 376 pounds ? ? ?Clinical  ?Medical hx: HTN, sleep apnea, GERD, T2DM, bells palsy ?Medications: no longer taking tresiba, still taking metformin, taking farxiga   ?Labs:  ?Notable signs/symptoms: back aches, hip spur, walks with a cane ?Any previous deficiencies? No ?  ?Lifestyle & Dietary Hx ? ?Pt states he is frustrated his balance is not better yet. ?Pt states his legs feel weak. ?Pt states he is going to ask his PCP about a referral for for PT.  ?Pt states he had some nausea, stating brushing his teeth 3 times a day and mouthwash has helped.  ?Pt states his blood sugars have been about 135-148 if over 200 take tresiba.  ?Pt states he started cleaning his own house instead of paying someone. Pt states he uses cleaning his house as a workout. ?Pt states his surgeon advised he eat pasta. Pt states if he is going to add a carb it should be pasta instead of fruit and Mankin rice ?Pt states he is aware that kidney health is important, reflecting on friends that have had kidney disease. ? ?Estimated daily fluid intake: 32 oz ?Estimated daily protein intake: 120+ g ?Supplements:  ?Current average weekly physical activity: some walking  ? ?24-Hr Dietary Recall ?First Meal: american cheese + wheat crackers or 2 eggs + 1 cheese ?Snack:  protein shake ?Second Meal: cream of mushroom + pasta + cube steak ?Snack:  protein shake + pickled herring  ?Third Meal: cream of mushroom + pasta + cube steak or pickled herring  ?Snack: protein shake ?Beverages: Gatorade zero,  ? ?Post-Op Goals/ Signs/ Symptoms ?Using straws: no ?Drinking while eating: no ?Chewing/swallowing difficulties: no ?Changes in vision: no ?Changes to mood/headaches: no ?Hair loss/changes to skin/nails: no ?Difficulty focusing/concentrating: no ?Sweating: no ?Limb  weakness: on ?Dizziness/lightheadedness: no ?Palpitations: no  ?Carbonated/caffeinated beverages: no ?N/V/D/C/Gas: no ?Abdominal pain: no ?Dumping syndrome: no ? ?  ?NUTRITION DIAGNOSIS  ?Overweight/obesity (Erma-3.3) related to past poor dietary habits and physical inactivity as evidenced by completed bariatric surgery and following dietary guidelines for continued weight loss and healthy nutrition status. ?  ?  ?NUTRITION INTERVENTION ?Nutrition counseling (C-1) and education (E-2) to facilitate bariatric surgery goals, including: ?Diet advancement to all nutrient dense foods, as patient has self advanced;  Including starchy and non-starchy vegetables, fruits, and whole grains. ?The importance of consuming adequate calories as well as certain nutrients daily due to the body's need for essential vitamins, minerals, and fats ?The importance of daily physical activity and to reach a goal of at least 150 minutes of moderate to vigorous physical activity weekly (or as directed by their physician) due to benefits such as increased musculature and improved lab values ?The importance of intuitive eating specifically learning hunger-satiety cues and understanding the importance of learning a new body: The importance of mindful eating to avoid grazing behaviors  ?Purpose of hydration: Water makes up over 50% of your total body water, and is part of many organs throughout the body. Water is essential to transport digested nutrients, regulate body temperature, rid the body of waste products, and protects joints and the spinal cord. When not properly hydrated you will begin to experience headaches, cramps and dizziness. Further dehydration can result in rapid heart rate, shock, oliguria, and may cause seizures.   ?https://www.merckmanuals.com/home/hormonal-and-metabolic-disordehttps://www.usgs.gov/special-topic/water-science-school/science/water-you-water-and-human-body?qt-science_center_objects=0#qt-science_center_objectsrs/water-balance/about-body-water ?FriendLock.it ?InvestmentInstructor.com.cy ?FurEliminator.es ?https://www.health.BasicFM.no ?Why you need complex  carbohydrates: Whole grains and other complex carbohydrates are required to have a healthy diet. Whole grains provide fiber which can help with blood glucose levels and help keep you satiated. Fruits and starchy vegetables provide essential vitamins and minerals required for immune function, eyesight support, brain support, bone density, wound healing and many other functions within the body. According to the current evidenced based 2020-2025 Dietary Guidelines for Americans, complex carbohydrates are part of a healthy eating pattern which is associated with a decreased risk for type 2 diabetes, cancers, and cardiovascular disease.  ? ?Goals: ?-try the recumbent bike for 5 minutes a day you are not walking or cleaning  ?-30 oz plain water in addition to Gatorade zero; do not count protein shake as fluid, count as a protein supplement ?-Ask PCP about referral for Physical Therapy ?-Aim for 80-90 gm of protein per day ? ?Handouts Provided Include  ?Bariatric My Plate ? ?Learning Style & Readiness for Change ?Teaching method utilized: Visual & Auditory  ?Demonstrated degree of understanding via: Teach Back  ?Readiness Level: Contemplative  ?Barriers to learning/adherence to lifestyle change:  ? ?RD's Notes for Next Visit ?Assess adherence to pt chosen goals ? ? ?MONITORING & EVALUATION ?Dietary intake, weekly physical activity, body weight ? ?Next Steps ?Patient is  to follow-up in 3 months for 6 month post-op follow-up. ?

## 2022-04-15 ENCOUNTER — Encounter: Payer: Self-pay | Admitting: Podiatry

## 2022-04-15 ENCOUNTER — Ambulatory Visit (INDEPENDENT_AMBULATORY_CARE_PROVIDER_SITE_OTHER): Payer: Medicare HMO | Admitting: Podiatry

## 2022-04-15 DIAGNOSIS — E1142 Type 2 diabetes mellitus with diabetic polyneuropathy: Secondary | ICD-10-CM

## 2022-04-15 DIAGNOSIS — M79674 Pain in right toe(s): Secondary | ICD-10-CM

## 2022-04-15 DIAGNOSIS — B351 Tinea unguium: Secondary | ICD-10-CM

## 2022-04-15 DIAGNOSIS — M79675 Pain in left toe(s): Secondary | ICD-10-CM

## 2022-04-22 NOTE — Progress Notes (Signed)
?  Subjective:  ?Patient ID: Bryan Wells, male    DOB: 04/30/1961,  MRN: [id number] ? ?Bryan Wells presents to clinic today for at risk foot care with history of diabetic neuropathy and thick, elongated toenails b/l lower extremities which are tender when wearing enclosed shoe gear. ? ?Patient states blood glucose was 128 mg/dl today.   ? ?New problem(s): None.  ? ?PCP is Georgann Housekeeper, MD , and last visit was February 13, 2022. ? ?Patient states he had gastric sleeve performed in February. He is doing well. ? ?Allergies  ?Allergen Reactions  ? Ciprofloxacin Hives  ? Hydrochlorothiazide Swelling  ? Ibuprofen   ?  stomach upset  ? Lisinopril Swelling  ?  angioedma  ? Nsaids   ?  Other reaction(s): GI intole due  gastric bypass  ? Penicillins Other (See Comments)  ?  Loss of consciousness ? ?Has patient had a PCN reaction causing immediate rash, facial/tongue/throat swelling, SOB or lightheadedness with hypotension: Yes ?Has patient had a PCN reaction causing severe rash involving mucus membranes or skin necrosis: No ?Has patient had a PCN reaction that required hospitalization: Unknown ?Has patient had a PCN reaction occurring within the last 10 years: No ?If all of the above answers are "NO", then may proceed with Cephalosporin use. ? ?Other reaction(s): hives ?Other reaction(s): hives  ? Strawberry (Diagnostic) Itching  ? ? ?Review of Systems: Negative except as noted in the HPI. ? ?Objective: ? ?There were no vitals filed for this visit. ? ?Bryan Wells is a pleasant 61 y.o. male in NAD. AAO X 3. ? ?Vascular Examination: ?CFT <3 seconds b/l LE. Palpable DP/PT pulses b/l LE. Digital hair absent b/l. Skin temperature gradient WNL b/l. No pain with calf compression b/l. No edema noted b/l. No cyanosis or clubbing noted b/l LE. ? ?Dermatological Examination: ?Pedal integument with normal turgor, texture and tone b/l LE. No open wounds b/l. No interdigital macerations b/l. Toenails 1-5 b/l elongated, thickened,  discolored with subungual debris. +Tenderness with dorsal palpation of nailplates. No hyperkeratotic or porokeratotic lesions present. ? ?Musculoskeletal Examination: ?Muscle strength 5/5 to all lower extremity muscle groups bilaterally. No pain, crepitus or joint limitation noted with ROM bilateral LE. No gross bony deformities bilaterally. Pes planus deformity noted bilateral LE. Patient utilizes rollator for gait assistance. ? ?Neurological Examination: ?Protective sensation intact 5/5 intact bilaterally with 10g monofilament b/l. Vibratory sensation intact b/l. Proprioception intact bilaterally. ? ? ?  Latest Ref Rng & Units 01/20/2022  ?  1:29 PM  ?Hemoglobin A1C  ?Hemoglobin-A1c 4.8 - 5.6 % 9.1    ? ?Assessment/Plan: ?1. Pain due to onychomycosis of toenails of both feet   ?2. Diabetic peripheral neuropathy associated with type 2 diabetes mellitus (HCC)   ?  ? ?-Patient was evaluated and treated. All patient's and/or POA's questions/concerns answered on today's visit. ?-Continue diabetic foot care principles: inspect feet daily, monitor glucose as recommended by PCP and/or Endocrinologist, and follow prescribed diet per PCP, Endocrinologist and/or dietician. ?-Patient to continue soft, supportive shoe gear daily. ?-Toenails 1-5 b/l were debrided in length and girth with sterile nail nippers and dremel without iatrogenic bleeding.  ?-Patient/POA to call should there be question/concern in the interim.  ? ?Return in about 3 months (around 07/15/2022). ? ?Freddie Breech, DPM  ?

## 2022-04-23 DIAGNOSIS — Z125 Encounter for screening for malignant neoplasm of prostate: Secondary | ICD-10-CM | POA: Diagnosis not present

## 2022-04-23 DIAGNOSIS — I712 Thoracic aortic aneurysm, without rupture, unspecified: Secondary | ICD-10-CM | POA: Diagnosis not present

## 2022-04-23 DIAGNOSIS — I739 Peripheral vascular disease, unspecified: Secondary | ICD-10-CM | POA: Diagnosis not present

## 2022-04-23 DIAGNOSIS — I10 Essential (primary) hypertension: Secondary | ICD-10-CM | POA: Diagnosis not present

## 2022-04-23 DIAGNOSIS — E7800 Pure hypercholesterolemia, unspecified: Secondary | ICD-10-CM | POA: Diagnosis not present

## 2022-04-23 DIAGNOSIS — Z1331 Encounter for screening for depression: Secondary | ICD-10-CM | POA: Diagnosis not present

## 2022-04-23 DIAGNOSIS — E1169 Type 2 diabetes mellitus with other specified complication: Secondary | ICD-10-CM | POA: Diagnosis not present

## 2022-04-23 DIAGNOSIS — G4733 Obstructive sleep apnea (adult) (pediatric): Secondary | ICD-10-CM | POA: Diagnosis not present

## 2022-04-23 DIAGNOSIS — Z0000 Encounter for general adult medical examination without abnormal findings: Secondary | ICD-10-CM | POA: Diagnosis not present

## 2022-04-28 DIAGNOSIS — M1611 Unilateral primary osteoarthritis, right hip: Secondary | ICD-10-CM | POA: Diagnosis not present

## 2022-05-16 DIAGNOSIS — R260 Ataxic gait: Secondary | ICD-10-CM | POA: Diagnosis not present

## 2022-05-20 DIAGNOSIS — R260 Ataxic gait: Secondary | ICD-10-CM | POA: Diagnosis not present

## 2022-05-22 DIAGNOSIS — R260 Ataxic gait: Secondary | ICD-10-CM | POA: Diagnosis not present

## 2022-05-27 DIAGNOSIS — R260 Ataxic gait: Secondary | ICD-10-CM | POA: Diagnosis not present

## 2022-05-29 DIAGNOSIS — R260 Ataxic gait: Secondary | ICD-10-CM | POA: Diagnosis not present

## 2022-06-03 DIAGNOSIS — M1611 Unilateral primary osteoarthritis, right hip: Secondary | ICD-10-CM | POA: Diagnosis not present

## 2022-06-03 DIAGNOSIS — M25562 Pain in left knee: Secondary | ICD-10-CM | POA: Diagnosis not present

## 2022-06-03 DIAGNOSIS — M5106 Intervertebral disc disorders with myelopathy, lumbar region: Secondary | ICD-10-CM | POA: Diagnosis not present

## 2022-06-06 ENCOUNTER — Other Ambulatory Visit: Payer: Self-pay | Admitting: Sports Medicine

## 2022-06-06 ENCOUNTER — Other Ambulatory Visit (HOSPITAL_COMMUNITY): Payer: Self-pay | Admitting: Sports Medicine

## 2022-06-06 DIAGNOSIS — M5416 Radiculopathy, lumbar region: Secondary | ICD-10-CM

## 2022-06-06 DIAGNOSIS — M5126 Other intervertebral disc displacement, lumbar region: Secondary | ICD-10-CM

## 2022-06-16 ENCOUNTER — Ambulatory Visit (HOSPITAL_COMMUNITY)
Admission: RE | Admit: 2022-06-16 | Discharge: 2022-06-16 | Disposition: A | Discharge status: Home / Self Care | Payer: Medicare HMO | Source: Ambulatory Visit | Attending: Sports Medicine | Admitting: Sports Medicine

## 2022-06-16 ENCOUNTER — Encounter (HOSPITAL_COMMUNITY): Payer: Self-pay

## 2022-06-28 ENCOUNTER — Ambulatory Visit
Admission: RE | Admit: 2022-06-28 | Discharge: 2022-06-28 | Disposition: A | Discharge status: Home / Self Care | Payer: Medicare HMO | Source: Ambulatory Visit | Attending: Sports Medicine | Admitting: Sports Medicine

## 2022-06-28 DIAGNOSIS — M4316 Spondylolisthesis, lumbar region: Secondary | ICD-10-CM | POA: Diagnosis not present

## 2022-06-28 DIAGNOSIS — M5126 Other intervertebral disc displacement, lumbar region: Secondary | ICD-10-CM

## 2022-06-28 DIAGNOSIS — M5416 Radiculopathy, lumbar region: Secondary | ICD-10-CM

## 2022-07-01 ENCOUNTER — Ambulatory Visit: Payer: Medicare HMO | Admitting: Skilled Nursing Facility1

## 2022-07-02 DIAGNOSIS — M5416 Radiculopathy, lumbar region: Secondary | ICD-10-CM | POA: Diagnosis not present

## 2022-07-02 DIAGNOSIS — M25562 Pain in left knee: Secondary | ICD-10-CM | POA: Diagnosis not present

## 2022-07-02 DIAGNOSIS — M5106 Intervertebral disc disorders with myelopathy, lumbar region: Secondary | ICD-10-CM | POA: Diagnosis not present

## 2022-07-04 DIAGNOSIS — M5416 Radiculopathy, lumbar region: Secondary | ICD-10-CM | POA: Diagnosis not present

## 2022-07-22 ENCOUNTER — Other Ambulatory Visit: Payer: Self-pay | Admitting: Physical Medicine and Rehabilitation

## 2022-07-22 DIAGNOSIS — M47816 Spondylosis without myelopathy or radiculopathy, lumbar region: Secondary | ICD-10-CM

## 2022-07-22 DIAGNOSIS — M5416 Radiculopathy, lumbar region: Secondary | ICD-10-CM

## 2022-07-24 ENCOUNTER — Ambulatory Visit
Admission: RE | Admit: 2022-07-24 | Discharge: 2022-07-24 | Disposition: A | Discharge status: Home / Self Care | Payer: Medicare HMO | Source: Ambulatory Visit | Attending: Physical Medicine and Rehabilitation | Admitting: Physical Medicine and Rehabilitation

## 2022-07-24 DIAGNOSIS — M47816 Spondylosis without myelopathy or radiculopathy, lumbar region: Secondary | ICD-10-CM

## 2022-07-24 DIAGNOSIS — M47817 Spondylosis without myelopathy or radiculopathy, lumbosacral region: Secondary | ICD-10-CM | POA: Diagnosis not present

## 2022-07-24 DIAGNOSIS — M5416 Radiculopathy, lumbar region: Secondary | ICD-10-CM

## 2022-07-24 MED ORDER — METHYLPREDNISOLONE ACETATE 40 MG/ML INJ SUSP (RADIOLOG
80.0000 mg | Freq: Once | INTRAMUSCULAR | Status: AC
  Administered 2022-07-24: 80 mg via EPIDURAL

## 2022-07-24 MED ORDER — IOPAMIDOL (ISOVUE-M 200) INJECTION 41%
1.0000 mL | Freq: Once | INTRAMUSCULAR | Status: AC
  Administered 2022-07-24: 1 mL via EPIDURAL

## 2022-07-24 NOTE — Discharge Instructions (Signed)

## 2022-08-19 DIAGNOSIS — E669 Obesity, unspecified: Secondary | ICD-10-CM | POA: Diagnosis not present

## 2022-08-19 DIAGNOSIS — Z6841 Body Mass Index (BMI) 40.0 and over, adult: Secondary | ICD-10-CM | POA: Diagnosis not present

## 2022-08-19 DIAGNOSIS — I10 Essential (primary) hypertension: Secondary | ICD-10-CM | POA: Diagnosis not present

## 2022-08-19 DIAGNOSIS — E119 Type 2 diabetes mellitus without complications: Secondary | ICD-10-CM | POA: Diagnosis not present

## 2022-08-19 DIAGNOSIS — R030 Elevated blood-pressure reading, without diagnosis of hypertension: Secondary | ICD-10-CM | POA: Diagnosis not present

## 2022-08-19 DIAGNOSIS — Z79899 Other long term (current) drug therapy: Secondary | ICD-10-CM | POA: Diagnosis not present

## 2022-08-19 DIAGNOSIS — Z0000 Encounter for general adult medical examination without abnormal findings: Secondary | ICD-10-CM | POA: Diagnosis not present

## 2022-08-19 DIAGNOSIS — M25562 Pain in left knee: Secondary | ICD-10-CM | POA: Diagnosis not present

## 2022-08-19 DIAGNOSIS — Z1159 Encounter for screening for other viral diseases: Secondary | ICD-10-CM | POA: Diagnosis not present

## 2022-08-19 DIAGNOSIS — M129 Arthropathy, unspecified: Secondary | ICD-10-CM | POA: Diagnosis not present

## 2022-09-02 DIAGNOSIS — Z9181 History of falling: Secondary | ICD-10-CM | POA: Diagnosis not present

## 2022-09-02 DIAGNOSIS — E119 Type 2 diabetes mellitus without complications: Secondary | ICD-10-CM | POA: Diagnosis not present

## 2022-09-02 DIAGNOSIS — Z6841 Body Mass Index (BMI) 40.0 and over, adult: Secondary | ICD-10-CM | POA: Diagnosis not present

## 2022-09-02 DIAGNOSIS — M25562 Pain in left knee: Secondary | ICD-10-CM | POA: Diagnosis not present

## 2022-09-02 DIAGNOSIS — I10 Essential (primary) hypertension: Secondary | ICD-10-CM | POA: Diagnosis not present

## 2022-09-02 DIAGNOSIS — Z79899 Other long term (current) drug therapy: Secondary | ICD-10-CM | POA: Diagnosis not present

## 2022-09-02 DIAGNOSIS — K5900 Constipation, unspecified: Secondary | ICD-10-CM | POA: Diagnosis not present

## 2022-09-05 ENCOUNTER — Encounter: Payer: Self-pay | Admitting: Podiatry

## 2022-09-05 ENCOUNTER — Ambulatory Visit (INDEPENDENT_AMBULATORY_CARE_PROVIDER_SITE_OTHER): Payer: Medicare HMO | Admitting: Podiatry

## 2022-09-05 DIAGNOSIS — M2141 Flat foot [pes planus] (acquired), right foot: Secondary | ICD-10-CM

## 2022-09-05 DIAGNOSIS — E1142 Type 2 diabetes mellitus with diabetic polyneuropathy: Secondary | ICD-10-CM | POA: Diagnosis not present

## 2022-09-05 DIAGNOSIS — B351 Tinea unguium: Secondary | ICD-10-CM | POA: Diagnosis not present

## 2022-09-05 DIAGNOSIS — M2142 Flat foot [pes planus] (acquired), left foot: Secondary | ICD-10-CM | POA: Diagnosis not present

## 2022-09-05 DIAGNOSIS — M79674 Pain in right toe(s): Secondary | ICD-10-CM | POA: Diagnosis not present

## 2022-09-05 DIAGNOSIS — M79675 Pain in left toe(s): Secondary | ICD-10-CM | POA: Diagnosis not present

## 2022-09-05 NOTE — Progress Notes (Signed)
This patient returns to my office for at risk foot care.  This patient requires this care by a professional since this patient will be at risk due to having diabetes.  Patient has had surgery for weight control.   This patient is unable to cut nails himself since the patient cannot reach his nails.These nails are painful walking and wearing shoes.  This patient presents for at risk foot care today.  General Appearance  Alert, conversant and in no acute stress.  Vascular  Dorsalis pedis and posterior tibial  pulses are palpable  bilaterally.  Capillary return is within normal limits  bilaterally. Temperature is within normal limits  bilaterally.  Neurologic  Senn-Weinstein monofilament wire test within normal limits  bilaterally. Muscle power within normal limits bilaterally.  Nails Thick disfigured discolored nails with subungual debris  from hallux to fifth toes bilaterally. No evidence of bacterial infection or drainage bilaterally.  Orthopedic  No limitations of motion  feet .  No crepitus or effusions noted.  No bony pathology or digital deformities noted.  Skin  normotropic skin with no porokeratosis noted bilaterally.  No signs of infections or ulcers noted.     Onychomycosis  Pain in right toes  Pain in left toes  Consent was obtained for treatment procedures.   Mechanical debridement of nails 1-5  bilaterally performed with a nail nipper.  Filed with dremel without incident.    Return office visit   3 months                   Told patient to return for periodic foot care and evaluation due to potential at risk complications.   Helane Gunther DPM

## 2022-09-19 ENCOUNTER — Other Ambulatory Visit: Payer: Self-pay | Admitting: Chiropractor

## 2022-09-19 ENCOUNTER — Ambulatory Visit
Admission: RE | Admit: 2022-09-19 | Discharge: 2022-09-19 | Disposition: A | Discharge status: Home / Self Care | Payer: Medicare HMO | Source: Ambulatory Visit | Attending: Chiropractor | Admitting: Chiropractor

## 2022-09-19 DIAGNOSIS — M4316 Spondylolisthesis, lumbar region: Secondary | ICD-10-CM | POA: Diagnosis not present

## 2022-09-19 DIAGNOSIS — M5440 Lumbago with sciatica, unspecified side: Secondary | ICD-10-CM

## 2022-09-19 DIAGNOSIS — M545 Low back pain, unspecified: Secondary | ICD-10-CM | POA: Diagnosis not present

## 2022-09-24 ENCOUNTER — Other Ambulatory Visit: Payer: Self-pay | Admitting: Chiropractor

## 2022-09-24 ENCOUNTER — Ambulatory Visit
Admission: RE | Admit: 2022-09-24 | Discharge: 2022-09-24 | Disposition: A | Discharge status: Home / Self Care | Payer: Medicare HMO | Source: Ambulatory Visit | Attending: Chiropractor | Admitting: Chiropractor

## 2022-09-24 DIAGNOSIS — M545 Low back pain, unspecified: Secondary | ICD-10-CM | POA: Diagnosis not present

## 2022-09-24 DIAGNOSIS — R102 Pelvic and perineal pain: Secondary | ICD-10-CM | POA: Diagnosis not present

## 2022-09-30 DIAGNOSIS — M9903 Segmental and somatic dysfunction of lumbar region: Secondary | ICD-10-CM | POA: Diagnosis not present

## 2022-09-30 DIAGNOSIS — M5137 Other intervertebral disc degeneration, lumbosacral region: Secondary | ICD-10-CM | POA: Diagnosis not present

## 2022-09-30 DIAGNOSIS — M9905 Segmental and somatic dysfunction of pelvic region: Secondary | ICD-10-CM | POA: Diagnosis not present

## 2022-09-30 DIAGNOSIS — M25551 Pain in right hip: Secondary | ICD-10-CM | POA: Diagnosis not present

## 2022-10-02 DIAGNOSIS — Z79899 Other long term (current) drug therapy: Secondary | ICD-10-CM | POA: Diagnosis not present

## 2022-10-02 DIAGNOSIS — I509 Heart failure, unspecified: Secondary | ICD-10-CM | POA: Diagnosis not present

## 2022-10-02 DIAGNOSIS — I10 Essential (primary) hypertension: Secondary | ICD-10-CM | POA: Diagnosis not present

## 2022-10-02 DIAGNOSIS — I110 Hypertensive heart disease with heart failure: Secondary | ICD-10-CM | POA: Diagnosis not present

## 2022-10-02 DIAGNOSIS — Z6841 Body Mass Index (BMI) 40.0 and over, adult: Secondary | ICD-10-CM | POA: Diagnosis not present

## 2022-10-02 DIAGNOSIS — M25562 Pain in left knee: Secondary | ICD-10-CM | POA: Diagnosis not present

## 2022-10-02 DIAGNOSIS — E119 Type 2 diabetes mellitus without complications: Secondary | ICD-10-CM | POA: Diagnosis not present

## 2022-10-06 ENCOUNTER — Ambulatory Visit: Payer: Medicare HMO | Admitting: Podiatry

## 2022-10-06 DIAGNOSIS — M9903 Segmental and somatic dysfunction of lumbar region: Secondary | ICD-10-CM | POA: Diagnosis not present

## 2022-10-06 DIAGNOSIS — M5137 Other intervertebral disc degeneration, lumbosacral region: Secondary | ICD-10-CM | POA: Diagnosis not present

## 2022-10-06 DIAGNOSIS — M9905 Segmental and somatic dysfunction of pelvic region: Secondary | ICD-10-CM | POA: Diagnosis not present

## 2022-10-06 DIAGNOSIS — M25551 Pain in right hip: Secondary | ICD-10-CM | POA: Diagnosis not present

## 2022-10-15 DIAGNOSIS — M25562 Pain in left knee: Secondary | ICD-10-CM | POA: Diagnosis not present

## 2022-10-16 ENCOUNTER — Other Ambulatory Visit: Payer: Self-pay | Admitting: Physician Assistant

## 2022-10-16 DIAGNOSIS — M25562 Pain in left knee: Secondary | ICD-10-CM

## 2022-10-20 DIAGNOSIS — R260 Ataxic gait: Secondary | ICD-10-CM | POA: Diagnosis not present

## 2022-10-28 DIAGNOSIS — R260 Ataxic gait: Secondary | ICD-10-CM | POA: Diagnosis not present

## 2022-10-30 ENCOUNTER — Ambulatory Visit
Admission: RE | Admit: 2022-10-30 | Discharge: 2022-10-30 | Disposition: A | Discharge status: Home / Self Care | Payer: Medicare HMO | Source: Ambulatory Visit | Attending: Physician Assistant | Admitting: Physician Assistant

## 2022-10-30 DIAGNOSIS — M25562 Pain in left knee: Secondary | ICD-10-CM

## 2022-11-02 ENCOUNTER — Ambulatory Visit
Admission: RE | Admit: 2022-11-02 | Discharge: 2022-11-02 | Disposition: A | Discharge status: Home / Self Care | Payer: Medicare HMO | Source: Ambulatory Visit | Attending: Physician Assistant | Admitting: Physician Assistant

## 2022-11-02 DIAGNOSIS — M8548 Solitary bone cyst, other site: Secondary | ICD-10-CM | POA: Diagnosis not present

## 2022-11-02 DIAGNOSIS — M1712 Unilateral primary osteoarthritis, left knee: Secondary | ICD-10-CM | POA: Diagnosis not present

## 2022-11-02 DIAGNOSIS — M25462 Effusion, left knee: Secondary | ICD-10-CM | POA: Diagnosis not present

## 2022-11-02 DIAGNOSIS — S83242A Other tear of medial meniscus, current injury, left knee, initial encounter: Secondary | ICD-10-CM | POA: Diagnosis not present

## 2022-11-03 DIAGNOSIS — Z79899 Other long term (current) drug therapy: Secondary | ICD-10-CM | POA: Diagnosis not present

## 2022-11-03 DIAGNOSIS — I10 Essential (primary) hypertension: Secondary | ICD-10-CM | POA: Diagnosis not present

## 2022-11-03 DIAGNOSIS — Z9181 History of falling: Secondary | ICD-10-CM | POA: Diagnosis not present

## 2022-11-03 DIAGNOSIS — M25562 Pain in left knee: Secondary | ICD-10-CM | POA: Diagnosis not present

## 2022-11-03 DIAGNOSIS — E119 Type 2 diabetes mellitus without complications: Secondary | ICD-10-CM | POA: Diagnosis not present

## 2022-11-03 DIAGNOSIS — I110 Hypertensive heart disease with heart failure: Secondary | ICD-10-CM | POA: Diagnosis not present

## 2022-11-03 DIAGNOSIS — Z6841 Body Mass Index (BMI) 40.0 and over, adult: Secondary | ICD-10-CM | POA: Diagnosis not present

## 2022-11-03 DIAGNOSIS — I509 Heart failure, unspecified: Secondary | ICD-10-CM | POA: Diagnosis not present

## 2022-11-05 DIAGNOSIS — S83232D Complex tear of medial meniscus, current injury, left knee, subsequent encounter: Secondary | ICD-10-CM | POA: Diagnosis not present

## 2022-11-05 DIAGNOSIS — M79652 Pain in left thigh: Secondary | ICD-10-CM | POA: Diagnosis not present

## 2022-11-05 DIAGNOSIS — M545 Low back pain, unspecified: Secondary | ICD-10-CM | POA: Diagnosis not present

## 2022-11-05 DIAGNOSIS — M25562 Pain in left knee: Secondary | ICD-10-CM | POA: Diagnosis not present

## 2022-11-21 DIAGNOSIS — M545 Low back pain, unspecified: Secondary | ICD-10-CM | POA: Diagnosis not present

## 2022-11-24 DIAGNOSIS — M545 Low back pain, unspecified: Secondary | ICD-10-CM | POA: Diagnosis not present

## 2022-11-26 DIAGNOSIS — M545 Low back pain, unspecified: Secondary | ICD-10-CM | POA: Diagnosis not present

## 2022-12-01 DIAGNOSIS — M545 Low back pain, unspecified: Secondary | ICD-10-CM | POA: Diagnosis not present

## 2022-12-05 DIAGNOSIS — Z79899 Other long term (current) drug therapy: Secondary | ICD-10-CM | POA: Diagnosis not present

## 2022-12-05 DIAGNOSIS — M545 Low back pain, unspecified: Secondary | ICD-10-CM | POA: Diagnosis not present

## 2022-12-05 DIAGNOSIS — E119 Type 2 diabetes mellitus without complications: Secondary | ICD-10-CM | POA: Diagnosis not present

## 2022-12-05 DIAGNOSIS — M25562 Pain in left knee: Secondary | ICD-10-CM | POA: Diagnosis not present

## 2022-12-09 DIAGNOSIS — Z79899 Other long term (current) drug therapy: Secondary | ICD-10-CM | POA: Diagnosis not present

## 2022-12-10 DIAGNOSIS — M545 Low back pain, unspecified: Secondary | ICD-10-CM | POA: Diagnosis not present

## 2022-12-17 DIAGNOSIS — M545 Low back pain, unspecified: Secondary | ICD-10-CM | POA: Diagnosis not present

## 2022-12-24 DIAGNOSIS — M545 Low back pain, unspecified: Secondary | ICD-10-CM | POA: Diagnosis not present

## 2022-12-29 DIAGNOSIS — M545 Low back pain, unspecified: Secondary | ICD-10-CM | POA: Diagnosis not present

## 2023-01-02 DIAGNOSIS — I509 Heart failure, unspecified: Secondary | ICD-10-CM | POA: Diagnosis not present

## 2023-01-02 DIAGNOSIS — E119 Type 2 diabetes mellitus without complications: Secondary | ICD-10-CM | POA: Diagnosis not present

## 2023-01-02 DIAGNOSIS — Z79899 Other long term (current) drug therapy: Secondary | ICD-10-CM | POA: Diagnosis not present

## 2023-01-02 DIAGNOSIS — M25562 Pain in left knee: Secondary | ICD-10-CM | POA: Diagnosis not present

## 2023-02-02 DIAGNOSIS — I714 Abdominal aortic aneurysm, without rupture, unspecified: Secondary | ICD-10-CM | POA: Diagnosis not present

## 2023-02-02 DIAGNOSIS — M25562 Pain in left knee: Secondary | ICD-10-CM | POA: Diagnosis not present

## 2023-02-02 DIAGNOSIS — Z79899 Other long term (current) drug therapy: Secondary | ICD-10-CM | POA: Diagnosis not present

## 2023-02-02 DIAGNOSIS — Z6841 Body Mass Index (BMI) 40.0 and over, adult: Secondary | ICD-10-CM | POA: Diagnosis not present

## 2023-02-02 DIAGNOSIS — E1165 Type 2 diabetes mellitus with hyperglycemia: Secondary | ICD-10-CM | POA: Diagnosis not present

## 2023-02-02 DIAGNOSIS — E119 Type 2 diabetes mellitus without complications: Secondary | ICD-10-CM | POA: Diagnosis not present

## 2023-02-02 DIAGNOSIS — I5021 Acute systolic (congestive) heart failure: Secondary | ICD-10-CM | POA: Diagnosis not present

## 2023-02-02 DIAGNOSIS — M545 Low back pain, unspecified: Secondary | ICD-10-CM | POA: Diagnosis not present

## 2023-02-05 DIAGNOSIS — Z79899 Other long term (current) drug therapy: Secondary | ICD-10-CM | POA: Diagnosis not present

## 2023-02-12 DIAGNOSIS — M545 Low back pain, unspecified: Secondary | ICD-10-CM | POA: Diagnosis not present

## 2023-02-13 DIAGNOSIS — M545 Low back pain, unspecified: Secondary | ICD-10-CM | POA: Diagnosis not present

## 2023-02-20 ENCOUNTER — Encounter: Payer: Self-pay | Admitting: Podiatry

## 2023-02-20 ENCOUNTER — Ambulatory Visit (INDEPENDENT_AMBULATORY_CARE_PROVIDER_SITE_OTHER): Payer: Medicare HMO | Admitting: Podiatry

## 2023-02-20 DIAGNOSIS — B351 Tinea unguium: Secondary | ICD-10-CM

## 2023-02-20 DIAGNOSIS — M79675 Pain in left toe(s): Secondary | ICD-10-CM

## 2023-02-20 DIAGNOSIS — M79674 Pain in right toe(s): Secondary | ICD-10-CM | POA: Diagnosis not present

## 2023-02-20 DIAGNOSIS — E1142 Type 2 diabetes mellitus with diabetic polyneuropathy: Secondary | ICD-10-CM

## 2023-02-20 DIAGNOSIS — M545 Low back pain, unspecified: Secondary | ICD-10-CM | POA: Diagnosis not present

## 2023-02-20 NOTE — Progress Notes (Signed)
This patient returns to my office for at risk foot care.  This patient requires this care by a professional since this patient will be at risk due to having diabetes.  Patient has had surgery for weight control.   This patient is unable to cut nails himself since the patient cannot reach his nails.These nails are painful walking and wearing shoes.  This patient presents for at risk foot care today.  General Appearance  Alert, conversant and in no acute stress.  Vascular  Dorsalis pedis and posterior tibial  pulses are   not palpable  due to swelling. bilaterally.  Capillary return is within normal limits  bilaterally. Temperature is within normal limits  bilaterally.  Neurologic  Senn-Weinstein monofilament wire test within normal limits  bilaterally. Muscle power within normal limits bilaterally.  Nails Thick disfigured discolored nails with subungual debris  from hallux to fifth toes bilaterally. No evidence of bacterial infection or drainage bilaterally.  Orthopedic  No limitations of motion  feet .  No crepitus or effusions noted.  No bony pathology or digital deformities noted.  Skin  normotropic skin with no porokeratosis noted bilaterally.  No signs of infections or ulcers noted.     Onychomycosis  Pain in right toes  Pain in left toes  Consent was obtained for treatment procedures.   Mechanical debridement of nails 1-5  bilaterally performed with a nail nipper.  Filed with dremel without incident.    Return office visit   3 months                   Told patient to return for periodic foot care and evaluation due to potential at risk complications.   Gardiner Barefoot DPM

## 2023-02-25 DIAGNOSIS — M545 Low back pain, unspecified: Secondary | ICD-10-CM | POA: Diagnosis not present

## 2023-03-02 DIAGNOSIS — E119 Type 2 diabetes mellitus without complications: Secondary | ICD-10-CM | POA: Diagnosis not present

## 2023-03-02 DIAGNOSIS — Z79899 Other long term (current) drug therapy: Secondary | ICD-10-CM | POA: Diagnosis not present

## 2023-03-02 DIAGNOSIS — E1165 Type 2 diabetes mellitus with hyperglycemia: Secondary | ICD-10-CM | POA: Diagnosis not present

## 2023-03-02 DIAGNOSIS — M545 Low back pain, unspecified: Secondary | ICD-10-CM | POA: Diagnosis not present

## 2023-03-02 DIAGNOSIS — I5021 Acute systolic (congestive) heart failure: Secondary | ICD-10-CM | POA: Diagnosis not present

## 2023-03-02 DIAGNOSIS — M25562 Pain in left knee: Secondary | ICD-10-CM | POA: Diagnosis not present

## 2023-03-02 DIAGNOSIS — I714 Abdominal aortic aneurysm, without rupture, unspecified: Secondary | ICD-10-CM | POA: Diagnosis not present

## 2023-03-02 DIAGNOSIS — Z6841 Body Mass Index (BMI) 40.0 and over, adult: Secondary | ICD-10-CM | POA: Diagnosis not present

## 2023-03-05 DIAGNOSIS — Z79899 Other long term (current) drug therapy: Secondary | ICD-10-CM | POA: Diagnosis not present

## 2023-03-11 DIAGNOSIS — M47816 Spondylosis without myelopathy or radiculopathy, lumbar region: Secondary | ICD-10-CM | POA: Diagnosis not present

## 2023-03-11 DIAGNOSIS — M5416 Radiculopathy, lumbar region: Secondary | ICD-10-CM | POA: Diagnosis not present

## 2023-03-11 DIAGNOSIS — G8929 Other chronic pain: Secondary | ICD-10-CM | POA: Diagnosis not present

## 2023-03-11 DIAGNOSIS — Z79891 Long term (current) use of opiate analgesic: Secondary | ICD-10-CM | POA: Diagnosis not present

## 2023-03-11 DIAGNOSIS — M533 Sacrococcygeal disorders, not elsewhere classified: Secondary | ICD-10-CM | POA: Diagnosis not present

## 2023-04-02 DIAGNOSIS — Z6841 Body Mass Index (BMI) 40.0 and over, adult: Secondary | ICD-10-CM | POA: Diagnosis not present

## 2023-04-02 DIAGNOSIS — Z79899 Other long term (current) drug therapy: Secondary | ICD-10-CM | POA: Diagnosis not present

## 2023-04-02 DIAGNOSIS — I714 Abdominal aortic aneurysm, without rupture, unspecified: Secondary | ICD-10-CM | POA: Diagnosis not present

## 2023-04-02 DIAGNOSIS — M545 Low back pain, unspecified: Secondary | ICD-10-CM | POA: Diagnosis not present

## 2023-04-02 DIAGNOSIS — E119 Type 2 diabetes mellitus without complications: Secondary | ICD-10-CM | POA: Diagnosis not present

## 2023-04-02 DIAGNOSIS — M25562 Pain in left knee: Secondary | ICD-10-CM | POA: Diagnosis not present

## 2023-04-02 DIAGNOSIS — I5021 Acute systolic (congestive) heart failure: Secondary | ICD-10-CM | POA: Diagnosis not present

## 2023-04-06 DIAGNOSIS — G8929 Other chronic pain: Secondary | ICD-10-CM | POA: Diagnosis not present

## 2023-04-06 DIAGNOSIS — Z79899 Other long term (current) drug therapy: Secondary | ICD-10-CM | POA: Diagnosis not present

## 2023-04-06 DIAGNOSIS — M25562 Pain in left knee: Secondary | ICD-10-CM | POA: Diagnosis not present

## 2023-04-06 DIAGNOSIS — M5416 Radiculopathy, lumbar region: Secondary | ICD-10-CM | POA: Diagnosis not present

## 2023-04-08 ENCOUNTER — Other Ambulatory Visit: Payer: Self-pay | Admitting: Internal Medicine

## 2023-04-08 DIAGNOSIS — M5136 Other intervertebral disc degeneration, lumbar region: Secondary | ICD-10-CM

## 2023-04-12 NOTE — Progress Notes (Unsigned)
Office Visit Note   Patient: Bryan Wells           Date of Birth: Apr 21, 1961           MRN: [id number] Visit Date: 04/14/2023              Requested by: Georgann Housekeeper, MD [address] [place],  [place] [address] PCP: Georgann Housekeeper, MD   Assessment & Plan: Visit Diagnoses: No diagnosis found.  Plan: ***  Follow-Up Instructions: No follow-ups on file.   Orders:  No orders of the defined types were placed in this encounter.  No orders of the defined types were placed in this encounter.     Procedures: No procedures performed   Clinical Data: No additional findings.   Subjective: No chief complaint on file.   HPI  Review of Systems  Constitutional: Negative.   HENT: Negative.    Eyes: Negative.   Respiratory: Negative.    Cardiovascular: Negative.   Gastrointestinal: Negative.   Endocrine: Negative.   Genitourinary: Negative.   Skin: Negative.   Allergic/Immunologic: Negative.   Neurological: Negative.   Hematological: Negative.   Psychiatric/Behavioral: Negative.    All other systems reviewed and are negative.   Objective: Vital Signs: There were no vitals taken for this visit.  Physical Exam Vitals and nursing note reviewed.  Constitutional:      Appearance: He is well-developed.  HENT:     Head: Normocephalic and atraumatic.  Eyes:     Pupils: Pupils are equal, round, and reactive to light.  Pulmonary:     Effort: Pulmonary effort is normal.  Abdominal:     Palpations: Abdomen is soft.  Musculoskeletal:        General: Normal range of motion.     Cervical back: Neck supple.  Skin:    General: Skin is warm.  Neurological:     Mental Status: He is alert and oriented to person, place, and time.  Psychiatric:        Behavior: Behavior normal.        Thought Content: Thought content normal.        Judgment: Judgment normal.   Ortho Exam  Specialty Comments:  No specialty comments available.  Imaging: No results  found.   PMFS History: Patient Active Problem List   Diagnosis Date Noted  . S/P laparoscopic sleeve gastrectomy 01/28/2022  . Anxiety 01/14/2022  . Chronic pain 01/14/2022  . History of dissection of thoracic aorta 07/23/2021  . Ruptured aneurysm of thoracoabdominal aorta 07/23/2021  . Hypertension associated with diabetes 07/23/2021  . Aortic atherosclerosis 07/23/2021  . Coronary artery calcification 07/23/2021  . Dissection of thoracic aorta 06/13/2021  . Dyslipidemia 06/13/2021  . Dysphagia 06/13/2021  . Gastro-esophageal reflux disease without esophagitis 06/13/2021  . Long term (current) use of insulin 06/13/2021  . Osteoarthritis of knee 06/13/2021  . Pure hypercholesterolemia 06/13/2021  . Thoracic aortic aneurysm without rupture 06/13/2021  . Type 2 diabetes mellitus with other specified complication 06/13/2021  . History of Bell's palsy 02/28/2020  . Sudden left hearing loss 02/28/2020  . Tinnitus of left ear 02/28/2020  . Noncompliance with diabetes treatment 10/02/2018  . Bell's palsy 08/21/2017  . Hyperlipidemia 08/21/2017  . Morbid obesity with BMI of 50.0-59.9, adult 08/21/2017  . Sleep apnea 08/21/2017  . Type 2 diabetes mellitus with hyperglycemia 08/21/2017  . SINUSITIS, CHRONIC 12/11/2009  . ERECTILE DYSFUNCTION, ORGANIC 12/11/2009  . MORBID OBESITY 12/10/2009  . DEPRESSION 12/10/2009  . ULNAR NERVE  ENTRAPMENT 12/10/2009  . AORTIC ANEURYSM 12/10/2009  . HYPERTENSION NEC 12/10/2009  . History of AAA (abdominal aortic aneurysm) repair 12/10/2009  . Hypertension 12/10/2009   Past Medical History:  Diagnosis Date  . AAA (abdominal aortic aneurysm) (HCC)    followed by vascular -- dr Jenne Pane-- infrarenal AAA and proximal descending thoracic   . Anxiety   . Bell's palsy 05/2017   left side-- per pt residual mild stiffness  . Depression   . GERD (gastroesophageal reflux disease)   . History of aortic dissection 09/2008   type B  s/p  stenting BMS's  distal descending thoracic into bilateral iliac arteries  . History of MI (myocardial infarction)    per vascular note in epic,  post op complicattion aortic/ iliac stenting MI with tracheostomy for prolonged vent.  . Hyperlipidemia    hypercholesterolemia  . Hypertension   . Morbid obesity with BMI of 50.0-59.9, adult (HCC)   . OSA on CPAP   . Type 2 diabetes mellitus treated with insulin (HCC)    followed by pcp  . Wears dentures    upper  . Wears glasses     Family History  Problem Relation Age of Onset  . Diabetes Father   . Hyperlipidemia Sister   . Hypertension Sister   . Obesity Sister   . Coronary artery disease Sister   . Hyperlipidemia Brother     Past Surgical History:  Procedure Laterality Date  . COLONOSCOPY WITH PROPOFOL N/A 02/22/2019   Procedure: COLONOSCOPY WITH PROPOFOL;  Surgeon: Graylin Shiver, MD;  Location: [facility];  Service: Endoscopy;  Laterality: N/A;  . DESCENDING AORTIC ANEURYSM REPAIR W/ STENT  10/ 2009  in [place]   BMS x2 distal portion extending into bilateral common iliac arteries  . ESOPHAGOGASTRODUODENOSCOPY (EGD) WITH PROPOFOL N/A 02/22/2019   Procedure: ESOPHAGOGASTRODUODENOSCOPY (EGD) WITH PROPOFOL;  Surgeon: Graylin Shiver, MD;  Location: [facility];  Service: Endoscopy;  Laterality: N/A;  . UPPER GI ENDOSCOPY N/A 01/28/2022   Procedure: UPPER GI ENDOSCOPY;  Surgeon: Luretha Murphy, MD;  Location: [facility];  Service: General;  Laterality: N/A;   Social History   Occupational History  . Not on file  Tobacco Use  . Smoking status: Former    Years: 20    Types: Cigarettes    Quit date: 02/21/2012    Years since quitting: 11.1  . Smokeless tobacco: Never  Vaping Use  . Vaping Use: Never used  Substance and Sexual Activity  . Alcohol use: No  . Drug use: Never  . Sexual activity: Not on file

## 2023-04-14 ENCOUNTER — Encounter: Payer: Self-pay | Admitting: Orthopaedic Surgery

## 2023-04-14 ENCOUNTER — Ambulatory Visit: Payer: Medicare HMO | Admitting: Orthopaedic Surgery

## 2023-04-14 ENCOUNTER — Other Ambulatory Visit (INDEPENDENT_AMBULATORY_CARE_PROVIDER_SITE_OTHER): Payer: Medicare HMO

## 2023-04-14 ENCOUNTER — Telehealth: Payer: Self-pay

## 2023-04-14 VITALS — Ht 72.0 in | Wt 387.0 lb

## 2023-04-14 DIAGNOSIS — Z6841 Body Mass Index (BMI) 40.0 and over, adult: Secondary | ICD-10-CM

## 2023-04-14 DIAGNOSIS — M25562 Pain in left knee: Secondary | ICD-10-CM | POA: Diagnosis not present

## 2023-04-14 DIAGNOSIS — G8929 Other chronic pain: Secondary | ICD-10-CM

## 2023-04-14 MED ORDER — BUPIVACAINE HCL 0.5 % IJ SOLN
2.0000 mL | INTRAMUSCULAR | Status: AC | PRN
  Administered 2023-04-14: 2 mL via INTRA_ARTICULAR

## 2023-04-14 MED ORDER — LIDOCAINE HCL 1 % IJ SOLN
2.0000 mL | INTRAMUSCULAR | Status: AC | PRN
  Administered 2023-04-14: 2 mL

## 2023-04-14 MED ORDER — METHYLPREDNISOLONE ACETATE 40 MG/ML IJ SUSP
40.0000 mg | INTRAMUSCULAR | Status: AC | PRN
  Administered 2023-04-14: 40 mg via INTRA_ARTICULAR

## 2023-04-14 NOTE — Telephone Encounter (Signed)
Please precert for left knee gel injection. Dr.Xu's patient. Thanks!  

## 2023-04-14 NOTE — Discharge Instructions (Signed)

## 2023-04-15 ENCOUNTER — Ambulatory Visit
Admission: RE | Admit: 2023-04-15 | Discharge: 2023-04-15 | Disposition: A | Discharge status: Home / Self Care | Payer: Medicare HMO | Source: Ambulatory Visit | Attending: Internal Medicine | Admitting: Internal Medicine

## 2023-04-15 DIAGNOSIS — M47817 Spondylosis without myelopathy or radiculopathy, lumbosacral region: Secondary | ICD-10-CM | POA: Diagnosis not present

## 2023-04-15 DIAGNOSIS — M5136 Other intervertebral disc degeneration, lumbar region: Secondary | ICD-10-CM

## 2023-04-15 MED ORDER — IOPAMIDOL (ISOVUE-M 200) INJECTION 41%
1.0000 mL | Freq: Once | INTRAMUSCULAR | Status: AC
  Administered 2023-04-15: 1 mL via EPIDURAL

## 2023-04-15 MED ORDER — METHYLPREDNISOLONE ACETATE 40 MG/ML INJ SUSP (RADIOLOG
80.0000 mg | Freq: Once | INTRAMUSCULAR | Status: AC
  Administered 2023-04-15: 80 mg via EPIDURAL

## 2023-04-15 NOTE — Telephone Encounter (Signed)
VOB submitted for Monovisc, left knee 

## 2023-04-16 ENCOUNTER — Other Ambulatory Visit: Payer: Medicare HMO

## 2023-04-28 ENCOUNTER — Other Ambulatory Visit: Payer: Self-pay | Admitting: Internal Medicine

## 2023-04-28 DIAGNOSIS — Z0000 Encounter for general adult medical examination without abnormal findings: Secondary | ICD-10-CM | POA: Diagnosis not present

## 2023-04-28 DIAGNOSIS — Z9884 Bariatric surgery status: Secondary | ICD-10-CM | POA: Diagnosis not present

## 2023-04-28 DIAGNOSIS — E1165 Type 2 diabetes mellitus with hyperglycemia: Secondary | ICD-10-CM | POA: Diagnosis not present

## 2023-04-28 DIAGNOSIS — G4733 Obstructive sleep apnea (adult) (pediatric): Secondary | ICD-10-CM | POA: Diagnosis not present

## 2023-04-28 DIAGNOSIS — I712 Thoracic aortic aneurysm, without rupture, unspecified: Secondary | ICD-10-CM | POA: Diagnosis not present

## 2023-04-28 DIAGNOSIS — M5416 Radiculopathy, lumbar region: Secondary | ICD-10-CM

## 2023-04-28 DIAGNOSIS — M5136 Other intervertebral disc degeneration, lumbar region: Secondary | ICD-10-CM | POA: Diagnosis not present

## 2023-04-28 DIAGNOSIS — I10 Essential (primary) hypertension: Secondary | ICD-10-CM | POA: Diagnosis not present

## 2023-04-28 DIAGNOSIS — E1151 Type 2 diabetes mellitus with diabetic peripheral angiopathy without gangrene: Secondary | ICD-10-CM | POA: Diagnosis not present

## 2023-04-28 DIAGNOSIS — M179 Osteoarthritis of knee, unspecified: Secondary | ICD-10-CM | POA: Diagnosis not present

## 2023-05-04 DIAGNOSIS — M25562 Pain in left knee: Secondary | ICD-10-CM | POA: Diagnosis not present

## 2023-05-04 DIAGNOSIS — I5021 Acute systolic (congestive) heart failure: Secondary | ICD-10-CM | POA: Diagnosis not present

## 2023-05-04 DIAGNOSIS — K5900 Constipation, unspecified: Secondary | ICD-10-CM | POA: Diagnosis not present

## 2023-05-04 DIAGNOSIS — E119 Type 2 diabetes mellitus without complications: Secondary | ICD-10-CM | POA: Diagnosis not present

## 2023-05-04 DIAGNOSIS — M545 Low back pain, unspecified: Secondary | ICD-10-CM | POA: Diagnosis not present

## 2023-05-04 DIAGNOSIS — I714 Abdominal aortic aneurysm, without rupture, unspecified: Secondary | ICD-10-CM | POA: Diagnosis not present

## 2023-05-04 DIAGNOSIS — Z79899 Other long term (current) drug therapy: Secondary | ICD-10-CM | POA: Diagnosis not present

## 2023-05-11 ENCOUNTER — Ambulatory Visit
Admission: RE | Admit: 2023-05-11 | Discharge: 2023-05-11 | Disposition: A | Discharge status: Home / Self Care | Payer: Medicare Other | Source: Ambulatory Visit | Attending: Internal Medicine | Admitting: Internal Medicine

## 2023-05-11 ENCOUNTER — Other Ambulatory Visit: Payer: Self-pay | Admitting: Internal Medicine

## 2023-05-11 DIAGNOSIS — M79606 Pain in leg, unspecified: Secondary | ICD-10-CM | POA: Diagnosis not present

## 2023-05-11 DIAGNOSIS — M4727 Other spondylosis with radiculopathy, lumbosacral region: Secondary | ICD-10-CM | POA: Diagnosis not present

## 2023-05-11 DIAGNOSIS — M5416 Radiculopathy, lumbar region: Secondary | ICD-10-CM

## 2023-05-11 DIAGNOSIS — M545 Low back pain, unspecified: Secondary | ICD-10-CM | POA: Diagnosis not present

## 2023-05-11 MED ORDER — METHYLPREDNISOLONE ACETATE 40 MG/ML INJ SUSP (RADIOLOG
80.0000 mg | Freq: Once | INTRAMUSCULAR | Status: AC
  Administered 2023-05-11: 80 mg via EPIDURAL

## 2023-05-11 MED ORDER — IOPAMIDOL (ISOVUE-M 300) INJECTION 61%
1.0000 mL | Freq: Once | INTRAMUSCULAR | Status: AC | PRN
  Administered 2023-05-11: 1 mL via EPIDURAL

## 2023-05-11 NOTE — Discharge Instructions (Signed)

## 2023-05-12 ENCOUNTER — Telehealth: Payer: Self-pay

## 2023-05-12 NOTE — Telephone Encounter (Signed)
VOB resubmitted for Durolane, left knee due to insurance change.

## 2023-05-19 ENCOUNTER — Telehealth: Payer: Self-pay | Admitting: Orthopaedic Surgery

## 2023-05-19 ENCOUNTER — Other Ambulatory Visit: Payer: Self-pay

## 2023-05-19 DIAGNOSIS — G8929 Other chronic pain: Secondary | ICD-10-CM

## 2023-05-19 NOTE — Telephone Encounter (Signed)
Talked with patient and he has been scheduled for gel injection.

## 2023-05-19 NOTE — Telephone Encounter (Signed)
Pt called for status of Gel injection. Please call pt at [phone].

## 2023-05-20 ENCOUNTER — Ambulatory Visit: Payer: Medicare Other | Admitting: Orthopaedic Surgery

## 2023-05-20 ENCOUNTER — Encounter: Payer: Self-pay | Admitting: Orthopaedic Surgery

## 2023-05-20 DIAGNOSIS — M1712 Unilateral primary osteoarthritis, left knee: Secondary | ICD-10-CM | POA: Diagnosis not present

## 2023-05-20 MED ORDER — SODIUM HYALURONATE 60 MG/3ML IX PRSY
60.0000 mg | PREFILLED_SYRINGE | INTRA_ARTICULAR | Status: AC | PRN
Start: 2023-05-20 — End: 2023-05-20
  Administered 2023-05-20: 60 mg via INTRA_ARTICULAR

## 2023-05-20 NOTE — Progress Notes (Signed)
Office Visit Note   Patient: Bryan Wells           Date of Birth: 31-Oct-1961           MRN: [id number] Visit Date: 05/20/2023              Requested by: Georgann Housekeeper, MD [address] [place],  [place] [address] PCP: Georgann Housekeeper, MD   Assessment & Plan: Visit Diagnoses:  1. Unilateral primary osteoarthritis, left knee     Plan: Impression is left knee OA.  Today, proceed with Durolane injection to the left knee.  He tolerated this well.  He will continue to work at weight loss.  Follow-up with [place] as needed. Durolane injection Lot #[id number]  Follow-Up Instructions: Return if symptoms worsen or fail to improve.   Orders:  No orders of the defined types were placed in this encounter.  No orders of the defined types were placed in this encounter.     Procedures: Large Joint Inj: L knee on 05/20/2023 8:09 PM Indications: pain Details: 22 G needle  Arthrogram: No  Medications: 60 mg Sodium Hyaluronate 60 MG/3ML Outcome: tolerated well, no immediate complications Patient was prepped and draped in the usual sterile fashion.       Clinical Data: No additional findings.   Subjective: Chief Complaint  Patient presents with   Left Knee - Follow-up    Durolane    HPI patient is a pleasant 62 year old gentleman who comes in today for left knee Durolane injection.  Previous cortisone injection did not provide much relief.     Objective: Vital Signs: There were no vitals taken for this visit.    Ortho Exam unchanged left knee exam  Specialty Comments:  No specialty comments available.  Imaging: No new imaging   PMFS History: Patient Active Problem List   Diagnosis Date Noted   S/P laparoscopic sleeve gastrectomy 01/28/2022   Anxiety 01/14/2022   Chronic pain 01/14/2022   History of dissection of thoracic aorta 07/23/2021   Ruptured aneurysm of thoracoabdominal aorta (HCC) 07/23/2021   Hypertension associated with diabetes (HCC)  07/23/2021   Aortic atherosclerosis (HCC) 07/23/2021   Coronary artery calcification 07/23/2021   Dissection of thoracic aorta (HCC) 06/13/2021   Dyslipidemia 06/13/2021   Dysphagia 06/13/2021   Gastro-esophageal reflux disease without esophagitis 06/13/2021   Long term (current) use of insulin (HCC) 06/13/2021   Osteoarthritis of knee 06/13/2021   Pure hypercholesterolemia 06/13/2021   Thoracic aortic aneurysm without rupture (HCC) 06/13/2021   Type 2 diabetes mellitus with other specified complication (HCC) 06/13/2021   History of Bell's palsy 02/28/2020   Sudden left hearing loss 02/28/2020   Tinnitus of left ear 02/28/2020   Noncompliance with diabetes treatment 10/02/2018   Bell's palsy 08/21/2017   Hyperlipidemia 08/21/2017   Morbid obesity with BMI of 50.0-59.9, adult (HCC) 08/21/2017   Sleep apnea 08/21/2017   Type 2 diabetes mellitus with hyperglycemia (HCC) 08/21/2017   SINUSITIS, CHRONIC 12/11/2009   ERECTILE DYSFUNCTION, ORGANIC 12/11/2009   MORBID OBESITY 12/10/2009   DEPRESSION 12/10/2009   ULNAR NERVE ENTRAPMENT 12/10/2009   AORTIC ANEURYSM 12/10/2009   HYPERTENSION NEC 12/10/2009   History of AAA (abdominal aortic aneurysm) repair 12/10/2009   Hypertension 12/10/2009   Past Medical History:  Diagnosis Date   AAA (abdominal aortic aneurysm) (HCC)    followed by vascular -- dr Jenne Pane-- infrarenal AAA and proximal descending thoracic    Anxiety    Bell's palsy 05/2017   left side-- per  pt residual mild stiffness   Depression    GERD (gastroesophageal reflux disease)    History of aortic dissection 09/2008   type B  s/p  stenting BMS's distal descending thoracic into bilateral iliac arteries   History of MI (myocardial infarction)    per vascular note in epic,  post op complicattion aortic/ iliac stenting MI with tracheostomy for prolonged vent.   Hyperlipidemia    hypercholesterolemia   Hypertension    Morbid obesity with BMI of 50.0-59.9, adult (HCC)     OSA on CPAP    Type 2 diabetes mellitus treated with insulin (HCC)    followed by pcp   Wears dentures    upper   Wears glasses     Family History  Problem Relation Age of Onset   Diabetes Father    Hyperlipidemia Sister    Hypertension Sister    Obesity Sister    Coronary artery disease Sister    Hyperlipidemia Brother     Past Surgical History:  Procedure Laterality Date   COLONOSCOPY WITH PROPOFOL N/A 02/22/2019   Procedure: COLONOSCOPY WITH PROPOFOL;  Surgeon: Graylin Shiver, MD;  Location: [facility];  Service: Endoscopy;  Laterality: N/A;   DESCENDING AORTIC ANEURYSM REPAIR W/ STENT  10/ 2009  in [place]   BMS x2 distal portion extending into bilateral common iliac arteries   ESOPHAGOGASTRODUODENOSCOPY (EGD) WITH PROPOFOL N/A 02/22/2019   Procedure: ESOPHAGOGASTRODUODENOSCOPY (EGD) WITH PROPOFOL;  Surgeon: Graylin Shiver, MD;  [facility];  Service: Endoscopy;  Laterality: N/A;   UPPER GI ENDOSCOPY N/A 01/28/2022   Procedure: UPPER GI ENDOSCOPY;  Surgeon: Luretha Murphy, MD;  Location: [facility];  Service: General;  Laterality: N/A;   Social History   Occupational History   Not on file  Tobacco Use   Smoking status: Former    Years: 20    Types: Cigarettes    Quit date: 02/21/2012    Years since quitting: 11.2   Smokeless tobacco: Never  Vaping Use   Vaping Use: Never used  Substance and Sexual Activity   Alcohol use: No   Drug use: Never   Sexual activity: Not on file

## 2023-05-22 ENCOUNTER — Other Ambulatory Visit: Payer: Self-pay | Admitting: Internal Medicine

## 2023-05-22 ENCOUNTER — Ambulatory Visit: Payer: Medicare HMO | Admitting: Podiatry

## 2023-05-22 DIAGNOSIS — M5416 Radiculopathy, lumbar region: Secondary | ICD-10-CM

## 2023-05-26 ENCOUNTER — Other Ambulatory Visit: Payer: Self-pay | Admitting: Internal Medicine

## 2023-05-26 ENCOUNTER — Ambulatory Visit
Admission: RE | Admit: 2023-05-26 | Discharge: 2023-05-26 | Disposition: A | Discharge status: Home / Self Care | Payer: Medicare Other | Source: Ambulatory Visit | Attending: Internal Medicine | Admitting: Internal Medicine

## 2023-05-26 DIAGNOSIS — M47816 Spondylosis without myelopathy or radiculopathy, lumbar region: Secondary | ICD-10-CM | POA: Diagnosis not present

## 2023-05-26 DIAGNOSIS — M5416 Radiculopathy, lumbar region: Secondary | ICD-10-CM

## 2023-05-26 MED ORDER — IOPAMIDOL (ISOVUE-M 200) INJECTION 41%
1.0000 mL | Freq: Once | INTRAMUSCULAR | Status: AC
  Administered 2023-05-26: 1 mL via EPIDURAL

## 2023-05-26 MED ORDER — METHYLPREDNISOLONE ACETATE 40 MG/ML INJ SUSP (RADIOLOG
40.0000 mg | Freq: Once | INTRAMUSCULAR | Status: AC
  Administered 2023-05-26: 40 mg via INTRA_ARTICULAR

## 2023-05-26 MED ORDER — METHYLPREDNISOLONE ACETATE 40 MG/ML INJ SUSP (RADIOLOG
80.0000 mg | Freq: Once | INTRAMUSCULAR | Status: AC
  Administered 2023-05-26: 80 mg via INTRA_ARTICULAR

## 2023-05-26 NOTE — Discharge Instructions (Signed)

## 2023-06-02 DIAGNOSIS — Z79899 Other long term (current) drug therapy: Secondary | ICD-10-CM | POA: Diagnosis not present

## 2023-06-02 DIAGNOSIS — I714 Abdominal aortic aneurysm, without rupture, unspecified: Secondary | ICD-10-CM | POA: Diagnosis not present

## 2023-06-02 DIAGNOSIS — I5021 Acute systolic (congestive) heart failure: Secondary | ICD-10-CM | POA: Diagnosis not present

## 2023-06-02 DIAGNOSIS — M25562 Pain in left knee: Secondary | ICD-10-CM | POA: Diagnosis not present

## 2023-06-02 DIAGNOSIS — E119 Type 2 diabetes mellitus without complications: Secondary | ICD-10-CM | POA: Diagnosis not present

## 2023-06-02 DIAGNOSIS — K5900 Constipation, unspecified: Secondary | ICD-10-CM | POA: Diagnosis not present

## 2023-06-02 DIAGNOSIS — M545 Low back pain, unspecified: Secondary | ICD-10-CM | POA: Diagnosis not present

## 2023-06-05 DIAGNOSIS — Z79899 Other long term (current) drug therapy: Secondary | ICD-10-CM | POA: Diagnosis not present

## 2023-06-29 ENCOUNTER — Ambulatory Visit: Payer: Medicare Other | Admitting: Orthopaedic Surgery

## 2023-06-29 DIAGNOSIS — G8929 Other chronic pain: Secondary | ICD-10-CM | POA: Diagnosis not present

## 2023-06-29 DIAGNOSIS — M25562 Pain in left knee: Secondary | ICD-10-CM

## 2023-06-29 NOTE — Progress Notes (Signed)
The patient comes in today requesting another injection in his left knee.  He is actually patient and my partner Dr. Roda Shutters who just put a hyaluronic acid injection in his left knee at the end of May of this year.  He also had a steroid injection in that left knee at the end of April of this year.  He is diabetic and is morbidly obese.  He said the injections have not helped.  Although he is coming in requesting another injection.  I did explain to him that it is too early to consider any other types of injections in his knee.  With him being a diabetic, I would not place a steroid injection in his knee today since it has been under 3 months.  He is understands this fully.  He talked about the possibility of arthroscopic surgery for his knee as opposed to knee replacement surgery.  He said he would like to follow-up in with Dr. Roda Shutters in the near future to discuss the possibility of arthroscopic surgery on that knee.

## 2023-07-02 DIAGNOSIS — R5383 Other fatigue: Secondary | ICD-10-CM | POA: Diagnosis not present

## 2023-07-02 DIAGNOSIS — E119 Type 2 diabetes mellitus without complications: Secondary | ICD-10-CM | POA: Diagnosis not present

## 2023-07-02 DIAGNOSIS — E559 Vitamin D deficiency, unspecified: Secondary | ICD-10-CM | POA: Diagnosis not present

## 2023-07-02 DIAGNOSIS — M545 Low back pain, unspecified: Secondary | ICD-10-CM | POA: Diagnosis not present

## 2023-07-02 DIAGNOSIS — Z79899 Other long term (current) drug therapy: Secondary | ICD-10-CM | POA: Diagnosis not present

## 2023-07-02 DIAGNOSIS — K5900 Constipation, unspecified: Secondary | ICD-10-CM | POA: Diagnosis not present

## 2023-07-02 DIAGNOSIS — M25562 Pain in left knee: Secondary | ICD-10-CM | POA: Diagnosis not present

## 2023-07-02 DIAGNOSIS — I714 Abdominal aortic aneurysm, without rupture, unspecified: Secondary | ICD-10-CM | POA: Diagnosis not present

## 2023-07-02 DIAGNOSIS — M129 Arthropathy, unspecified: Secondary | ICD-10-CM | POA: Diagnosis not present

## 2023-07-02 DIAGNOSIS — I5021 Acute systolic (congestive) heart failure: Secondary | ICD-10-CM | POA: Diagnosis not present

## 2023-07-02 DIAGNOSIS — E7800 Pure hypercholesterolemia, unspecified: Secondary | ICD-10-CM | POA: Diagnosis not present

## 2023-07-06 ENCOUNTER — Ambulatory Visit: Payer: Medicare Other | Admitting: Orthopedic Surgery

## 2023-07-07 DIAGNOSIS — Z79899 Other long term (current) drug therapy: Secondary | ICD-10-CM | POA: Diagnosis not present

## 2023-07-30 DIAGNOSIS — E119 Type 2 diabetes mellitus without complications: Secondary | ICD-10-CM | POA: Diagnosis not present

## 2023-07-30 DIAGNOSIS — I5021 Acute systolic (congestive) heart failure: Secondary | ICD-10-CM | POA: Diagnosis not present

## 2023-07-30 DIAGNOSIS — K5900 Constipation, unspecified: Secondary | ICD-10-CM | POA: Diagnosis not present

## 2023-07-30 DIAGNOSIS — M545 Low back pain, unspecified: Secondary | ICD-10-CM | POA: Diagnosis not present

## 2023-07-30 DIAGNOSIS — M25562 Pain in left knee: Secondary | ICD-10-CM | POA: Diagnosis not present

## 2023-07-30 DIAGNOSIS — I714 Abdominal aortic aneurysm, without rupture, unspecified: Secondary | ICD-10-CM | POA: Diagnosis not present

## 2023-08-06 DIAGNOSIS — M25562 Pain in left knee: Secondary | ICD-10-CM | POA: Diagnosis not present

## 2023-08-06 DIAGNOSIS — M5431 Sciatica, right side: Secondary | ICD-10-CM | POA: Diagnosis not present

## 2023-08-06 DIAGNOSIS — M545 Low back pain, unspecified: Secondary | ICD-10-CM | POA: Diagnosis not present

## 2023-08-07 ENCOUNTER — Ambulatory Visit: Payer: Medicare Other | Admitting: Podiatry

## 2023-08-10 DIAGNOSIS — M545 Low back pain, unspecified: Secondary | ICD-10-CM | POA: Diagnosis not present

## 2023-08-10 DIAGNOSIS — M5431 Sciatica, right side: Secondary | ICD-10-CM | POA: Diagnosis not present

## 2023-08-10 DIAGNOSIS — M25562 Pain in left knee: Secondary | ICD-10-CM | POA: Diagnosis not present

## 2023-08-13 DIAGNOSIS — M25562 Pain in left knee: Secondary | ICD-10-CM | POA: Diagnosis not present

## 2023-08-13 DIAGNOSIS — M5431 Sciatica, right side: Secondary | ICD-10-CM | POA: Diagnosis not present

## 2023-08-13 DIAGNOSIS — M545 Low back pain, unspecified: Secondary | ICD-10-CM | POA: Diagnosis not present

## 2023-08-20 DIAGNOSIS — M25562 Pain in left knee: Secondary | ICD-10-CM | POA: Diagnosis not present

## 2023-08-20 DIAGNOSIS — M5431 Sciatica, right side: Secondary | ICD-10-CM | POA: Diagnosis not present

## 2023-08-20 DIAGNOSIS — M545 Low back pain, unspecified: Secondary | ICD-10-CM | POA: Diagnosis not present

## 2023-08-21 ENCOUNTER — Ambulatory Visit (INDEPENDENT_AMBULATORY_CARE_PROVIDER_SITE_OTHER): Payer: Medicare Other | Admitting: Podiatry

## 2023-08-21 DIAGNOSIS — M79675 Pain in left toe(s): Secondary | ICD-10-CM

## 2023-08-21 DIAGNOSIS — B351 Tinea unguium: Secondary | ICD-10-CM

## 2023-08-21 DIAGNOSIS — M79674 Pain in right toe(s): Secondary | ICD-10-CM | POA: Diagnosis not present

## 2023-08-21 DIAGNOSIS — E1142 Type 2 diabetes mellitus with diabetic polyneuropathy: Secondary | ICD-10-CM

## 2023-08-21 NOTE — Progress Notes (Signed)
  Subjective:  Patient ID: Bryan Wells, male    DOB: September 09, 1961,  MRN: [id number]  Chief Complaint  Patient presents with   Nail Problem    Diabetic Foot Care-nail trim     62 y.o. male presents with the above complaint. History confirmed with patient. Patient presenting with pain related to dystrophic thickened elongated nails. Patient is unable to trim own nails related to nail dystrophy and/or mobility issues. Patient does have a history of T2DM.   Objective:  Physical Exam: warm, good capillary refill nail exam onychomycosis of the toenails, onycholysis, and dystrophic nails DP pulses palpable, PT pulses palpable, and protective sensation absent Left Foot:  Pain with palpation of nails due to elongation and dystrophic growth.  Right Foot: Pain with palpation of nails due to elongation and dystrophic growth.   Assessment:   1. Pain due to onychomycosis of toenails of both feet   2. Diabetic peripheral neuropathy associated with type 2 diabetes mellitus (HCC)      Plan:  Patient was evaluated and treated and all questions answered.  #Onychomycosis with pain  -Nails palliatively debrided as below. -Educated on self-care  Procedure: Nail Debridement Rationale: Pain Type of Debridement: manual, sharp debridement. Instrumentation: Nail nipper, rotary burr. Number of Nails: 10  Return in about 3 months (around 11/21/2023) for [facility].         Corinna Gab, DPM [facility] / [facility]

## 2023-08-27 ENCOUNTER — Encounter (HOSPITAL_COMMUNITY): Payer: Self-pay | Admitting: *Deleted

## 2023-08-28 DIAGNOSIS — M545 Low back pain, unspecified: Secondary | ICD-10-CM | POA: Diagnosis not present

## 2023-08-28 DIAGNOSIS — M25562 Pain in left knee: Secondary | ICD-10-CM | POA: Diagnosis not present

## 2023-08-28 DIAGNOSIS — M5431 Sciatica, right side: Secondary | ICD-10-CM | POA: Diagnosis not present

## 2023-09-03 DIAGNOSIS — E119 Type 2 diabetes mellitus without complications: Secondary | ICD-10-CM | POA: Diagnosis not present

## 2023-09-03 DIAGNOSIS — Z79899 Other long term (current) drug therapy: Secondary | ICD-10-CM | POA: Diagnosis not present

## 2023-09-03 DIAGNOSIS — M545 Low back pain, unspecified: Secondary | ICD-10-CM | POA: Diagnosis not present

## 2023-09-03 DIAGNOSIS — K5900 Constipation, unspecified: Secondary | ICD-10-CM | POA: Diagnosis not present

## 2023-09-03 DIAGNOSIS — M25562 Pain in left knee: Secondary | ICD-10-CM | POA: Diagnosis not present

## 2023-09-04 DIAGNOSIS — M545 Low back pain, unspecified: Secondary | ICD-10-CM | POA: Diagnosis not present

## 2023-09-04 DIAGNOSIS — M5431 Sciatica, right side: Secondary | ICD-10-CM | POA: Diagnosis not present

## 2023-09-04 DIAGNOSIS — M25562 Pain in left knee: Secondary | ICD-10-CM | POA: Diagnosis not present

## 2023-09-07 DIAGNOSIS — Z9884 Bariatric surgery status: Secondary | ICD-10-CM | POA: Diagnosis not present

## 2023-09-07 DIAGNOSIS — E1169 Type 2 diabetes mellitus with other specified complication: Secondary | ICD-10-CM | POA: Diagnosis not present

## 2023-09-07 DIAGNOSIS — Z0000 Encounter for general adult medical examination without abnormal findings: Secondary | ICD-10-CM | POA: Diagnosis not present

## 2023-09-07 DIAGNOSIS — Z79899 Other long term (current) drug therapy: Secondary | ICD-10-CM | POA: Diagnosis not present

## 2023-09-07 DIAGNOSIS — I719 Aortic aneurysm of unspecified site, without rupture: Secondary | ICD-10-CM | POA: Diagnosis not present

## 2023-09-07 DIAGNOSIS — M5432 Sciatica, left side: Secondary | ICD-10-CM | POA: Diagnosis not present

## 2023-09-07 DIAGNOSIS — M25562 Pain in left knee: Secondary | ICD-10-CM | POA: Diagnosis not present

## 2023-09-15 DIAGNOSIS — Z79899 Other long term (current) drug therapy: Secondary | ICD-10-CM | POA: Diagnosis not present

## 2023-09-15 DIAGNOSIS — E1169 Type 2 diabetes mellitus with other specified complication: Secondary | ICD-10-CM | POA: Diagnosis not present

## 2023-09-15 DIAGNOSIS — I110 Hypertensive heart disease with heart failure: Secondary | ICD-10-CM | POA: Diagnosis not present

## 2023-09-15 DIAGNOSIS — Z0001 Encounter for general adult medical examination with abnormal findings: Secondary | ICD-10-CM | POA: Diagnosis not present

## 2023-09-15 DIAGNOSIS — Z23 Encounter for immunization: Secondary | ICD-10-CM | POA: Diagnosis not present

## 2023-09-15 DIAGNOSIS — M5432 Sciatica, left side: Secondary | ICD-10-CM | POA: Diagnosis not present

## 2023-09-15 DIAGNOSIS — M1712 Unilateral primary osteoarthritis, left knee: Secondary | ICD-10-CM | POA: Diagnosis not present

## 2023-09-15 DIAGNOSIS — K5903 Drug induced constipation: Secondary | ICD-10-CM | POA: Diagnosis not present

## 2023-09-21 DIAGNOSIS — M1712 Unilateral primary osteoarthritis, left knee: Secondary | ICD-10-CM | POA: Diagnosis not present

## 2023-09-21 DIAGNOSIS — S83242A Other tear of medial meniscus, current injury, left knee, initial encounter: Secondary | ICD-10-CM | POA: Diagnosis not present

## 2023-09-21 DIAGNOSIS — S76112A Strain of left quadriceps muscle, fascia and tendon, initial encounter: Secondary | ICD-10-CM | POA: Diagnosis not present

## 2023-09-29 ENCOUNTER — Telehealth: Payer: Self-pay | Admitting: Orthopaedic Surgery

## 2023-09-29 NOTE — Telephone Encounter (Signed)
Agreed.  The patient has too large.

## 2023-09-29 NOTE — Telephone Encounter (Signed)
Tried to call patient. No answer. Voicemail is full.  Dr.Xu has written script for patient to be fitted at his medical supply place of choice.  Script will be up front for pick up.

## 2023-09-29 NOTE — Telephone Encounter (Signed)
Patient called wanting a fitting for a knee brace. CB#[phone]

## 2023-11-17 ENCOUNTER — Ambulatory Visit (INDEPENDENT_AMBULATORY_CARE_PROVIDER_SITE_OTHER): Payer: Self-pay | Admitting: Audiology

## 2023-11-17 DIAGNOSIS — H903 Sensorineural hearing loss, bilateral: Secondary | ICD-10-CM

## 2023-11-18 NOTE — Progress Notes (Signed)
  [address] [place], [place] [address] [phone]  Hearing Aid Check     Bryan Wells comes as a walk in to pick up his repaired hearing aid and return the left hearing aid loaner and charger.      Patient's hearing aid was stored in office by Dr. Diona Fanti. The patient called yesterday and said to the front desk staff Ms. Merlinda Frederick he was going to come today to pick up his hearing aid. The hearing aid was given to the patient by Ms.McLaughlin.  Actions taken: Patient said to [name] that it was working well prior to leaving the office.   Services fee: $0 was paid at checkout.    Recommend: Return for a hearing aid check , as needed. Return for a hearing evaluation and to see an ENT, if concerns with hearing changes arise.    Bryan Wells Bryan Wells, AUD

## 2023-11-20 ENCOUNTER — Ambulatory Visit: Payer: Medicare Other | Admitting: Podiatry

## 2023-11-24 ENCOUNTER — Ambulatory Visit: Payer: Medicare Other | Admitting: Podiatry

## 2023-12-09 LAB — LAB REPORT - SCANNED: A1c: 8.6

## 2024-01-07 ENCOUNTER — Encounter: Payer: Self-pay | Admitting: Family Medicine

## 2024-01-07 ENCOUNTER — Other Ambulatory Visit: Payer: Self-pay | Admitting: Family Medicine

## 2024-01-07 DIAGNOSIS — M5431 Sciatica, right side: Secondary | ICD-10-CM

## 2024-01-07 DIAGNOSIS — M5432 Sciatica, left side: Secondary | ICD-10-CM

## 2024-01-08 ENCOUNTER — Encounter: Payer: Self-pay | Admitting: Family Medicine

## 2024-01-09 NOTE — Patient Instructions (Addendum)
SURGICAL WAITING ROOM VISITATION Patients having surgery or a procedure may have no more than 2 support people in the waiting area - these visitors may rotate in the visitor waiting room.   Due to an increase in RSV and influenza rates and associated hospitalizations, children ages 45 and under may not visit patients in [facility] hospitals. If the patient needs to stay at the hospital during part of their recovery, the visitor guidelines for inpatient rooms apply.  PRE-OP VISITATION  Pre-op nurse will coordinate an appropriate time for 1 support person to accompany the patient in pre-op.  This support person may not rotate.  This visitor will be contacted when the time is appropriate for the visitor to come back in the pre-op area.  Please refer to the [facility] website for the visitor guidelines for Inpatients (after your surgery is over and you are in a regular room).  You are not required to quarantine at this time prior to your surgery. However, you must do this: Hand Hygiene often Do NOT share personal items Notify your provider if you are in close contact with someone who has COVID or you develop fever 100.4 or greater, new onset of sneezing, cough, sore throat, shortness of breath or body aches.  If you test positive for Covid or have been in contact with anyone that has tested positive in the last 10 days please notify you surgeon.    Your procedure is scheduled on:  MONDAY   January 25, 2024  Report to [facility] Main Entrance: [facility]sen entrance where the [employer] is available.   Report to admitting at: 08:15  AM  Call this number if you have any questions or problems the morning of surgery [phone]  Do not eat or drink anything after Midnight the night prior to your surgery/procedure.     FOLLOW ANY ADDITIONAL PRE OP INSTRUCTIONS YOU RECEIVED FROM YOUR SURGEON'S OFFICE!!!   Oral Hygiene is also important to reduce your risk of infection.         Remember - BRUSH YOUR TEETH THE MORNING OF SURGERY WITH YOUR REGULAR TOOTHPASTE  Do NOT smoke after Midnight the night before surgery.    Diabetic medications/ instructions:   Metformin : Day BEFORE surgery take as usual. DAY OF SURGERY: DO NOT TAKE METFORMIN,  Insulin degludec Evaristo Bury) 60 units daily,  day before: Take 100% or 60 units (morning or lunch)  On the day of surgery:  take only 50% or 30 units.  Glimepiride (Amaryl)- Day BEFORE surgery take as usual. DAY OF SURGERY: DO NOT TAKE Glimepiride    STOP TAKING all Vitamins, Herbs and supplements 1 week before your surgery.   Stop taking Aspirin 1 week before surgery.   Take ONLY these medicines the morning of surgery with A SIP OF WATER: Pantoprazole, labetalol, duloxetine.  You may take Gabapentin and EITHER Tylenol OR Oxycodone if needed depending on pain level.    You may not have any metal on your body including  jewelry, and body piercing  Do not wear  lotions, powders, cologne, or deodorant  Men may shave face and neck.  Contacts, Hearing Aids, dentures or bridgework may not be worn into surgery. DENTURES WILL BE REMOVED PRIOR TO SURGERY PLEASE DO NOT APPLY "Poly grip" OR ADHESIVES!!!  Patients discharged on the day of surgery will not be allowed to drive home.  Someone NEEDS to stay with you for the first 24 hours after anesthesia.  Do not bring your home  medications to the hospital. The Pharmacy will dispense medications listed on your medication list to you during your admission in the Hospital.  Please read over the following fact sheets you were given: IF YOU HAVE QUESTIONS ABOUT YOUR PRE-OP INSTRUCTIONS, PLEASE CALL [phone]   [facility] - Preparing for Surgery Before surgery, you can play an important role.  Because skin is not sterile, your skin needs to be as free of germs as possible.  You can reduce the number of germs on your skin by washing with CHG (chlorahexidine gluconate) soap before surgery.   CHG is an antiseptic cleaner which kills germs and bonds with the skin to continue killing germs even after washing. Please DO NOT use if you have an allergy to CHG or antibacterial soaps.  If your skin becomes reddened/irritated stop using the CHG and inform your nurse when you arrive at Short Stay. Do not shave (including legs and underarms) for at least 48 hours prior to the first CHG shower.  You may shave your face/neck.  Please follow these instructions carefully:  1.  Shower with CHG Soap the night before surgery and the  morning of surgery.  2.  If you choose to wash your hair, wash your hair first as usual with your normal  shampoo.  3.  After you shampoo, rinse your hair and body thoroughly to remove the shampoo.                             4.  Use CHG as you would any other liquid soap.  You can apply chg directly to the skin and wash.  Gently with a scrungie or clean washcloth.  5.  Apply the CHG Soap to your body ONLY FROM THE NECK DOWN.   Do not use on face/ open                           Wound or open sores. Avoid contact with eyes, ears mouth and genitals (private parts).                       Wash face,  Genitals (private parts) with your normal soap.             6.  Wash thoroughly, paying special attention to the area where your  surgery  will be performed.  7.  Thoroughly rinse your body with warm water from the neck down.  8.  DO NOT shower/wash with your normal soap after using and rinsing off the CHG Soap.            9.  Pat yourself dry with a clean towel.            10.  Wear clean pajamas.            11.  Place clean sheets on your bed the night of your first shower and do not  sleep with pets.  ON THE DAY OF SURGERY : Do not apply any lotions/deodorants the morning of surgery.  Please wear clean clothes to the hospital/surgery center.     FAILURE TO FOLLOW THESE INSTRUCTIONS MAY RESULT IN THE CANCELLATION OF YOUR SURGERY  PATIENT  SIGNATURE_________________________________  NURSE SIGNATURE__________________________________  ________________________________________________________________________

## 2024-01-09 NOTE — Progress Notes (Signed)
COVID Vaccine received:  []  No [x]  Yes Date of any COVID positive Test in last 90 days:  none  PCP - Brayton El, MD   Cardiologist - Riley Lam, MD (LOV 07-23-2021)   Vascular- Durene Cal, MD (LOV 11-15-2018)  NS for followup CT and appts Pain Mgmt: Christella Hartigan, DO at  [facility]   Chest x-ray - 07-18-2021  2v  Epic EKG -  07-23-2021  will repeat Stress Test - 2011  Epic ECHO -  Cardiac Cath -   PCR screen: []  Ordered & Completed []   No Order but Needs PROFEND     [x]   N/A for this surgery  Surgery Plan:  [x]  Ambulatory   []  Outpatient in bed  []  Admit Anesthesia:    []  General  []  Spinal  [x]   Choice []   MAC  Pacemaker / ICD device [x]  No []  Yes   Spinal Cord Stimulator:[x]  No []  Yes       History of Sleep Apnea? []  No [x]  Yes   CPAP used?- [x]  No []  Yes  CPAP broke  Does the patient monitor blood sugar?   []  N/A   []  No [x]  Yes  Patient has: []  NO Hx DM   []  Pre-DM   []  DM1  [x]   DM2 Last A1c was:  9.1  on  01-20-2022    CBG was 247 at PST appt.   Does patient have a [employer] or Dexacom? [x]  No []  Yes    had Freestyle sensor but Insurance ceased paying for it so he stopped.   Fasting Blood Sugar Ranges- 150-170 Checks Blood Sugar  1 times a day   Diabetic medications/ instructions:  Metformin 1000mg  bid      Hold DOS Insulin degludec Evaristo Bury) 60 units daily,  day before: 100% if am, DOS: 50% Glimepiride (Amaryl)- 2mg  q am,  Hold DOS  Blood Thinner / Instructions:  none Aspirin Instructions:  ASA 81mg   not taking right now.   ERAS Protocol Ordered: []  No  []  Yes PRE-SURGERY []  ENSURE  []  G2   []  No Drink Ordered  Patient is to be NPO after: Midnight prior d/t   NO ORDERS  Dental hx: []  Dentures:  [x]  N/A      []  Bridge or Partial:                   []  Loose or Damaged teeth:   Comments: Sent an IB msg to Dr. Sherlean Foot and Laurier Nancy, PA-C regarding cardiac clearance on this patient. He had a MI (had trache on a vent x 30 days per  patient)  and also has history of  AAA / iliac dissection - stenting in 2009 by Dr at [facility] in [place]. He saw Dr. Durene Cal for current dissection which needs open repair, but pt has had no follow up in several years, I need a clearance on this also.   Activity level: Patient is unable to climb a flight of stairs without difficulty; [x]  No CP  but would have SOB and leg pain.  Patient can perform ADLs without assistance.   Anesthesia review: DM2, HTN, Bell's palsy- left side of face, OSA- no CPAP, GERD, HOH-Left ear- has a Hearing aid, anxiety, CPS- long term opiates, s/p gastric sleeve 01-2022, s/p descending AAA stenting, has dissection (09-2008- [facility]) 2009 MI w/ trache on vent.   Patient denies shortness of breath, fever, cough and chest pain at PAT appointment.  Patient verbalized understanding and agreement to the Pre-Surgical Instructions that were given to them at this PAT appointment. Patient was also educated of the need to review these PAT instructions again prior to his surgery.I reviewed the appropriate phone numbers to call if they have any and questions or concerns.

## 2024-01-11 ENCOUNTER — Encounter (HOSPITAL_COMMUNITY)
Admission: RE | Admit: 2024-01-11 | Discharge: 2024-01-11 | Disposition: A | Discharge status: Home / Self Care | Payer: Medicare Other | Source: Ambulatory Visit | Attending: Orthopedic Surgery | Admitting: Orthopedic Surgery

## 2024-01-11 ENCOUNTER — Encounter (HOSPITAL_COMMUNITY): Payer: Self-pay

## 2024-01-11 ENCOUNTER — Other Ambulatory Visit: Payer: Self-pay

## 2024-01-11 VITALS — BP 121/72 | Temp 98.1°F | Resp 22 | Ht 72.0 in | Wt 361.0 lb

## 2024-01-11 DIAGNOSIS — Z79891 Long term (current) use of opiate analgesic: Secondary | ICD-10-CM | POA: Diagnosis not present

## 2024-01-11 DIAGNOSIS — I152 Hypertension secondary to endocrine disorders: Secondary | ICD-10-CM | POA: Insufficient documentation

## 2024-01-11 DIAGNOSIS — E1165 Type 2 diabetes mellitus with hyperglycemia: Secondary | ICD-10-CM | POA: Diagnosis not present

## 2024-01-11 DIAGNOSIS — Z794 Long term (current) use of insulin: Secondary | ICD-10-CM | POA: Diagnosis not present

## 2024-01-11 DIAGNOSIS — Z01818 Encounter for other preprocedural examination: Secondary | ICD-10-CM | POA: Diagnosis present

## 2024-01-11 DIAGNOSIS — E1169 Type 2 diabetes mellitus with other specified complication: Secondary | ICD-10-CM

## 2024-01-11 DIAGNOSIS — E1159 Type 2 diabetes mellitus with other circulatory complications: Secondary | ICD-10-CM | POA: Insufficient documentation

## 2024-01-11 HISTORY — DX: Chronic pain syndrome: G89.4

## 2024-01-11 HISTORY — DX: Peripheral vascular disease, unspecified: I73.9

## 2024-01-11 HISTORY — DX: Unspecified osteoarthritis, unspecified site: M19.90

## 2024-01-11 HISTORY — DX: Atherosclerotic heart disease of native coronary artery without angina pectoris: I25.10

## 2024-01-11 HISTORY — DX: Acute myocardial infarction, unspecified: I21.9

## 2024-01-11 LAB — COMPREHENSIVE METABOLIC PANEL
ALT: 32 U/L (ref 0–44)
AST: 33 U/L (ref 15–41)
Albumin: 3.5 g/dL (ref 3.5–5.0)
Alkaline Phosphatase: 77 U/L (ref 38–126)
Anion gap: 11 (ref 5–15)
BUN: 17 mg/dL (ref 8–23)
CO2: 21 mmol/L — ABNORMAL LOW (ref 22–32)
Calcium: 9.4 mg/dL (ref 8.9–10.3)
Chloride: 108 mmol/L (ref 98–111)
Creatinine, Ser: 0.98 mg/dL (ref 0.61–1.24)
GFR, Estimated: >60 mL/min (ref >60)
Glucose, Bld: 275 mg/dL — ABNORMAL HIGH (ref 70–99)
Potassium: 4.2 mmol/L (ref 3.5–5.1)
Sodium: 140 mmol/L (ref 135–145)
Total Bilirubin: 0.4 mg/dL (ref 0.0–1.2)
Total Protein: 7.7 g/dL (ref 6.5–8.1)

## 2024-01-11 LAB — CBC
HCT: 42.8 % (ref 39.0–52.0)
Hemoglobin: 13.8 g/dL (ref 13.0–17.0)
MCH: 29.2 pg (ref 26.0–34.0)
MCHC: 32.2 g/dL (ref 30.0–36.0)
MCV: 90.7 fL (ref 80.0–100.0)
Platelets: 227 10*3/uL (ref 150–400)
RBC: 4.72 MIL/uL (ref 4.22–5.81)
RDW: 13.3 % (ref 11.5–15.5)
WBC: 7 10*3/uL (ref 4.0–10.5)
nRBC: 0 % (ref 0.0–0.2)

## 2024-01-11 LAB — HEMOGLOBIN A1C
Hgb A1c MFr Bld: 8.6 % — ABNORMAL HIGH (ref 4.8–5.6)
Mean Plasma Glucose: 200.12 mg/dL

## 2024-01-11 LAB — GLUCOSE, CAPILLARY: Glucose-Capillary: 247 mg/dL — ABNORMAL HIGH (ref 70–99)

## 2024-01-18 ENCOUNTER — Other Ambulatory Visit: Payer: Medicare Other

## 2024-01-18 ENCOUNTER — Other Ambulatory Visit: Payer: Self-pay | Admitting: Orthopedic Surgery

## 2024-01-21 ENCOUNTER — Telehealth: Payer: Self-pay | Admitting: Internal Medicine

## 2024-01-21 NOTE — Telephone Encounter (Signed)
   Pre-operative Risk Assessment    Patient Name: Bryan Wells  DOB: 23-Feb-1961 MRN: [id number]   Date of last office visit: 07/23/2021 Date of next office visit: 01/22/24 Alben Spittle, [place] 9:15am    Request for Surgical Clearance    Procedure:   Knee Arthroscopy with Medical Menisectomy   Date of Surgery:  Clearance 01/25/24                                Surgeon:  Dr. Blanchie Dessert Group or Practice Name:  [facility]  Phone number:  [phone]  Fax number:  [phone]    Type of Clearance Requested:   - Medical    Type of Anesthesia:   Choice    Additional requests/questions:    Alben Spittle   01/21/2024, 2:29 PM

## 2024-01-21 NOTE — Progress Notes (Signed)
Cardiology Office Note:    Date:  01/22/2024  ID:  Bryan Wells, DOB 09/04/1961, MRN [id number] PCP: Ellyn Hack, MD  [facility] Providers Cardiologist:  Christell Constant, MD       Patient Profile:      Type B Aortic Dissection Descending thoracic and infrarenal abdominal aortic aneurysm, R CIA aneurysm S/p repair with stenting 09/2008 Claris Gower)  ? Post op MI CT 07/29/21: stable type B aortic dissection extending from L subclavian to aortic bifurcation, patent bilat CIA stents, descending thoracic aorta 4.8 cm, infrarenal AAA 4.7 cm, R CIA aneurysm 3.1 cm  Coronary artery Ca2+ Myoview 04/30/10: EF 58, no ischemia Hypertension  Hyperlipidemia Diabetes mellitus  OSA   Aortic atherosclerosis         HPI:   Bryan Wells is a 63 y.o. male who returns for follow up of aortic dissection s/p repair, aortic aneurysm, surgical clearance. He was last seen in 07/2021 by Dr. Izora Ribas. He needs to undergo a knee arthroscopy  and medial meniscectomy with Dr. Sherlean Foot (type of anesthesia unknown) 01/25/24.   Discussed the use of AI scribe software for clinical note transcription with the patient, who gave verbal consent to proceed.  He is here alone.  He has not had symptoms of chest discomfort or significant shortness of breath, syncope, orthopnea or significant leg edema.  He does not smoke.  ROS-See HPI    Studies Reviewed:   EKG Interpretation Date/Time:  Friday January 22 2024 09:44:59 EST Ventricular Rate:  105 PR Interval:  120 QRS Duration:  80 QT Interval:  302 QTC Calculation: 399 R Axis:   4  Text Interpretation: Sinus tachycardia Nonspecific T wave abnormality No significant change since last tracing Confirmed by Tereso Newcomer [phone]) on 01/22/2024 9:52:33 AM          Risk Assessment/Calculations:             Physical Exam:   VS:  BP 118/66   Pulse (!) 105   Ht 6' (1.829 m)   Wt (!) 361 lb (163.7 kg)   SpO2 97%   BMI 48.96 kg/m    Wt Readings  from Last 3 Encounters:  01/22/24 (!) 361 lb (163.7 kg)  01/11/24 (!) 361 lb (163.7 kg)  04/14/23 (!) 387 lb (175.5 kg)    Constitutional:      Appearance: Healthy appearance. Not in distress.  Neck:     Vascular: JVD normal.  Pulmonary:     Breath sounds: Normal breath sounds. No wheezing. No rales.  Cardiovascular:     Normal rate. Regular rhythm.     Murmurs: There is no murmur.  Edema:    Peripheral edema present.    Pretibial: bilateral trace edema of the pretibial area. Abdominal:     Palpations: Abdomen is soft.      Assessment and Plan:   Assessment & Plan Preoperative cardiovascular examination Mr. Dargis perioperative risk of a major cardiac event is 0.9% according to the Revised Cardiac Risk Index (RCRI).  Therefore, he is at low risk for perioperative complications.   His functional capacity is fair at 4.06 METs according to the Duke Activity Status Index (DASI). Recommendations: According to ACC/AHA guidelines, no further cardiovascular testing needed.  The patient may proceed to surgery at acceptable risk.   Antiplatelet and/or Anticoagulation Recommendations: Aspirin can be held for 7 days prior to his surgery.  Please resume Aspirin post operatively when it is felt to be safe from a bleeding standpoint.  Aneurysm of descending thoracic aorta without rupture (HCC) History of type B aortic dissection, descending thoracic and infrarenal abdominal aortic aneurysm and right common iliac artery aneurysm.  He underwent repair in 2009 in [place].  CT scan in August 2022 was stable.  I reviewed his case today with Dr. Raynelle Jan.  We feel that he can be cleared for surgery without getting his follow-up scan first.  I will go ahead and order his follow-up scan for surveillance.  Follow-up in 6 months. Essential hypertension Blood pressure controlled.  Continue Cardizem to 40 mg daily, furosemide 40 mg daily, labetalol 300 mg daily. Pure hypercholesterolemia He does have  a history of coronary calcification on prior CT.  LDL goal should be at least less than 100.  He is not currently on statin therapy.  I will request his most recent labs from primary care.       Dispo:  Return in about 6 months (around 07/21/2024) for Routine Follow Up, w/ Dr. Izora Ribas.  Signed, Tereso Newcomer, PA-C

## 2024-01-21 NOTE — Progress Notes (Signed)
Patient phoned today to see if has been cleared for surgery after receiving a call from Dr. Tobin Chad office.  Spoke with Tresa Endo in our anesthesia department who stated that patient needs cardiac clearance and she has advised Dr. Tobin Chad office x2.  Patient was made aware and he will call Dr. Tobin Chad office to follow up.

## 2024-01-21 NOTE — Telephone Encounter (Signed)
    Primary Cardiologist:Mahesh A Chandrasekhar, MD  Chart reviewed as part of pre-operative protocol coverage. Because of Bryan Wells past medical history and time since last visit, he/she will require a follow-up visit in order to better assess preoperative cardiovascular risk.  Pre-op covering staff: - Please schedule Office appointment and call patient to inform them. - Please contact requesting surgeon's office via preferred method (i.e, phone, fax) to inform them of need for appointment prior to surgery.  If applicable, this message will also be routed to pharmacy pool and/or primary cardiologist for input on holding anticoagulant/antiplatelet agent as requested below so that this information is available at time of patient's appointment.   Bryan Asters, NP  01/21/2024, 3:29 PM

## 2024-01-21 NOTE — Telephone Encounter (Signed)
Patient is scheduled to see Bryan Wells tomorrow 01/22/24 for pre op clearance. Will route to requesting surgeons office to make them aware.

## 2024-01-21 NOTE — Progress Notes (Addendum)
DISCUSSION: Bryan Wells is a 63 yo male who presents to PAT prior to left KNEE ARTHROSCOPY WITH MEDIAL MENISECTOMY on 01/25/24 with Dr. Sherlean Wells. PMH of former smoking, HTN, hx of MI, chronic thoracic aortic dissection s/p repair (in 2009), AAA, OSA (uses CPAP), GERD, s/p gastric sleeve, IDDM, obesity, anxiety, depression, chronic pain with narcotic dependence.  Patient last saw CT surgery for his chronic aortic dissection in 2016. Per Dr. Tyrone Wells: "He was originally treated for Type III aortic dissection with ischemic lower extremities by stenting the abdominal aorta by Dr Bryan Wells clinic oct 2009. The post op course was complicated requiring tracheostomy and prolonged ventilation." He was advised to f/u in a year and referred to Vascular but was lost to follow up.  He last saw Vascular surgery in 2019. Per Dr. Myra Wells: "I would recommend considering open repair once his aneurysm reaches 5.5 cm.  He will follow-up with me in 6 months with a repeat CT scan". AAA was 5.1cm at that time. Has been lost to follow up.  Patient saw Cardiology for pre op clearance prior to gastric sleeve surgery in 2022 and CTA was repeated. AAA was measuring 4.8cm and he was cleared for surgery.  Cardiac clearance requested   Addendum 01/22/2024:  Pt seen by cardiology 01/22/2024. Per OV note, "Mr. Bryan Wells's perioperative risk of a major cardiac event is 0.9% according to the Revised Cardiac Risk Index (RCRI).  Therefore, he is at low risk for perioperative complications.   His functional capacity is fair at 4.06 METs according to the Duke Activity Status Index (DASI). Recommendations: According to ACC/AHA guidelines, no further cardiovascular testing needed.  The patient may proceed to surgery at acceptable risk.   Antiplatelet and/or Anticoagulation Recommendations: Aspirin can be held for 7 days prior to his surgery.  Please resume Aspirin post operatively when it is felt to be safe from a bleeding standpoint.  Aneurysm of  descending thoracic aorta without rupture (HCC) History of type B aortic dissection, descending thoracic and infrarenal abdominal aortic aneurysm and right common iliac artery aneurysm.  He underwent repair in 2009 in [place].  CT scan in August 2022 was stable.  I reviewed his case today with Dr. Raynelle Wells.  We feel that he can be cleared for surgery without getting his follow-up scan first.  I will go ahead and order his follow-up scan for surveillance.  Follow-up in 6 months." VS: BP 121/72 Comment: right arm sitting  Temp 36.7 C (Oral)   Resp (!) 22   Ht 6' (1.829 m)   Wt (!) 163.7 kg   SpO2 95%   BMI 48.96 kg/m   PROVIDERS: Bryan Hack, MD   LABS: Labs reviewed: Acceptable for surgery. (all labs ordered are listed, but only abnormal results are displayed)  Labs Reviewed  HEMOGLOBIN A1C - Abnormal; Notable for the following components:      Result Value   Hgb A1c MFr Bld 8.6 (*)    All other components within normal limits  COMPREHENSIVE METABOLIC PANEL - Abnormal; Notable for the following components:   CO2 21 (*)    Glucose, Bld 275 (*)    All other components within normal limits  GLUCOSE, CAPILLARY - Abnormal; Notable for the following components:   Glucose-Capillary 247 (*)    All other components within normal limits  CBC     IMAGES: CT Chest/Aorta 07/29/2021: IMPRESSION: Overall, there is a stable appearance of the type B aortic dissection extending from the origin of the left subclavian artery  to the aortic bifurcation where there are patent bilateral common iliac artery stents. Stable sizes of the aneurysmal components of the descending thoracic aorta and infrarenal abdominal aorta measuring up to 4.8 cm and 4.7 cm, respectively. Stable size of the right common iliac artery aneurysm measuring up to 3.1 cm. No findings of malperfusion or other complicating features.  EKG 01/11/24  Normal sinus rhythm, rate 96 Nonspecific T wave  abnormality  CV:  Past Medical History:  Diagnosis Date   AAA (abdominal aortic aneurysm) (HCC)    followed by vascular -- dr Bryan Wells-- infrarenal AAA and proximal descending thoracic    Anxiety    Arthritis    Bell's palsy 05/2017   left side-- per pt residual mild stiffness   Chronic pain disorder    sees Pain mgmt   Coronary artery disease    Depression    GERD (gastroesophageal reflux disease)    History of aortic dissection 09/2008   type B  s/p  stenting BMS's distal descending thoracic into bilateral iliac arteries   History of MI (myocardial infarction)    per vascular note in epic,  post op complicattion aortic/ iliac stenting MI with tracheostomy for prolonged vent.   Hyperlipidemia    hypercholesterolemia   Hypertension    Morbid obesity with BMI of 50.0-59.9, adult (HCC)    Myocardial infarction (HCC)    OSA on CPAP    CPAP is currently broken and he doesn't have another one yet   Peripheral vascular disease (HCC)    Type 2 diabetes mellitus treated with insulin (HCC)    followed by pcp   Wears dentures    upper   Wears glasses     Past Surgical History:  Procedure Laterality Date   COLONOSCOPY WITH PROPOFOL N/A 02/22/2019   Procedure: COLONOSCOPY WITH PROPOFOL;  Surgeon: Bryan Shiver, MD;  Location: [facility];  Service: Endoscopy;  Laterality: N/A;   DESCENDING AORTIC ANEURYSM REPAIR W/ STENT  10/ 2009  in [place]   BMS x2 distal portion extending into bilateral common iliac arteries   ESOPHAGOGASTRODUODENOSCOPY (EGD) WITH PROPOFOL N/A 02/22/2019   Procedure: ESOPHAGOGASTRODUODENOSCOPY (EGD) WITH PROPOFOL;  Surgeon: Bryan Shiver, MD;  Location: [facility];  Service: Endoscopy;  Laterality: N/A;   LAPAROSCOPIC GASTRIC SLEEVE RESECTION  2023   UPPER GI ENDOSCOPY N/A 01/28/2022   Procedure: UPPER GI ENDOSCOPY;  Surgeon: Bryan Murphy, MD;  Location: [facility];  Service: General;  Laterality: N/A;    MEDICATIONS:  Accu-Chek Softclix Lancets lancets    acetaminophen (TYLENOL) 500 MG tablet   aspirin EC 81 MG tablet   bismuth subsalicylate (KAOPECTATE) 262 MG/15ML suspension   clotrimazole-betamethasone (LOTRISONE) cream   Coenzyme Q10 (CO Q 10) 100 MG CAPS   Continuous Blood Gluc Receiver (FREESTYLE LIBRE 2 READER) DEVI   diclofenac Sodium (VOLTAREN) 1 % GEL   diltiazem (CARDIZEM CD) 240 MG 24 hr capsule   DULoxetine (CYMBALTA) 30 MG capsule   enoxaparin (LOVENOX) 60 MG/0.6ML injection   furosemide (LASIX) 40 MG tablet   gabapentin (NEURONTIN) 600 MG tablet   glimepiride (AMARYL) 2 MG tablet   glucose blood test strip   hydrOXYzine (ATARAX) 25 MG tablet   insulin degludec (TRESIBA FLEXTOUCH) 200 UNIT/ML FlexTouch Pen   Insulin Pen Needle (BD PEN NEEDLE MICRO U/F) 32G X 6 MM MISC   ketoconazole (NIZORAL) 2 % cream   ketorolac (TORADOL) 30 MG/ML injection   labetalol (NORMODYNE) 300 MG tablet   metFORMIN (GLUCOPHAGE) 1000 MG tablet  Needle, Disp, 32G X 5/16" MISC   NONFORMULARY OR COMPOUNDED ITEM   ondansetron (ZOFRAN-ODT) 4 MG disintegrating tablet   oxyCODONE (OXY IR/ROXICODONE) 5 MG immediate release tablet   oxyCODONE-acetaminophen (PERCOCET) 10-325 MG tablet   pantoprazole (PROTONIX) 40 MG tablet   tiZANidine (ZANAFLEX) 4 MG tablet   triamcinolone acetonide (KENALOG-40) 40 MG/ML injection   No current facility-administered medications for this encounter.   Marcille Blanco [facility] Surgical Short Stay/Anesthesiology [facility] Phone [phone] 01/21/2024 9:44 PM

## 2024-01-22 ENCOUNTER — Encounter: Payer: Self-pay | Admitting: Physician Assistant

## 2024-01-22 ENCOUNTER — Ambulatory Visit: Payer: Medicare Other | Attending: Physician Assistant | Admitting: Physician Assistant

## 2024-01-22 VITALS — BP 118/66 | HR 105 | Ht 72.0 in | Wt 361.0 lb

## 2024-01-22 DIAGNOSIS — I7123 Aneurysm of the descending thoracic aorta, without rupture: Secondary | ICD-10-CM

## 2024-01-22 DIAGNOSIS — I7101 Dissection of ascending aorta: Secondary | ICD-10-CM

## 2024-01-22 DIAGNOSIS — I10 Essential (primary) hypertension: Secondary | ICD-10-CM

## 2024-01-22 DIAGNOSIS — E7800 Pure hypercholesterolemia, unspecified: Secondary | ICD-10-CM

## 2024-01-22 DIAGNOSIS — Z01810 Encounter for preprocedural cardiovascular examination: Secondary | ICD-10-CM

## 2024-01-22 NOTE — Patient Instructions (Signed)
Medication Instructions:  Your physician recommends that you continue on your current medications as directed. Please refer to the Current Medication list given to you today.  *If you need a refill on your cardiac medications before your next appointment, please call your pharmacy*   Lab Work: None ordered  If you have labs (blood work) drawn today and your tests are completely normal, you will receive your results only by: MyChart Message (if you have MyChart) OR A paper copy in the mail If you have any lab test that is abnormal or we need to change your treatment, we will call you to review the results.   Testing/Procedures: Non-Cardiac CT Angiography (CTA), is a special type of CT scan that uses a computer to produce multi-dimensional views of major blood vessels throughout the body. In CT angiography, a contrast material is injected through an IV to help visualize the blood vessels    Follow-Up: At [facility], you and your health needs are our priority.  As part of our continuing mission to provide you with exceptional heart care, we have created designated Provider Care Teams.  These Care Teams include your primary Cardiologist (physician) and Advanced Practice Providers (APPs -  Physician Assistants and Nurse Practitioners) who all work together to provide you with the care you need, when you need it.  We recommend signing up for the patient portal called "MyChart".  Sign up information is provided on this After Visit Summary.  MyChart is used to connect with patients for Virtual Visits (Telemedicine).  Patients are able to view lab/test results, encounter notes, upcoming appointments, etc.  Non-urgent messages can be sent to your provider as well.   To learn more about what you can do with MyChart, go to [email or web address].    Your next appointment:   6 month(s)  Provider:   Christell Constant, MD     Other Instructions   1st Floor: - Lobby -  Registration  - Pharmacy  - Lab - Cafe  2nd Floor: - PV Lab - Diagnostic Testing (echo, CT, nuclear med)  3rd Floor: - Vacant  4th Floor: - TCTS (cardiothoracic surgery) - AFib Clinic - Structural Heart Clinic - Vascular Surgery  - Vascular Ultrasound  5th Floor: - [facility] Cardiology (general and EP) - Clinical Pharmacy for coumadin, hypertension, lipid, weight-loss medications, and med management appointments    Valet parking services will be available as well.

## 2024-01-22 NOTE — Anesthesia Preprocedure Evaluation (Signed)
Anesthesia Evaluation  Patient identified by MRN, date of birth, ID band Patient awake    Reviewed: Allergy & Precautions, NPO status , Patient's Chart, lab work & pertinent test results  History of Anesthesia Complications Negative for: history of anesthetic complications  Airway Mallampati: I  TM Distance: >3 FB Neck ROM: Full    Dental  (+) Poor Dentition, Missing, Dental Advisory Given, Chipped   Pulmonary sleep apnea (CPAP broken) , former smoker H/o trach at time of aortic dissection 2009   breath sounds clear to auscultation       Cardiovascular hypertension, Pt. on medications (-) angina + Peripheral Vascular Disease (infrarenal AAA and proximal descending thoracic, stented aortic dissection 2009)   Rhythm:Regular Rate:Normal  History of type B aortic dissection, descending thoracic and infrarenal abdominal aortic aneurysm and right common iliac artery aneurysm.  He underwent repair in 2009 in [place].  CT scan in August 2022 was stable.  I reviewed his case today with Dr. Raynelle Jan.  We feel that he can be cleared for surgery without getting his follow-up scan first.   Neuro/Psych   Anxiety Depression    H/o Bell's palsy    GI/Hepatic Neg liver ROS,GERD  Medicated and Controlled,,S/p gastric sleeve   Endo/Other  diabetes (glu 244), Oral Hypoglycemic Agents, Insulin Dependent  Class 4 obesity (BMI 49)  Renal/GU negative Renal ROS     Musculoskeletal   Abdominal   Peds  Hematology Hb 13.8, plt 227k   Anesthesia Other Findings   Reproductive/Obstetrics                             Anesthesia Physical Anesthesia Plan  ASA: 4  Anesthesia Plan: General   Post-op Pain Management: Tylenol PO (pre-op)* and Minimal or no pain anticipated   Induction: Intravenous  PONV Risk Score and Plan: 2 and Ondansetron and Dexamethasone  Airway Management Planned: LMA  Additional Equipment:  None  Intra-op Plan:   Post-operative Plan:   Informed Consent: I have reviewed the patients History and Physical, chart, labs and discussed the procedure including the risks, benefits and alternatives for the proposed anesthesia with the patient or authorized representative who has indicated his/her understanding and acceptance.     Dental advisory given  Plan Discussed with: CRNA and Surgeon  Anesthesia Plan Comments: (See PAT note 01/11/2024)       Anesthesia Quick Evaluation

## 2024-01-22 NOTE — Assessment & Plan Note (Signed)
History of type B aortic dissection, descending thoracic and infrarenal abdominal aortic aneurysm and right common iliac artery aneurysm.  He underwent repair in 2009 in [place].  CT scan in August 2022 was stable.  I reviewed his case today with Dr. Raynelle Jan.  We feel that he can be cleared for surgery without getting his follow-up scan first.  I will go ahead and order his follow-up scan for surveillance.  Follow-up in 6 months.

## 2024-01-22 NOTE — Assessment & Plan Note (Signed)
Blood pressure controlled.  Continue Cardizem to 40 mg daily, furosemide 40 mg daily, labetalol 300 mg daily.

## 2024-01-22 NOTE — Assessment & Plan Note (Signed)
He does have a history of coronary calcification on prior CT.  LDL goal should be at least less than 100.  He is not currently on statin therapy.  I will request his most recent labs from primary care.

## 2024-01-25 ENCOUNTER — Ambulatory Visit (HOSPITAL_COMMUNITY): Payer: Medicare Other | Admitting: Physician Assistant

## 2024-01-25 ENCOUNTER — Ambulatory Visit (HOSPITAL_BASED_OUTPATIENT_CLINIC_OR_DEPARTMENT_OTHER): Payer: Medicare Other | Admitting: Anesthesiology

## 2024-01-25 ENCOUNTER — Other Ambulatory Visit: Payer: Self-pay

## 2024-01-25 ENCOUNTER — Encounter (HOSPITAL_COMMUNITY): Payer: Self-pay | Admitting: Orthopedic Surgery

## 2024-01-25 ENCOUNTER — Ambulatory Visit (HOSPITAL_COMMUNITY)
Admission: RE | Admit: 2024-01-25 | Discharge: 2024-01-25 | Disposition: A | Discharge status: Home / Self Care | Payer: Medicare Other | Source: Ambulatory Visit | Attending: Orthopedic Surgery | Admitting: Orthopedic Surgery

## 2024-01-25 ENCOUNTER — Encounter (HOSPITAL_COMMUNITY)
Admission: RE | Disposition: A | Discharge status: Home / Self Care | Payer: Self-pay | Source: Ambulatory Visit | Attending: Orthopedic Surgery

## 2024-01-25 DIAGNOSIS — Z87891 Personal history of nicotine dependence: Secondary | ICD-10-CM | POA: Insufficient documentation

## 2024-01-25 DIAGNOSIS — E6689 Other obesity not elsewhere classified: Secondary | ICD-10-CM | POA: Diagnosis not present

## 2024-01-25 DIAGNOSIS — F419 Anxiety disorder, unspecified: Secondary | ICD-10-CM | POA: Insufficient documentation

## 2024-01-25 DIAGNOSIS — S83232A Complex tear of medial meniscus, current injury, left knee, initial encounter: Secondary | ICD-10-CM

## 2024-01-25 DIAGNOSIS — Z794 Long term (current) use of insulin: Secondary | ICD-10-CM | POA: Insufficient documentation

## 2024-01-25 DIAGNOSIS — Z79899 Other long term (current) drug therapy: Secondary | ICD-10-CM | POA: Diagnosis not present

## 2024-01-25 DIAGNOSIS — Z7984 Long term (current) use of oral hypoglycemic drugs: Secondary | ICD-10-CM | POA: Diagnosis not present

## 2024-01-25 DIAGNOSIS — F32A Depression, unspecified: Secondary | ICD-10-CM | POA: Diagnosis not present

## 2024-01-25 DIAGNOSIS — Z6841 Body Mass Index (BMI) 40.0 and over, adult: Secondary | ICD-10-CM | POA: Insufficient documentation

## 2024-01-25 DIAGNOSIS — E1151 Type 2 diabetes mellitus with diabetic peripheral angiopathy without gangrene: Secondary | ICD-10-CM | POA: Insufficient documentation

## 2024-01-25 DIAGNOSIS — X58XXXA Exposure to other specified factors, initial encounter: Secondary | ICD-10-CM | POA: Insufficient documentation

## 2024-01-25 DIAGNOSIS — Z7982 Long term (current) use of aspirin: Secondary | ICD-10-CM | POA: Diagnosis not present

## 2024-01-25 DIAGNOSIS — Z01818 Encounter for other preprocedural examination: Secondary | ICD-10-CM

## 2024-01-25 DIAGNOSIS — I10 Essential (primary) hypertension: Secondary | ICD-10-CM | POA: Insufficient documentation

## 2024-01-25 DIAGNOSIS — E119 Type 2 diabetes mellitus without complications: Secondary | ICD-10-CM | POA: Diagnosis not present

## 2024-01-25 DIAGNOSIS — K219 Gastro-esophageal reflux disease without esophagitis: Secondary | ICD-10-CM | POA: Insufficient documentation

## 2024-01-25 DIAGNOSIS — S83242A Other tear of medial meniscus, current injury, left knee, initial encounter: Secondary | ICD-10-CM | POA: Insufficient documentation

## 2024-01-25 DIAGNOSIS — Z9884 Bariatric surgery status: Secondary | ICD-10-CM | POA: Insufficient documentation

## 2024-01-25 DIAGNOSIS — E1165 Type 2 diabetes mellitus with hyperglycemia: Secondary | ICD-10-CM

## 2024-01-25 DIAGNOSIS — Z833 Family history of diabetes mellitus: Secondary | ICD-10-CM | POA: Insufficient documentation

## 2024-01-25 DIAGNOSIS — G4733 Obstructive sleep apnea (adult) (pediatric): Secondary | ICD-10-CM | POA: Insufficient documentation

## 2024-01-25 HISTORY — PX: KNEE ARTHROSCOPY WITH MEDIAL MENISECTOMY: SHX5651

## 2024-01-25 LAB — GLUCOSE, CAPILLARY
Glucose-Capillary: 244 mg/dL — ABNORMAL HIGH (ref 70–99)
Glucose-Capillary: 186 mg/dL — ABNORMAL HIGH (ref 70–99)

## 2024-01-25 SURGERY — ARTHROSCOPY, KNEE, WITH MEDIAL MENISCECTOMY
Anesthesia: General | Site: Knee | Laterality: Left

## 2024-01-25 MED ORDER — LACTATED RINGERS IV SOLN: INTRAVENOUS | Status: DC

## 2024-01-25 MED ORDER — PHENYLEPHRINE 80 MCG/ML (10ML) SYRINGE FOR IV PUSH (FOR BLOOD PRESSURE SUPPORT)
PREFILLED_SYRINGE | INTRAVENOUS | Status: AC
  Filled 2024-01-25: qty 10

## 2024-01-25 MED ORDER — HYDROMORPHONE HCL 2 MG PO TABS: 2.0000 mg | ORAL_TABLET | Freq: Two times a day (BID) | ORAL | 0 refills | Status: AC | PRN

## 2024-01-25 MED ORDER — MORPHINE SULFATE (PF) 4 MG/ML IV SOLN
INTRAVENOUS | Status: AC
  Filled 2024-01-25: qty 1

## 2024-01-25 MED ORDER — MIDAZOLAM HCL 2 MG/2ML IJ SOLN
INTRAMUSCULAR | Status: AC
  Filled 2024-01-25: qty 2

## 2024-01-25 MED ORDER — SODIUM CHLORIDE 0.9 % IR SOLN
Status: DC | PRN
  Administered 2024-01-25 (×2): 3000 mL

## 2024-01-25 MED ORDER — PROPOFOL 10 MG/ML IV BOLUS
INTRAVENOUS | Status: DC | PRN
  Administered 2024-01-25: 200 mg via INTRAVENOUS

## 2024-01-25 MED ORDER — FENTANYL CITRATE (PF) 100 MCG/2ML IJ SOLN
INTRAMUSCULAR | Status: DC | PRN
  Administered 2024-01-25 (×2): 50 ug via INTRAVENOUS

## 2024-01-25 MED ORDER — OXYCODONE HCL 5 MG PO TABS: 5.0000 mg | ORAL_TABLET | Freq: Once | ORAL | Status: DC | PRN

## 2024-01-25 MED ORDER — INSULIN ASPART 100 UNIT/ML IJ SOLN
0.0000 [IU] | INTRAMUSCULAR | Status: DC | PRN
  Administered 2024-01-25: 7 [IU] via SUBCUTANEOUS
  Filled 2024-01-25: qty 1

## 2024-01-25 MED ORDER — ONDANSETRON HCL 4 MG/2ML IJ SOLN
INTRAMUSCULAR | Status: AC
  Filled 2024-01-25: qty 2

## 2024-01-25 MED ORDER — ONDANSETRON HCL 4 MG/2ML IJ SOLN
INTRAMUSCULAR | Status: DC | PRN
  Administered 2024-01-25: 4 mg via INTRAVENOUS

## 2024-01-25 MED ORDER — DEXMEDETOMIDINE HCL IN NACL 80 MCG/20ML IV SOLN
INTRAVENOUS | Status: DC | PRN
  Administered 2024-01-25: 8 ug via INTRAVENOUS

## 2024-01-25 MED ORDER — FENTANYL CITRATE (PF) 100 MCG/2ML IJ SOLN
INTRAMUSCULAR | Status: AC
  Filled 2024-01-25: qty 2

## 2024-01-25 MED ORDER — BUPIVACAINE-EPINEPHRINE (PF) 0.5% -1:200000 IJ SOLN
INTRAMUSCULAR | Status: AC
  Filled 2024-01-25: qty 30

## 2024-01-25 MED ORDER — PROPOFOL 10 MG/ML IV BOLUS
INTRAVENOUS | Status: AC
  Filled 2024-01-25: qty 20

## 2024-01-25 MED ORDER — MIDAZOLAM HCL 2 MG/2ML IJ SOLN: 0.5000 mg | Freq: Once | INTRAMUSCULAR | Status: DC | PRN

## 2024-01-25 MED ORDER — LIDOCAINE HCL (CARDIAC) PF 100 MG/5ML IV SOSY
PREFILLED_SYRINGE | INTRAVENOUS | Status: DC | PRN
  Administered 2024-01-25: 60 mg via INTRAVENOUS

## 2024-01-25 MED ORDER — MEPERIDINE HCL 50 MG/ML IJ SOLN: 6.2500 mg | INTRAMUSCULAR | Status: DC | PRN

## 2024-01-25 MED ORDER — ORAL CARE MOUTH RINSE: 15.0000 mL | Freq: Once | OROMUCOSAL | Status: AC

## 2024-01-25 MED ORDER — OXYCODONE HCL 5 MG/5ML PO SOLN: 5.0000 mg | Freq: Once | ORAL | Status: DC | PRN

## 2024-01-25 MED ORDER — PROPOFOL 10 MG/ML IV BOLUS
INTRAVENOUS | Status: AC
Start: 2024-01-25 — End: ?
  Filled 2024-01-25: qty 20

## 2024-01-25 MED ORDER — CHLORHEXIDINE GLUCONATE 0.12 % MT SOLN
15.0000 mL | Freq: Once | OROMUCOSAL | Status: AC
  Administered 2024-01-25: 15 mL via OROMUCOSAL

## 2024-01-25 MED ORDER — DEXMEDETOMIDINE HCL IN NACL 80 MCG/20ML IV SOLN
INTRAVENOUS | Status: AC
Start: 2024-01-25 — End: ?
  Filled 2024-01-25: qty 20

## 2024-01-25 MED ORDER — MIDAZOLAM HCL 5 MG/5ML IJ SOLN
INTRAMUSCULAR | Status: DC | PRN
  Administered 2024-01-25 (×2): 1 mg via INTRAVENOUS

## 2024-01-25 MED ORDER — PHENYLEPHRINE HCL (PRESSORS) 10 MG/ML IV SOLN
INTRAVENOUS | Status: DC | PRN
  Administered 2024-01-25: 80 ug via INTRAVENOUS

## 2024-01-25 MED ORDER — HYDROMORPHONE HCL 1 MG/ML IJ SOLN: 0.2500 mg | INTRAMUSCULAR | Status: DC | PRN

## 2024-01-25 MED ORDER — BUPIVACAINE-EPINEPHRINE 0.5% -1:200000 IJ SOLN
INTRAMUSCULAR | Status: DC | PRN
  Administered 2024-01-25: 30 mL

## 2024-01-25 MED ORDER — LIDOCAINE HCL (PF) 2 % IJ SOLN
INTRAMUSCULAR | Status: AC
  Filled 2024-01-25: qty 5

## 2024-01-25 SURGICAL SUPPLY — 29 items
BAG COUNTER SPONGE SURGICOUNT (BAG) IMPLANT
BNDG ELASTIC 6INX 5YD STR LF (GAUZE/BANDAGES/DRESSINGS) IMPLANT
BNDG ELASTIC 6X10 VLCR STRL LF (GAUZE/BANDAGES/DRESSINGS) IMPLANT
COVER SURGICAL LIGHT HANDLE (MISCELLANEOUS) ×1 IMPLANT
DISSECTOR 3.5MM X 13CM (MISCELLANEOUS) IMPLANT
DISSECTOR 4.0MM X 13CM (MISCELLANEOUS) ×1 IMPLANT
DRAPE U-SHAPE 47X51 STRL (DRAPES) ×1 IMPLANT
DURAPREP 26ML APPLICATOR (WOUND CARE) ×1 IMPLANT
GAUZE 4X4 16PLY ~~LOC~~+RFID DBL (SPONGE) ×1 IMPLANT
GAUZE PAD ABD 8X10 STRL (GAUZE/BANDAGES/DRESSINGS) ×2 IMPLANT
GAUZE SPONGE 4X4 12PLY STRL (GAUZE/BANDAGES/DRESSINGS) ×1 IMPLANT
GAUZE XEROFORM 1X8 LF (GAUZE/BANDAGES/DRESSINGS) ×1 IMPLANT
GLOVE BIOGEL PI IND STRL 7.5 (GLOVE) ×1 IMPLANT
GLOVE BIOGEL PI IND STRL 8.5 (GLOVE) ×2 IMPLANT
GLOVE ORTHO TXT STRL SZ7.5 (GLOVE) ×1 IMPLANT
GLOVE SURG LX STRL 7.5 STRW (GLOVE) ×1 IMPLANT
GLOVE SURG ORTHO 8.0 STRL STRW (GLOVE) ×2 IMPLANT
GOWN STRL REUS W/ TWL XL LVL3 (GOWN DISPOSABLE) ×2 IMPLANT
KIT BASIN OR (CUSTOM PROCEDURE TRAY) ×1 IMPLANT
MANIFOLD NEPTUNE II (INSTRUMENTS) ×2 IMPLANT
PACK ARTHROSCOPY WL (CUSTOM PROCEDURE TRAY) ×1 IMPLANT
PADDING CAST COTTON 6X4 STRL (CAST SUPPLIES) ×1 IMPLANT
PENCIL SMOKE EVACUATOR (MISCELLANEOUS) IMPLANT
PROBE BIPOLAR ATHRO 135MM 90D (MISCELLANEOUS) IMPLANT
PROTECTOR NERVE ULNAR (MISCELLANEOUS) ×1 IMPLANT
SUT ETHILON 4 0 PS 2 18 (SUTURE) ×1 IMPLANT
TOWEL OR 17X26 10 PK STRL BLUE (TOWEL DISPOSABLE) ×1 IMPLANT
TUBING ARTHROSCOPY IRRIG 16FT (MISCELLANEOUS) ×1 IMPLANT
WAND ABLATOR APOLLO I90 (BUR) IMPLANT

## 2024-01-25 NOTE — Anesthesia Procedure Notes (Signed)
Procedure Name: LMA Insertion Date/Time: 01/25/2024 8:56 AM  Performed by: Garth Bigness, CRNAPre-anesthesia Checklist: Patient identified, Emergency Drugs available, Suction available and Patient being monitored Patient Re-evaluated:Patient Re-evaluated prior to induction Oxygen Delivery Method: Circle system utilized Preoxygenation: Pre-oxygenation with 100% oxygen Induction Type: IV induction Ventilation: Mask ventilation without difficulty LMA: LMA inserted LMA Size: 5.0 Tube type: Oral Number of attempts: 1 Placement Confirmation: positive ETCO2 and breath sounds checked- equal and bilateral Tube secured with: Tape Dental Injury: Teeth and Oropharynx as per pre-operative assessment

## 2024-01-25 NOTE — Anesthesia Postprocedure Evaluation (Signed)
Anesthesia Post Note  Patient: Bryan Wells  Procedure(s) Performed: KNEE ARTHROSCOPY WITH MEDIAL MENISECTOMY (Left: Knee)     Patient location during evaluation: PACU Anesthesia Type: General Level of consciousness: awake and alert, patient cooperative and oriented Pain management: pain level controlled Vital Signs Assessment: post-procedure vital signs reviewed and stable Respiratory status: spontaneous breathing, nonlabored ventilation and respiratory function stable Cardiovascular status: blood pressure returned to baseline and stable Postop Assessment: no apparent nausea or vomiting and adequate PO intake Anesthetic complications: no   No notable events documented.  Last Vitals:  Vitals:   01/25/24 0951 01/25/24 1000  BP:  131/70  Pulse:  86  Resp:  16  Temp:    SpO2: 97% 93%    Last Pain:  Vitals:   01/25/24 1000  TempSrc:   PainSc: 0-No pain                 Renate Danh,E. Boots Mcglown

## 2024-01-25 NOTE — H&P (Signed)
Creedence Kunesh MRN:  [id number] DOB/SEX:  [birth year]/male  CHIEF COMPLAINT:  Painful left Knee  HISTORY: Patient is a 63 y.o. male presented with a history of pain in the left knee. Onset of symptoms was abrupt starting a few months ago with gradually worsening course since that time. Patient has been treated conservatively with over-the-counter NSAIDs and activity modification. Patient currently rates pain in the knee at 10 out of 10 with activity. There is pain at night.  PAST MEDICAL HISTORY: Patient Active Problem List   Diagnosis Date Noted   S/P laparoscopic sleeve gastrectomy 01/28/2022   Anxiety 01/14/2022   Chronic pain 01/14/2022   History of dissection of thoracic aorta 07/23/2021   Ruptured aneurysm of thoracoabdominal aorta (HCC) 07/23/2021   Essential hypertension 07/23/2021   Aortic atherosclerosis (HCC) 07/23/2021   Coronary artery calcification 07/23/2021   Dissection of thoracic aorta (HCC) 06/13/2021   Dyslipidemia 06/13/2021   Dysphagia 06/13/2021   Gastro-esophageal reflux disease without esophagitis 06/13/2021   Long term (current) use of insulin (HCC) 06/13/2021   Osteoarthritis of knee 06/13/2021   Pure hypercholesterolemia 06/13/2021   Thoracic aortic aneurysm without rupture (HCC) 06/13/2021   Type 2 diabetes mellitus with other specified complication (HCC) 06/13/2021   History of Bell's palsy 02/28/2020   Sudden left hearing loss 02/28/2020   Tinnitus of left ear 02/28/2020   Noncompliance with diabetes treatment 10/02/2018   Bell's palsy 08/21/2017   Hyperlipidemia 08/21/2017   Morbid obesity with BMI of 50.0-59.9, adult (HCC) 08/21/2017   Sleep apnea 08/21/2017   Type 2 diabetes mellitus with hyperglycemia (HCC) 08/21/2017   Sinusitis, chronic 12/11/2009   ERECTILE DYSFUNCTION, ORGANIC 12/11/2009   MORBID OBESITY 12/10/2009   DEPRESSION 12/10/2009   ULNAR NERVE ENTRAPMENT 12/10/2009   Aortic aneurysm (HCC) 12/10/2009   HYPERTENSION NEC 12/10/2009    History of AAA (abdominal aortic aneurysm) repair 12/10/2009   Hypertension 12/10/2009   Past Medical History:  Diagnosis Date   AAA (abdominal aortic aneurysm) (HCC)    followed by vascular -- dr Jenne Pane-- infrarenal AAA and proximal descending thoracic    Anxiety    Arthritis    Bell's palsy 05/2017   left side-- per pt residual mild stiffness   Chronic pain disorder    sees Pain mgmt   Coronary artery disease    Depression    GERD (gastroesophageal reflux disease)    History of aortic dissection 09/2008   type B  s/p  stenting BMS's distal descending thoracic into bilateral iliac arteries   History of MI (myocardial infarction)    per vascular note in epic,  post op complicattion aortic/ iliac stenting MI with tracheostomy for prolonged vent.   Hyperlipidemia    hypercholesterolemia   Hypertension    Morbid obesity with BMI of 50.0-59.9, adult (HCC)    Myocardial infarction (HCC)    OSA on CPAP    CPAP is currently broken and he doesn't have another one yet   Peripheral vascular disease (HCC)    Type 2 diabetes mellitus treated with insulin (HCC)    followed by pcp   Wears dentures    upper   Wears glasses    Past Surgical History:  Procedure Laterality Date   COLONOSCOPY WITH PROPOFOL N/A 02/22/2019   Procedure: COLONOSCOPY WITH PROPOFOL;  Surgeon: Graylin Shiver, MD;  Location: [facility];  Service: Endoscopy;  Laterality: N/A;   DESCENDING AORTIC ANEURYSM REPAIR W/ STENT  10/ 2009  in [place]   BMS x2 distal portion extending into  bilateral common iliac arteries   ESOPHAGOGASTRODUODENOSCOPY (EGD) WITH PROPOFOL N/A 02/22/2019   Procedure: ESOPHAGOGASTRODUODENOSCOPY (EGD) WITH PROPOFOL;  Surgeon: Graylin Shiver, MD;  Location: [facility];  Service: Endoscopy;  Laterality: N/A;   LAPAROSCOPIC GASTRIC SLEEVE RESECTION  2023   UPPER GI ENDOSCOPY N/A 01/28/2022   Procedure: UPPER GI ENDOSCOPY;  Surgeon: Luretha Murphy, MD;  Location: [facility];  Service: General;   Laterality: N/A;     MEDICATIONS:   Medications Prior to Admission  Medication Sig Dispense Refill Last Dose/Taking   acetaminophen (TYLENOL) 500 MG tablet Take 500 mg by mouth every 6 (six) hours as needed for moderate pain.   Taking As Needed   aspirin EC 81 MG tablet Take 81 mg by mouth daily.   Taking   Coenzyme Q10 (CO Q 10) 100 MG CAPS Take 100 mg by mouth daily.   Taking   diclofenac Sodium (VOLTAREN) 1 % GEL Apply 1 application topically 4 (four) times daily as needed (pain).   Taking As Needed   diltiazem (CARDIZEM CD) 240 MG 24 hr capsule Take 240 mg by mouth at bedtime.    Taking   DULoxetine (CYMBALTA) 30 MG capsule Take 30 mg by mouth 2 (two) times daily.   Taking   gabapentin (NEURONTIN) 600 MG tablet Take 600-1,200 mg by mouth 3 (three) times daily as needed (pain).   Taking As Needed   glimepiride (AMARYL) 2 MG tablet Take 2 mg by mouth daily with breakfast.   Taking   hydrOXYzine (ATARAX) 25 MG tablet Take 25 mg by mouth daily as needed for anxiety.   Taking As Needed   insulin degludec (TRESIBA FLEXTOUCH) 200 UNIT/ML FlexTouch Pen Inject 60 Units into the skin daily.   Taking   labetalol (NORMODYNE) 300 MG tablet Take 300 mg by mouth daily.   Taking   metFORMIN (GLUCOPHAGE) 1000 MG tablet Take 1,000 mg by mouth 2 (two) times daily.  1 Taking   oxyCODONE-acetaminophen (PERCOCET) 10-325 MG tablet Take 1 tablet by mouth 4 (four) times daily as needed for pain.   Taking As Needed   pantoprazole (PROTONIX) 40 MG tablet Take 1 tablet (40 mg total) by mouth daily. (Patient taking differently: Take 40 mg by mouth 2 (two) times daily before a meal.) 90 tablet 0 Taking Differently   tiZANidine (ZANAFLEX) 4 MG tablet Take 4 mg by mouth at bedtime as needed for muscle spasms.   Taking As Needed   Accu-Chek Softclix Lancets lancets daily.      Continuous Blood Gluc Receiver (FREESTYLE LIBRE 2 READER) DEVI See admin instructions.      glucose blood test strip use to check blood sugars       Insulin Pen Needle (BD PEN NEEDLE MICRO U/F) 32G X 6 MM MISC See admin instructions.      Needle, Disp, 32G X 5/16" MISC See admin instructions.      NONFORMULARY OR COMPOUNDED ITEM Antifungal solution: Terbinafine 3%, Fluconazole 2%, Tea Tree Oil 5%, Urea 10%, Ibuprofen 2% in DMSO suspension #29mL (Patient taking differently: as needed. Antifungal solution: Terbinafine 3%, Fluconazole 2%, Tea Tree Oil 5%, Urea 10%, Ibuprofen 2% in DMSO suspension #82mL) 1 each 3    ondansetron (ZOFRAN-ODT) 4 MG disintegrating tablet Take 1 tablet (4 mg total) by mouth every 6 (six) hours as needed for nausea or vomiting. 20 tablet 0    triamcinolone acetonide (KENALOG-40) 40 MG/ML injection Inject 1 mL into the muscle See admin instructions. Every 6 months as needed for  arthritis       ALLERGIES:   Allergies  Allergen Reactions   Ciprofloxacin Hives   Hydrochlorothiazide Swelling   Ibuprofen     stomach upset   Lisinopril Swelling    angioedma   Nsaids     Other reaction(s): GI intole due  gastric bypass   Penicillins Other (See Comments)    Loss of consciousness  Has patient had a PCN reaction causing immediate rash, facial/tongue/throat swelling, SOB or lightheadedness with hypotension: Yes Has patient had a PCN reaction causing severe rash involving mucus membranes or skin necrosis: No Has patient had a PCN reaction that required hospitalization: Unknown Has patient had a PCN reaction occurring within the last 10 years: No If all of the above answers are "NO", then may proceed with Cephalosporin use.  Other reaction(s): hives Other reaction(s): hives   Strawberry (Diagnostic) Itching    REVIEW OF SYSTEMS:  A comprehensive review of systems was negative except for: Musculoskeletal: positive for myalgias and stiff joints   FAMILY HISTORY:   Family History  Problem Relation Age of Onset   Diabetes Father    Hyperlipidemia Sister    Hypertension Sister    Obesity Sister    Coronary  artery disease Sister    Hyperlipidemia Brother     SOCIAL HISTORY:   Social History   Tobacco Use   Smoking status: Former    Current packs/day: 0.00    Types: Cigarettes    Start date: 02/21/1992    Quit date: 02/21/2012    Years since quitting: 11.9   Smokeless tobacco: Never  Substance Use Topics   Alcohol use: No     EXAMINATION:  Vital signs in last 24 hours: Temp:  [98.2 F (36.8 C)] 98.2 F (36.8 C) (02/03 0626) Pulse Rate:  [89] 89 (02/03 0626) Resp:  [18] 18 (02/03 0626) BP: (153)/(73) 153/73 (02/03 0626) SpO2:  [98 %] 98 % (02/03 0626)  BP (!) 153/73   Pulse 89   Temp 98.2 F (36.8 C) (Oral)   Resp 18   SpO2 98%   General Appearance:    Alert, cooperative, no distress, appears stated age  Head:    Normocephalic, without obvious abnormality, atraumatic  Eyes:    PERRL, conjunctiva/corneas clear, EOM's intact, fundi    benign, both eyes       Ears:    Normal TM's and external ear canals, both ears  Nose:   Nares normal, septum midline, mucosa normal, no drainage    or sinus tenderness  Throat:   Lips, mucosa, and tongue normal; teeth and gums normal  Neck:   Supple, symmetrical, trachea midline, no adenopathy;       thyroid:  No enlargement/tenderness/nodules; no carotid   bruit or JVD  Back:     Symmetric, no curvature, ROM normal, no CVA tenderness  Lungs:     Clear to auscultation bilaterally, respirations unlabored  Chest wall:    No tenderness or deformity  Heart:    Regular rate and rhythm, S1 and S2 normal, no murmur, rub   or gallop  Abdomen:     Soft, non-tender, bowel sounds active all four quadrants,    no masses, no organomegaly  Genitalia:    Normal male without lesion, discharge or tenderness  Rectal:    Normal tone, normal prostate, no masses or tenderness;   guaiac negative stool  Extremities:   Extremities normal, atraumatic, no cyanosis or edema  Pulses:   2+ and symmetric all extremities  Skin:   Skin color, texture, turgor normal, no  rashes or lesions  Lymph nodes:   Cervical, supraclavicular, and axillary nodes normal  Neurologic:   CNII-XII intact. Normal strength, sensation and reflexes      throughout    Musculoskeletal:  ROM 0-120, Ligaments intact, positive mcmurrays,  Imaging Review Plain radiographs demonstrate mild degenerative joint disease of the left knee. MRI evidence of internal derangement  Assessment/Plan: Internal Derangement, left knee   The patient history, physical examination and imaging studies are consistent with mild degenerative joint disease of the left knee. MRI evidence of internal derangement. The patient has failed conservative treatment.  The clearance notes were reviewed.  After discussion with the patient it was felt that knee arthroscopy was indicated. The procedure,  risks, and benefits were presented and reviewed. The risks including but not limited to  infection, blood clots, vascular injury, stiffness,  complications among others were discussed. The patient acknowledged the explanation, agreed to proceed with the plan.   Guy Sandifer 01/25/2024, 7:01 AM

## 2024-01-25 NOTE — Op Note (Signed)
TOTAL KNEE REPLACEMENT OPERATIVE NOTE:  01/25/2024  9:24 AM  PATIENT:  Bryan Wells  63 y.o. male  PRE-OPERATIVE DIAGNOSIS:  Torn left medial meniscus  S83.232A  POST-OPERATIVE DIAGNOSIS:  Torn left medial meniscus S83.232A  PROCEDURE:  Procedure(s): KNEE ARTHROSCOPY WITH MEDIAL MENISECTOMY  SURGEON:  Surgeon(s): Dannielle Huh, MD  PHYSICIAN ASSISTANT: Laurier Nancy, PA-C   ANESTHESIA:   general  SPECIMEN: None  COUNTS:  Correct  TOURNIQUET:  * Missing tourniquet times found for documented tourniquets in log: [id number] *  DICTATION:  Indication for procedure:    The patient is a 63 y.o. male who has failed conservative treatment for Torn left medial meniscus  S83.232A.  Informed consent was obtained prior to anesthesia. The risks versus benefits of the operation were explain and in a way the patient can, and did, understand.   Description of procedure:  Patient was placed on the operating room table in supine position.  General anesthesia administered.  The left leg was prepped and draped in usual sterile fashion with a preoperative knee block with Marcaine with epinephrine.  After sterile prep and drape #11 blade blunt trocar and cannula used to create an inferolateral inferomedial portal.  Diagnostic arthroscopy revealed grade III chondromalacia in the patellofemoral joint with a mild plica which was debrided with a gray-white shaver.  Medial compartment had grade II chondromalacia and 1 small spot of grade IV chondromalacia about 2 x 3 mm.  There is a large mid body medial meniscus tear with a large flap of approximately 15% of the meniscus extruded into the joint.  A small basket forceps and a gray-white shaver was used to perform a partial medial meniscectomy removing approximately 20 to 25% of the medial meniscus.  Went to the notch the ACL PCL were normal.  I then went into the figure-of-four position that compartment was normal.  I then went back into extension and finished  debriding the plica.  Then lavaged with fluid and remove the fluid and instruments and closed with 4 nylon stitch sutures dressed with Xeroform dressing sponges sterile Webril and Ace wrap.  Complications: None  Patient was taken the recovery room in stable condition  Dannielle Huh, MD       Please fax a copy of this op note to my office at [phone] (please only include page 1 and 2 of the Case Information op note)

## 2024-01-25 NOTE — Transfer of Care (Signed)
Immediate Anesthesia Transfer of Care Note  Patient: Bryan Wells  Procedure(s) Performed: KNEE ARTHROSCOPY WITH MEDIAL MENISECTOMY (Left: Knee)  Patient Location: PACU  Anesthesia Type:General  Level of Consciousness: awake, alert , oriented, and patient cooperative  Airway & Oxygen Therapy: Patient Spontanous Breathing and Patient connected to face mask oxygen  Post-op Assessment: Report given to RN and Post -op Vital signs reviewed and stable  Post vital signs: Reviewed and stable  Last Vitals:  Vitals Value Taken Time  BP 141/75 01/25/24 0933  Temp 36.5 C 01/25/24 0933  Pulse 87 01/25/24 0935  Resp 25 01/25/24 0935  SpO2 96 % 01/25/24 0935  Vitals shown include unfiled device data.  Last Pain:  Vitals:   01/25/24 0626  TempSrc: Oral         Complications: No notable events documented.

## 2024-01-26 ENCOUNTER — Telehealth: Payer: Self-pay | Admitting: *Deleted

## 2024-01-26 ENCOUNTER — Other Ambulatory Visit: Payer: Self-pay | Admitting: *Deleted

## 2024-01-26 ENCOUNTER — Encounter (HOSPITAL_COMMUNITY): Payer: Self-pay | Admitting: Orthopedic Surgery

## 2024-01-26 ENCOUNTER — Other Ambulatory Visit: Payer: Self-pay

## 2024-01-26 DIAGNOSIS — E7800 Pure hypercholesterolemia, unspecified: Secondary | ICD-10-CM

## 2024-01-26 MED ORDER — ATORVASTATIN CALCIUM 20 MG PO TABS: 20.0000 mg | ORAL_TABLET | Freq: Every day | ORAL | 3 refills | Status: AC

## 2024-01-26 NOTE — Telephone Encounter (Signed)
-----   Message from Glendia Ferrier sent at 01/25/2024  7:47 AM EST ----- Results sent to Bryan Wells via MyChart. See MyChart comments below. PLAN:  -Start atorvastatin  20 mg daily -Fasting lipids, ALT in 3 months  Bryan Wells  Your LDL cholesterol is above goal.  It should be at least less than 100.  I would like to place you on atorvastatin  20 mg daily to bring this down.  I will arrange follow-up labs in 3 months. Glendia Ferrier, PA-C

## 2024-01-26 NOTE — Telephone Encounter (Signed)
Pt made aware of lab results.  He will start Atorvastatin 20 mg daily and repeat labs in 3 months.

## 2024-02-02 ENCOUNTER — Telehealth: Payer: Self-pay | Admitting: *Deleted

## 2024-02-02 ENCOUNTER — Other Ambulatory Visit: Payer: Self-pay | Admitting: *Deleted

## 2024-02-02 DIAGNOSIS — I10 Essential (primary) hypertension: Secondary | ICD-10-CM

## 2024-02-02 NOTE — Telephone Encounter (Signed)
-----   Message from Tobey Bride sent at 02/02/2024 10:19 AM EST ----- Regarding: Need labs Patient is schedule on 02-24-24 for his CT angio.  He will need labs prior to CT.  Please put in order and I will call him.  Thank you

## 2024-02-24 ENCOUNTER — Ambulatory Visit (HOSPITAL_COMMUNITY): Admission: RE | Admit: 2024-02-24 | Payer: Medicare Other | Source: Ambulatory Visit

## 2024-02-24 ENCOUNTER — Ambulatory Visit (HOSPITAL_COMMUNITY)
Admission: RE | Admit: 2024-02-24 | Discharge: 2024-02-24 | Disposition: A | Discharge status: Home / Self Care | Payer: Medicare Other | Source: Ambulatory Visit | Attending: Physician Assistant | Admitting: Physician Assistant

## 2024-02-24 DIAGNOSIS — E1165 Type 2 diabetes mellitus with hyperglycemia: Secondary | ICD-10-CM

## 2024-05-24 ENCOUNTER — Ambulatory Visit: Admitting: Podiatry

## 2024-05-24 ENCOUNTER — Encounter: Payer: Self-pay | Admitting: Podiatry

## 2024-05-24 DIAGNOSIS — M2141 Flat foot [pes planus] (acquired), right foot: Secondary | ICD-10-CM | POA: Diagnosis not present

## 2024-05-24 DIAGNOSIS — I872 Venous insufficiency (chronic) (peripheral): Secondary | ICD-10-CM

## 2024-05-24 DIAGNOSIS — E119 Type 2 diabetes mellitus without complications: Secondary | ICD-10-CM

## 2024-05-24 DIAGNOSIS — M2142 Flat foot [pes planus] (acquired), left foot: Secondary | ICD-10-CM

## 2024-05-24 DIAGNOSIS — B351 Tinea unguium: Secondary | ICD-10-CM

## 2024-05-24 DIAGNOSIS — M79674 Pain in right toe(s): Secondary | ICD-10-CM | POA: Diagnosis not present

## 2024-05-24 DIAGNOSIS — E1142 Type 2 diabetes mellitus with diabetic polyneuropathy: Secondary | ICD-10-CM | POA: Diagnosis not present

## 2024-05-24 DIAGNOSIS — M79675 Pain in left toe(s): Secondary | ICD-10-CM | POA: Diagnosis not present

## 2024-05-24 NOTE — Progress Notes (Signed)
 ANNUAL DIABETIC FOOT EXAM  Subjective: Bryan Wells presents today for annual diabetic foot exam. Chief Complaint  Patient presents with   Diabetes    "Cut the toenails."  Dr. Laurena Pong - last Thursday; A1c - 9.9   Patient confirms h/o diabetes.  Patient denies any h/o foot wounds.  Patient has been diagnosed with neuropathy.  Ulysees Gander, MD is patient's PCP.  Past Medical History:  Diagnosis Date   AAA (abdominal aortic aneurysm) (HCC)    followed by vascular -- dr Nereida Banning-- infrarenal AAA and proximal descending thoracic    Anxiety    Arthritis    Bell's palsy 05/2017   left side-- per pt residual mild stiffness   Chronic pain disorder    sees Pain mgmt   Coronary artery disease    Depression    GERD (gastroesophageal reflux disease)    History of aortic dissection 09/2008   type B  s/p  stenting BMS's distal descending thoracic into bilateral iliac arteries   History of MI (myocardial infarction)    per vascular note in epic,  post op complicattion aortic/ iliac stenting MI with tracheostomy for prolonged vent.   Hyperlipidemia    hypercholesterolemia   Hypertension    Morbid obesity with BMI of 50.0-59.9, adult (HCC)    Myocardial infarction (HCC)    OSA on CPAP    CPAP is currently broken and he doesn't have another one yet   Peripheral vascular disease (HCC)    Type 2 diabetes mellitus treated with insulin  (HCC)    followed by pcp   Wears dentures    upper   Wears glasses    Patient Active Problem List   Diagnosis Date Noted   S/P laparoscopic sleeve gastrectomy 01/28/2022   Anxiety 01/14/2022   Chronic pain 01/14/2022   History of dissection of thoracic aorta 07/23/2021   Ruptured aneurysm of thoracoabdominal aorta (HCC) 07/23/2021   Essential hypertension 07/23/2021   Aortic atherosclerosis (HCC) 07/23/2021   Coronary artery calcification 07/23/2021   Dissection of thoracic aorta (HCC) 06/13/2021   Dyslipidemia 06/13/2021   Dysphagia  06/13/2021   Gastro-esophageal reflux disease without esophagitis 06/13/2021   Long term (current) use of insulin  (HCC) 06/13/2021   Osteoarthritis of knee 06/13/2021   Pure hypercholesterolemia 06/13/2021   Thoracic aortic aneurysm without rupture (HCC) 06/13/2021   Type 2 diabetes mellitus with other specified complication (HCC) 06/13/2021   History of Bell's palsy 02/28/2020   Sudden left hearing loss 02/28/2020   Tinnitus of left ear 02/28/2020   Noncompliance with diabetes treatment 10/02/2018   Bell's palsy 08/21/2017   Hyperlipidemia 08/21/2017   Morbid obesity with BMI of 50.0-59.9, adult (HCC) 08/21/2017   Sleep apnea 08/21/2017   Type 2 diabetes mellitus with hyperglycemia (HCC) 08/21/2017   Sinusitis, chronic 12/11/2009   ERECTILE DYSFUNCTION, ORGANIC 12/11/2009   MORBID OBESITY 12/10/2009   DEPRESSION 12/10/2009   ULNAR NERVE ENTRAPMENT 12/10/2009   Aortic aneurysm (HCC) 12/10/2009   HYPERTENSION NEC 12/10/2009   History of AAA (abdominal aortic aneurysm) repair 12/10/2009   Hypertension 12/10/2009   Past Surgical History:  Procedure Laterality Date   COLONOSCOPY WITH PROPOFOL  N/A 02/22/2019   Procedure: COLONOSCOPY WITH PROPOFOL ;  Surgeon: Celedonio Coil, MD;  Location: [facility];  Service: Endoscopy;  Laterality: N/A;   DESCENDING AORTIC ANEURYSM REPAIR W/ STENT  10/ 2009  in [place]   BMS x2 distal portion extending into bilateral common iliac arteries   ESOPHAGOGASTRODUODENOSCOPY (EGD) WITH PROPOFOL  N/A 02/22/2019  Procedure: ESOPHAGOGASTRODUODENOSCOPY (EGD) WITH PROPOFOL ;  Surgeon: Celedonio Coil, MD;  Location: [facility];  Service: Endoscopy;  Laterality: N/A;   KNEE ARTHROSCOPY WITH MEDIAL MENISECTOMY Left 01/25/2024   Procedure: KNEE ARTHROSCOPY WITH MEDIAL MENISECTOMY;  Surgeon: Christie Cox, MD;  Location: [facility];  Service: Orthopedics;  Laterality: Left;   LAPAROSCOPIC GASTRIC SLEEVE RESECTION  2023   UPPER GI ENDOSCOPY N/A 01/28/2022   Procedure:  UPPER GI ENDOSCOPY;  Surgeon: Jacolyn Matar, MD;  Location: [facility];  Service: General;  Laterality: N/A;   Current Outpatient Medications on File Prior to Visit  Medication Sig Dispense Refill   Accu-Chek Softclix Lancets lancets daily.     acetaminophen  (TYLENOL ) 500 MG tablet Take 500 mg by mouth every 6 (six) hours as needed for moderate pain.     aspirin EC 81 MG tablet Take 81 mg by mouth daily.     Coenzyme Q10 (CO Q 10) 100 MG CAPS Take 100 mg by mouth daily.     Continuous Blood Gluc Receiver (FREESTYLE LIBRE 2 READER) DEVI See admin instructions.     diclofenac Sodium (VOLTAREN) 1 % GEL Apply 1 application topically 4 (four) times daily as needed (pain).     diltiazem (CARDIZEM CD) 240 MG 24 hr capsule Take 240 mg by mouth at bedtime.      DULoxetine (CYMBALTA) 30 MG capsule Take 30 mg by mouth 2 (two) times daily.     gabapentin (NEURONTIN) 600 MG tablet Take 600-1,200 mg by mouth 3 (three) times daily as needed (pain).     glimepiride (AMARYL) 2 MG tablet Take 2 mg by mouth daily with breakfast.     glucose blood test strip use to check blood sugars     hydrOXYzine (ATARAX) 25 MG tablet Take 25 mg by mouth daily as needed for anxiety.     insulin  degludec (TRESIBA FLEXTOUCH) 200 UNIT/ML FlexTouch Pen Inject 60 Units into the skin daily.     Insulin  Pen Needle (BD PEN NEEDLE MICRO U/F) 32G X 6 MM MISC See admin instructions.     labetalol (NORMODYNE) 300 MG tablet Take 300 mg by mouth daily.     metFORMIN (GLUCOPHAGE) 1000 MG tablet Take 1,000 mg by mouth 2 (two) times daily.  1   Needle, Disp, 32G X 5/16" MISC See admin instructions.     NONFORMULARY OR COMPOUNDED ITEM Antifungal solution: Terbinafine 3%, Fluconazole 2%, Tea Tree Oil 5%, Urea  10%, Ibuprofen 2% in DMSO suspension #30mL (Patient taking differently: as needed. Antifungal solution: Terbinafine 3%, Fluconazole 2%, Tea Tree Oil 5%, Urea  10%, Ibuprofen 2% in DMSO suspension #30mL) 1 each 3   ondansetron  (ZOFRAN -ODT) 4 MG  disintegrating tablet Take 1 tablet (4 mg total) by mouth every 6 (six) hours as needed for nausea or vomiting. 20 tablet 0   oxyCODONE -acetaminophen  (PERCOCET) 10-325 MG tablet Take 1 tablet by mouth 4 (four) times daily as needed for pain.     pantoprazole  (PROTONIX ) 40 MG tablet Take 1 tablet (40 mg total) by mouth daily. (Patient taking differently: Take 40 mg by mouth 2 (two) times daily before a meal.) 90 tablet 0   tiZANidine (ZANAFLEX) 4 MG tablet Take 4 mg by mouth at bedtime as needed for muscle spasms.     triamcinolone  acetonide (KENALOG -40) 40 MG/ML injection Inject 1 mL into the muscle See admin instructions. Every 6 months as needed for arthritis     atorvastatin  (LIPITOR) 20 MG tablet Take 1 tablet (20 mg total) by mouth daily. 90  tablet 3   No current facility-administered medications on file prior to visit.    Allergies  Allergen Reactions   Ciprofloxacin Hives   Hydrochlorothiazide  Swelling   Ibuprofen     stomach upset   Lisinopril Swelling    angioedma   Nsaids     Other reaction(s): GI intole due  gastric bypass   Penicillins Other (See Comments)    Loss of consciousness  Has patient had a PCN reaction causing immediate rash, facial/tongue/throat swelling, SOB or lightheadedness with hypotension: Yes Has patient had a PCN reaction causing severe rash involving mucus membranes or skin necrosis: No Has patient had a PCN reaction that required hospitalization: Unknown Has patient had a PCN reaction occurring within the last 10 years: No If all of the above answers are "NO", then may proceed with Cephalosporin use.  Other reaction(s): hives Other reaction(s): hives   Strawberry (Diagnostic) Itching   Social History   Occupational History   Not on file  Tobacco Use   Smoking status: Former    Current packs/day: 0.00    Types: Cigarettes    Start date: 02/21/1992    Quit date: 02/21/2012    Years since quitting: 12.2   Smokeless tobacco: Never  Vaping Use    Vaping status: Never Used  Substance and Sexual Activity   Alcohol  use: No   Drug use: Never   Sexual activity: Not Currently   Family History  Problem Relation Age of Onset   Diabetes Father    Hyperlipidemia Sister    Hypertension Sister    Obesity Sister    Coronary artery disease Sister    Hyperlipidemia Brother    Immunization History  Administered Date(s) Administered   Influenza Whole 12/11/2009   Influenza,inj,quad, With Preservative 09/30/2017   Pneumococcal Polysaccharide-23 12/11/2009   Td 12/11/2009     Review of Systems: Negative except as noted in the HPI.   Objective: There were no vitals filed for this visit.  Chick Cousins is a pleasant 63 y.o. male in NAD. AAO X 3.  Diabetic foot exam was performed with the following findings:   Vascular Examination: CFT less than 3 seconds. DP pulses palpable b/l. PT pulses nonpalpable b/l. Digital hair absent. Skin temperature gradient warm to warn b/l. No ischemia or gangrene. No cyanosis or clubbing noted b/l. Nonpitting edema noted BLE. Evidence of chronic venous insufficiency b/l LE.   Neurological Examination: Sensation grossly intact b/l with 10 gram monofilament. Vibratory sensation intact b/l. Protective sensation decreased with 10 gram monofilament b/l.  Dermatological Examination: Pedal skin thin, shiny and atrophic b/l. No open wounds. No interdigital macerations.   Toenails 1-5 b/l thick, discolored, elongated with subungual debris and pain on dorsal palpation.   No open wounds b/l LE. No interdigital macerations noted b/l LE. Toenails 1-5 b/l elongated, discolored, dystrophic, thickened, crumbly with subungual debris and tenderness to dorsal palpation. No corns, calluses nor porokeratotic lesions noted.  Musculoskeletal Examination: Muscle strength 5/5 to all lower extremity muscle groups bilaterally. Pes planus deformity noted bilateral LE. Utilizes rollator for ambulation assistance.  Radiographs: None      Lab Results  Component Value Date   HGBA1C 8.6 (H) 01/11/2024   ADA Risk Categorization: High Risk  Patient has one or more of the following: Loss of protective sensation Absent pedal pulses Severe Foot deformity History of foot ulcer  Assessment: 1. Pain due to onychomycosis of toenails of both feet   2. Chronic venous insufficiency   3. Pes planus of both  feet   4. Diabetic peripheral neuropathy associated with type 2 diabetes mellitus (HCC)   5. Encounter for diabetic foot exam (HCC)     Plan: Diabetic foot examination performed today. All patient's and/or POA's questions/concerns addressed on today's visit. Toenails 1-5 debrided in length and girth without incident. Continue foot and shoe inspections daily. Monitor blood glucose per PCP/Endocrinologist's recommendations. Continue soft, supportive shoe gear daily. Report any pedal injuries to medical professional. Call office if there are any questions/concerns. -Patient/POA to call should there be question/concern in the interim. Return in about 3 months (around 08/24/2024).  Luella Sager, DPM      [place] LOCATION: [address] [place] [address]                   Office [phone]   [facility] LOCATION: [address] [place], [place] [address] Office [phone]

## 2024-06-26 ENCOUNTER — Emergency Department (HOSPITAL_COMMUNITY)

## 2024-06-26 ENCOUNTER — Emergency Department (HOSPITAL_COMMUNITY)
Admission: EM | Admit: 2024-06-26 | Discharge: 2024-06-26 | Disposition: A | Discharge status: Home / Self Care | Attending: Emergency Medicine | Admitting: Emergency Medicine

## 2024-06-26 ENCOUNTER — Other Ambulatory Visit: Payer: Self-pay

## 2024-06-26 ENCOUNTER — Encounter (HOSPITAL_COMMUNITY): Payer: Self-pay | Admitting: Emergency Medicine

## 2024-06-26 DIAGNOSIS — Z794 Long term (current) use of insulin: Secondary | ICD-10-CM | POA: Insufficient documentation

## 2024-06-26 DIAGNOSIS — Z7984 Long term (current) use of oral hypoglycemic drugs: Secondary | ICD-10-CM | POA: Insufficient documentation

## 2024-06-26 DIAGNOSIS — M545 Low back pain, unspecified: Secondary | ICD-10-CM | POA: Insufficient documentation

## 2024-06-26 DIAGNOSIS — Z7982 Long term (current) use of aspirin: Secondary | ICD-10-CM | POA: Insufficient documentation

## 2024-06-26 DIAGNOSIS — W19XXXA Unspecified fall, initial encounter: Secondary | ICD-10-CM | POA: Diagnosis not present

## 2024-06-26 DIAGNOSIS — M25552 Pain in left hip: Secondary | ICD-10-CM | POA: Diagnosis present

## 2024-06-26 DIAGNOSIS — S7002XA Contusion of left hip, initial encounter: Secondary | ICD-10-CM | POA: Diagnosis not present

## 2024-06-26 MED ORDER — METHOCARBAMOL 500 MG PO TABS
500.0000 mg | ORAL_TABLET | Freq: Three times a day (TID) | ORAL | 0 refills | Status: AC | PRN
Start: 2024-06-26 — End: ?

## 2024-06-26 MED ORDER — KETOROLAC TROMETHAMINE 15 MG/ML IJ SOLN
15.0000 mg | Freq: Once | INTRAMUSCULAR | Status: AC
  Administered 2024-06-26: 15 mg via INTRAMUSCULAR
  Filled 2024-06-26: qty 1

## 2024-06-26 NOTE — Discharge Instructions (Signed)
 No fracture was seen on your CAT scan.  Follow-up with your doctor as needed.  Continue to take the pain medicines that you are already on

## 2024-06-26 NOTE — ED Notes (Signed)
 Patient transported to X-ray

## 2024-06-26 NOTE — ED Notes (Signed)
 Pt complains of falling a few days ago while using a walker and landed on left hip. Pt complains of lower back pain and left hip pain worsening today. Pt has hx of sciatica. Took oxycodone  at home prior to come to ER.

## 2024-06-26 NOTE — ED Notes (Signed)
 Pt returned from CT

## 2024-06-26 NOTE — ED Notes (Signed)
 Patient transported to CT

## 2024-06-26 NOTE — ED Provider Notes (Signed)
 [place] EMERGENCY DEPARTMENT AT [facility] Provider Note   CSN: [id number] Arrival date & time: 06/26/24  1441     Patient presents with: Back Pain and Hip Pain   Bryan Wells is a 63 y.o. male.    Back Pain Hip Pain  Patient with fall.  Had fall 2 days ago.  No pain in left hip.  Also some pain going down the leg.  History of sciatica.  States the sit down walker he was using broke.  Has been taking oxycodone  without relief.  No numbness or weakness.  No other injury.     Prior to Admission medications   Medication Sig Start Date End Date Taking? Authorizing Provider  methocarbamol  (ROBAXIN ) 500 MG tablet Take 1 tablet (500 mg total) by mouth every 8 (eight) hours as needed for muscle spasms. 06/26/24  Yes Patsey Lot, MD  Accu-Chek Softclix Lancets lancets daily.    [provider]  acetaminophen  (TYLENOL ) 500 MG tablet Take 500 mg by mouth every 6 (six) hours as needed for moderate pain.    [provider]  aspirin EC 81 MG tablet Take 81 mg by mouth daily.    [provider]  atorvastatin  (LIPITOR) 20 MG tablet Take 1 tablet (20 mg total) by mouth daily. 01/26/24 04/25/24  Lelon Hamilton T, PA-C  Coenzyme Q10 (CO Q 10) 100 MG CAPS Take 100 mg by mouth daily. 11/06/20   [provider]  Continuous Blood Gluc Receiver (FREESTYLE LIBRE 2 READER) DEVI See admin instructions. 02/17/22   [provider]  diclofenac Sodium (VOLTAREN) 1 % GEL Apply 1 application topically 4 (four) times daily as needed (pain). 03/28/20   [provider]  diltiazem (CARDIZEM CD) 240 MG 24 hr capsule Take 240 mg by mouth at bedtime.     [provider]  DULoxetine (CYMBALTA) 30 MG capsule Take 30 mg by mouth 2 (two) times daily.    [provider]  gabapentin (NEURONTIN) 600 MG tablet Take 600-1,200 mg by mouth 3 (three) times daily as needed (pain).    [provider]  glimepiride (AMARYL) 2 MG tablet Take 2 mg by  mouth daily with breakfast. 01/29/21   [provider]  glucose blood test strip use to check blood sugars 09/10/18   [provider]  hydrOXYzine (ATARAX) 25 MG tablet Take 25 mg by mouth daily as needed for anxiety. 01/13/22   [provider]  insulin  degludec (TRESIBA FLEXTOUCH) 200 UNIT/ML FlexTouch Pen Inject 60 Units into the skin daily.    [provider]  Insulin  Pen Needle (BD PEN NEEDLE MICRO U/F) 32G X 6 MM MISC See admin instructions.    [provider]  labetalol (NORMODYNE) 300 MG tablet Take 300 mg by mouth daily.    [provider]  metFORMIN (GLUCOPHAGE) 1000 MG tablet Take 1,000 mg by mouth 2 (two) times daily. 09/13/18   [provider]  Needle, Disp, 32G X 5/16 MISC See admin instructions. 08/11/18   [provider]  NONFORMULARY OR COMPOUNDED ITEM Antifungal solution: Terbinafine 3%, Fluconazole 2%, Tea Tree Oil 5%, Urea  10%, Ibuprofen 2% in DMSO suspension #30mL Patient taking differently: as needed. Antifungal solution: Terbinafine 3%, Fluconazole 2%, Tea Tree Oil 5%, Urea  10%, Ibuprofen 2% in DMSO suspension #30mL 05/16/20   Galaway, Delon CROME, DPM  ondansetron  (ZOFRAN -ODT) 4 MG disintegrating tablet Take 1 tablet (4 mg total) by mouth every 6 (six) hours as needed for nausea or vomiting. 01/30/22  Gladis Cough, MD  oxyCODONE -acetaminophen  (PERCOCET) 10-325 MG tablet Take 1 tablet by mouth 4 (four) times daily as needed for pain.    [provider]  pantoprazole  (PROTONIX ) 40 MG tablet Take 1 tablet (40 mg total) by mouth daily. Patient taking differently: Take 40 mg by mouth 2 (two) times daily before a meal. 01/30/22   Gladis Cough, MD  triamcinolone  acetonide (KENALOG -40) 40 MG/ML injection Inject 1 mL into the muscle See admin instructions. Every 6 months as needed for arthritis 11/01/21   [provider]    Allergies: Ciprofloxacin, Hydrochlorothiazide , Ibuprofen, Lisinopril, Nsaids,  Penicillins, and Strawberry (diagnostic)    Review of Systems  Musculoskeletal:  Positive for back pain.    Updated Vital Signs BP (!) 148/79   Pulse 90   Temp 98.5 F (36.9 C)   Resp 18   Ht 6' (1.829 m)   Wt (!) 163 kg   SpO2 100%   BMI 48.74 kg/m   Physical Exam Vitals reviewed.  Cardiovascular:     Rate and Rhythm: Normal rate.  Chest:     Chest wall: No tenderness.  Abdominal:     Tenderness: There is no abdominal tenderness.  Musculoskeletal:        General: Tenderness present.     Comments: No lumbar tenderness.  Does have left hip tenderness laterally.  Decreased range of motion due to the pain.  Difficult exam due to body habitus however.  Skin:    Capillary Refill: Capillary refill takes less than 2 seconds.  Neurological:     Mental Status: He is alert and oriented to person, place, and time.     (all labs ordered are listed, but only abnormal results are displayed) Labs Reviewed - No data to display  EKG: None  Radiology: CT Hip Left Wo Contrast Result Date: 06/26/2024 CLINICAL DATA:  Fall, left hip pain. EXAM: CT OF THE LEFT HIP WITHOUT CONTRAST TECHNIQUE: Multidetector CT imaging of the left hip was performed according to the standard protocol. Multiplanar CT image reconstructions were also generated. RADIATION DOSE REDUCTION: This exam was performed according to the departmental dose-optimization program which includes automated exposure control, adjustment of the mA and/or kV according to patient size and/or use of iterative reconstruction technique. COMPARISON:  Plain films today. FINDINGS: Bones/Joint/Cartilage No acute bony abnormality. Specifically, no fracture, subluxation, or dislocation. Ligaments Suboptimally assessed by CT. Muscles and Tendons Negative Soft tissues Negative IMPRESSION: No acute bony abnormality. Electronically Signed   By: Franky Crease M.D.   On: 06/26/2024 20:53   DG Hip Unilat W or Wo Pelvis 2-3 Views Left Result Date:  06/26/2024 CLINICAL DATA:  Patient fell this past Friday, landing on the left hip. Progressively worsening pain. Walks with a limp. EXAM: DG HIP (WITH OR WITHOUT PELVIS) 2-3V LEFT COMPARISON:  05/01/2021 FINDINGS: Examination is technically limited due to under penetrated technique. As visualized, the pelvis and left hip appear intact. No acute displaced fractures are identified. Mild degenerative changes in the left hip with joint space narrowing and osteophyte formation. Vascular grafts in the pelvis. IMPRESSION: Technically limited study due to technique but no acute displaced fractures are identified in the left hip. Mild degenerative changes. Electronically Signed   By: Elsie Gravely M.D.   On: 06/26/2024 19:24     Procedures   Medications Ordered in the ED  ketorolac  (TORADOL ) 15 MG/ML injection 15 mg (15 mg Intramuscular Given 06/26/24 2052)  Medical Decision Making Amount and/or Complexity of Data Reviewed Radiology: ordered.  Risk Prescription drug management.   Patient with fall.  Left hip pain.  Differential diagnosis includes bruises and fractures.  Dislocations also considered.  Lumbar spine nontender will not image.  X-ray limited due to body habitus.  With tenderness laterally will get CT scan.  Will give single dose of Toradol .  Should help with pain and overall I think low risk even with his gastric bypass.  CT scan reassuring.  Will discharge home.  Will give muscle relaxer since he already is on high-dose opiates.     Final diagnoses:  Contusion of left hip, initial encounter    ED Discharge Orders          Ordered    methocarbamol  (ROBAXIN ) 500 MG tablet  Every 8 hours PRN        06/26/24 2148               Patsey Lot, MD 06/26/24 2244

## 2024-06-26 NOTE — ED Triage Notes (Signed)
 Pt had a fall this past Friday and landed on his left hip. Pt was able to get up under his own power. But the patients pain progressively got worse and now the patient walks with a limp. Pt is alert and oriented x4.

## 2024-08-23 ENCOUNTER — Encounter (HOSPITAL_COMMUNITY): Payer: Self-pay | Admitting: *Deleted

## 2024-08-31 ENCOUNTER — Ambulatory Visit: Admitting: Podiatry

## 2024-08-31 ENCOUNTER — Other Ambulatory Visit: Payer: Self-pay | Admitting: Internal Medicine

## 2024-08-31 DIAGNOSIS — I7143 Infrarenal abdominal aortic aneurysm, without rupture: Secondary | ICD-10-CM

## 2024-09-06 ENCOUNTER — Ambulatory Visit
Admission: RE | Admit: 2024-09-06 | Discharge: 2024-09-06 | Disposition: A | Discharge status: Home / Self Care | Source: Ambulatory Visit | Attending: Internal Medicine | Admitting: Internal Medicine

## 2024-09-06 DIAGNOSIS — I7143 Infrarenal abdominal aortic aneurysm, without rupture: Secondary | ICD-10-CM

## 2025-01-30 ENCOUNTER — Ambulatory Visit: Admitting: Podiatry
